# Patient Record
Sex: Female | Born: 1935 | Race: Black or African American | Hispanic: No | Marital: Single | Smoking: Never smoker
Health system: Southern US, Community
[De-identification: ages and names within clinical notes are randomized; demographics above are authoritative.]

## PROBLEM LIST (undated history)

## (undated) DIAGNOSIS — E669 Obesity, unspecified: Secondary | ICD-10-CM

## (undated) DIAGNOSIS — I509 Heart failure, unspecified: Secondary | ICD-10-CM

## (undated) DIAGNOSIS — E8881 Metabolic syndrome: Secondary | ICD-10-CM

## (undated) DIAGNOSIS — K219 Gastro-esophageal reflux disease without esophagitis: Secondary | ICD-10-CM

## (undated) DIAGNOSIS — R42 Dizziness and giddiness: Secondary | ICD-10-CM

## (undated) DIAGNOSIS — R011 Cardiac murmur, unspecified: Secondary | ICD-10-CM

## (undated) DIAGNOSIS — I10 Essential (primary) hypertension: Secondary | ICD-10-CM

## (undated) DIAGNOSIS — E7800 Pure hypercholesterolemia, unspecified: Secondary | ICD-10-CM

## (undated) DIAGNOSIS — I82409 Acute embolism and thrombosis of unspecified deep veins of unspecified lower extremity: Secondary | ICD-10-CM

## (undated) DIAGNOSIS — J301 Allergic rhinitis due to pollen: Secondary | ICD-10-CM

## (undated) DIAGNOSIS — I4891 Unspecified atrial fibrillation: Secondary | ICD-10-CM

## (undated) DIAGNOSIS — E88819 Insulin resistance, unspecified: Secondary | ICD-10-CM

## (undated) HISTORY — DX: Metabolic syndrome: E88.81

## (undated) HISTORY — DX: Insulin resistance, unspecified: E88.819

## (undated) HISTORY — DX: Allergic rhinitis due to pollen: J30.1

## (undated) HISTORY — DX: Dizziness and giddiness: R42

## (undated) HISTORY — PX: BREAST BIOPSY: SHX20

## (undated) HISTORY — DX: Heart failure, unspecified: I50.9

## (undated) HISTORY — PX: APPENDECTOMY: SHX54

## (undated) HISTORY — PX: PARTIAL HYSTERECTOMY: SHX80

## (undated) HISTORY — PX: CHOLECYSTECTOMY: SHX55

## (undated) HISTORY — DX: Obesity, unspecified: E66.9

## (undated) HISTORY — PX: ANKLE FRACTURE SURGERY: SHX122

## (undated) HISTORY — DX: Unspecified atrial fibrillation: I48.91

## (undated) HISTORY — DX: Pure hypercholesterolemia, unspecified: E78.00

## (undated) HISTORY — DX: Essential (primary) hypertension: I10

## (undated) HISTORY — DX: Acute embolism and thrombosis of unspecified deep veins of unspecified lower extremity: I82.409

---

## 1998-01-19 ENCOUNTER — Ambulatory Visit (HOSPITAL_COMMUNITY): Admission: RE | Admit: 1998-01-19 | Discharge: 1998-01-19 | Payer: Self-pay | Admitting: Internal Medicine

## 1998-01-31 ENCOUNTER — Ambulatory Visit (HOSPITAL_COMMUNITY): Admission: RE | Admit: 1998-01-31 | Discharge: 1998-01-31 | Payer: Self-pay | Admitting: Internal Medicine

## 2000-04-24 ENCOUNTER — Encounter: Admission: RE | Admit: 2000-04-24 | Discharge: 2000-04-24 | Payer: Self-pay | Admitting: Internal Medicine

## 2000-04-24 ENCOUNTER — Other Ambulatory Visit: Admission: RE | Admit: 2000-04-24 | Discharge: 2000-04-24 | Payer: Self-pay | Admitting: Internal Medicine

## 2000-04-24 ENCOUNTER — Encounter: Payer: Self-pay | Admitting: Internal Medicine

## 2000-05-01 ENCOUNTER — Encounter: Payer: Self-pay | Admitting: Internal Medicine

## 2000-05-01 ENCOUNTER — Encounter: Admission: RE | Admit: 2000-05-01 | Discharge: 2000-05-01 | Payer: Self-pay | Admitting: Internal Medicine

## 2000-07-10 ENCOUNTER — Encounter (INDEPENDENT_AMBULATORY_CARE_PROVIDER_SITE_OTHER): Payer: Self-pay | Admitting: *Deleted

## 2000-07-10 ENCOUNTER — Ambulatory Visit (HOSPITAL_COMMUNITY): Admission: RE | Admit: 2000-07-10 | Discharge: 2000-07-10 | Payer: Self-pay | Admitting: Gastroenterology

## 2001-10-12 ENCOUNTER — Encounter: Admission: RE | Admit: 2001-10-12 | Discharge: 2002-01-10 | Payer: Self-pay | Admitting: Internal Medicine

## 2002-03-09 ENCOUNTER — Encounter: Payer: Self-pay | Admitting: Internal Medicine

## 2002-03-09 ENCOUNTER — Encounter: Admission: RE | Admit: 2002-03-09 | Discharge: 2002-03-09 | Payer: Self-pay | Admitting: Internal Medicine

## 2002-04-26 ENCOUNTER — Encounter: Payer: Self-pay | Admitting: Internal Medicine

## 2002-04-26 ENCOUNTER — Encounter: Admission: RE | Admit: 2002-04-26 | Discharge: 2002-04-26 | Payer: Self-pay | Admitting: Internal Medicine

## 2002-05-13 ENCOUNTER — Encounter: Admission: RE | Admit: 2002-05-13 | Discharge: 2002-08-11 | Payer: Self-pay | Admitting: Family Medicine

## 2004-04-12 ENCOUNTER — Ambulatory Visit (HOSPITAL_COMMUNITY): Admission: RE | Admit: 2004-04-12 | Discharge: 2004-04-12 | Payer: Self-pay | Admitting: Gastroenterology

## 2005-01-18 ENCOUNTER — Encounter: Admission: RE | Admit: 2005-01-18 | Discharge: 2005-01-18 | Payer: Self-pay | Admitting: Internal Medicine

## 2005-08-16 ENCOUNTER — Other Ambulatory Visit: Admission: RE | Admit: 2005-08-16 | Discharge: 2005-08-16 | Payer: Self-pay | Admitting: Internal Medicine

## 2006-03-07 ENCOUNTER — Encounter: Admission: RE | Admit: 2006-03-07 | Discharge: 2006-03-07 | Payer: Self-pay | Admitting: Internal Medicine

## 2006-05-02 ENCOUNTER — Encounter: Payer: Self-pay | Admitting: Vascular Surgery

## 2006-05-02 ENCOUNTER — Ambulatory Visit (HOSPITAL_COMMUNITY): Admission: RE | Admit: 2006-05-02 | Discharge: 2006-05-02 | Payer: Self-pay | Admitting: Internal Medicine

## 2007-06-20 ENCOUNTER — Emergency Department (HOSPITAL_COMMUNITY): Admission: EM | Admit: 2007-06-20 | Discharge: 2007-06-20 | Payer: Self-pay | Admitting: Emergency Medicine

## 2007-10-06 ENCOUNTER — Ambulatory Visit (HOSPITAL_COMMUNITY): Admission: RE | Admit: 2007-10-06 | Discharge: 2007-10-06 | Payer: Self-pay | Admitting: Internal Medicine

## 2008-10-13 ENCOUNTER — Ambulatory Visit (HOSPITAL_COMMUNITY): Admission: RE | Admit: 2008-10-13 | Discharge: 2008-10-13 | Payer: Self-pay | Admitting: Internal Medicine

## 2008-11-17 ENCOUNTER — Encounter: Admission: RE | Admit: 2008-11-17 | Discharge: 2008-11-17 | Payer: Self-pay | Admitting: Internal Medicine

## 2009-10-23 ENCOUNTER — Ambulatory Visit (HOSPITAL_COMMUNITY): Admission: RE | Admit: 2009-10-23 | Discharge: 2009-10-23 | Payer: Self-pay | Admitting: Internal Medicine

## 2010-11-09 ENCOUNTER — Ambulatory Visit (HOSPITAL_COMMUNITY)
Admission: RE | Admit: 2010-11-09 | Discharge: 2010-11-09 | Payer: Self-pay | Source: Home / Self Care | Admitting: Internal Medicine

## 2011-03-21 ENCOUNTER — Encounter: Payer: Self-pay | Admitting: Internal Medicine

## 2011-03-26 ENCOUNTER — Ambulatory Visit
Admission: RE | Admit: 2011-03-26 | Discharge: 2011-03-26 | Disposition: A | Discharge status: Home / Self Care | Payer: Medicare Other | Source: Ambulatory Visit | Attending: Internal Medicine | Admitting: Internal Medicine

## 2011-03-26 ENCOUNTER — Other Ambulatory Visit: Payer: Self-pay | Admitting: Internal Medicine

## 2011-03-26 DIAGNOSIS — R52 Pain, unspecified: Secondary | ICD-10-CM

## 2011-04-26 NOTE — Op Note (Signed)
NAME:  Jasmine [name], Jasmine [name]                        ACCOUNT NO.:  [id number]   MEDICAL RECORD NO.:  [id number]                   PATIENT TYPE:  AMB   LOCATION:  ENDO                                 FACILITY:  [facility]   PHYSICIAN:  Ronald Lobo, M.D.                DATE OF BIRTH:  [birth year]   DATE OF PROCEDURE:  04/12/2004  DATE OF DISCHARGE:                                 OPERATIVE REPORT   PROCEDURE:  Colonoscopy.   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   FINDINGS:  Minimal diverticulosis.   DESCRIPTION OF PROCEDURE:  The nature, purpose and risk of the procedure  were familiar to the patient from prior examination and she provided written  consent. Sedation was fentanyl 75 mcg and Versed 8 mg IV without arrhythmias  other than some transient sinus arrhythmia or perhaps an occasional dropped  beat.  No clinical instability, no desaturation.   The Olympus adult video colonoscope was used for this exam and things went  smoother than on her exam three years ago when there was a lot of looping  with the pediatric scope.  I did have to go around a tight angulation in the  sigmoid region and had to take out loops during advancement but then things  went easily through the cecum which was identified by clear visualization of  the appendiceal orifice.  Pullback was then performed. The quality of the  prep was excellent and it is felt that all areas were well seen.   There was some mild diverticulosis noted during insertion of the scope but  not really visible during extraction.   No polyps, cancer, colitis or vascular malformations were observed.  Retroflexion in the rectum and reinspection of the rectum was unremarkable.  No biopsies were obtained. The patient tolerated the procedure well and  there were no apparent complications.   Note that the site of the rectal polypectomy where the carcinoid tumor was  removed, at the  time of her colonoscopy three years ago, looked intact.  There was a white scar there but no evidence of any residual tissue.   IMPRESSION:  1. Prior history of benign colonic polypoid grown, without any current     polyps identified (V12.72).  2. Mild diverticulosis.   PLAN:  Followup colonoscopy in five years.                                               Ronald Lobo, M.D.    RB/MEDQ  D:  04/12/2004  T:  04/12/2004  Job:  [id number]   cc:   Randall Hiss L. Marlou Sa, M.D.  [address] [place] [address]  Fax: [phone]

## 2011-04-26 NOTE — Procedures (Signed)
[facility]  Patient:    Jasmine [name], Jasmine [name]                     MRN: [id number] Proc. Date: 07/10/00 Adm. Date:  [id number] Disc. Date: [id number] Attending:  Ernie Avena CC:         Carolann Littler. Marlou Sa, M.D.   Procedure Report  PROCEDURE:  Colonoscopy with polypectomy.  ENDOSCOPIST:  Cleotis Nipper, M.D.  INDICATIONS:  Intermittent small volume hematochezia in a 77 year old female, also for colon cancer screening.  FINDINGS:  A 6.0 mm sessile rectal polyp removed by snare technique.  Rare right-sided diverticulum, otherwise normal examination.  DESCRIPTION OF PROCEDURE:  The nature, purpose, and risks of the procedure have been discussed with the patient who provided a written consent.  Sedation was fentanyl 100 mcg and Versed 7 mg IV, without arrhythmias or desaturation. The Olympus pediatric video colonoscope was advanced with some difficulty, due to looping and fairly sharp angulation, especially at the hepatic flexure, but with having the patient in the supine position, and applying some external abdominal compression, we were eventually able to override some loops, get around the hepatic flexure, and advance to the cecum, as identified by the visualization of the appendiceal orifice and the absence of further lumen.  A pullback was then performed.  The quality of the prep was excellent.  It is felt that all areas were well-seen.  There was a 6.0 mm sessile polyp which I snared off at about 8.0 cm from the external anal opening.  No other polyps were seen, and there was no evidence of cancer, colitis, or vascular malformations.  Retroflexion in the rectum prior to the retroflexion was not really successful, in that the view was obscured by one of the valves of [place].  Antegrade viewing disclosed just a slight loss of vascularity in the distal rectum, but no really worrisome abnormalities such as granularity or exudate.  The polyp  was retrieved by withdrawing it along the scope, out through the anus.  It was sent for histologic analysis.  The patient tolerated this procedure well, and there were no apparent complications.  It should be noted that the eschar from the polypectomy was relatively deep, but did not appear to be excessively deep.  IMPRESSION: 1. Rectal polyp, snared from the midrectum. 2. Rare right-sided diverticula.  PLAN:  Await pathology on the polyp.  The source of the patients intermittent rectal bleeding is felt to most likely be hemorrhoids or an anal fissure, since the polyp did not look hemorrhagic in character, and no other obvious source of hematochezia was identified. DD:  07/10/00 TD:  07/12/00 Job: [id number]

## 2011-09-24 LAB — DIFFERENTIAL
Basophils Relative: 0
Eosinophils Absolute: 0.1
Lymphocytes Relative: 22
Lymphs Abs: 1.4
Monocytes Absolute: 0.3
Neutro Abs: 4.5
Neutrophils Relative %: 71

## 2011-09-24 LAB — I-STAT 8, (EC8 V) (CONVERTED LAB)
Acid-Base Excess: 2
Bicarbonate: 24.7 — ABNORMAL HIGH
Chloride: 106
HCT: 37
Hemoglobin: 12.6
Operator id: 257131
Potassium: 4.4
TCO2: 26
pCO2, Ven: 30.2 — ABNORMAL LOW
pH, Ven: 7.52 — ABNORMAL HIGH

## 2011-09-24 LAB — POCT I-STAT CREATININE
Creatinine, Ser: 1.1
Operator id: 257131

## 2011-09-24 LAB — CBC
MCHC: 34.2
MCV: 91.3
Platelets: 190

## 2011-12-23 ENCOUNTER — Inpatient Hospital Stay (HOSPITAL_COMMUNITY)
Admission: EM | Admit: 2011-12-23 | Discharge: 2011-12-26 | DRG: 310 | Disposition: A | Discharge status: Home / Self Care | Payer: Medicare Other | Attending: Internal Medicine | Admitting: Internal Medicine

## 2011-12-23 ENCOUNTER — Other Ambulatory Visit: Payer: Self-pay

## 2011-12-23 ENCOUNTER — Emergency Department (HOSPITAL_COMMUNITY): Payer: Medicare Other

## 2011-12-23 ENCOUNTER — Encounter (HOSPITAL_COMMUNITY): Payer: Self-pay

## 2011-12-23 DIAGNOSIS — Z886 Allergy status to analgesic agent status: Secondary | ICD-10-CM

## 2011-12-23 DIAGNOSIS — D638 Anemia in other chronic diseases classified elsewhere: Secondary | ICD-10-CM | POA: Diagnosis present

## 2011-12-23 DIAGNOSIS — R040 Epistaxis: Secondary | ICD-10-CM | POA: Diagnosis present

## 2011-12-23 DIAGNOSIS — R42 Dizziness and giddiness: Secondary | ICD-10-CM

## 2011-12-23 DIAGNOSIS — I4891 Unspecified atrial fibrillation: Secondary | ICD-10-CM

## 2011-12-23 DIAGNOSIS — I509 Heart failure, unspecified: Secondary | ICD-10-CM | POA: Diagnosis present

## 2011-12-23 DIAGNOSIS — H8110 Benign paroxysmal vertigo, unspecified ear: Secondary | ICD-10-CM | POA: Diagnosis present

## 2011-12-23 DIAGNOSIS — I129 Hypertensive chronic kidney disease with stage 1 through stage 4 chronic kidney disease, or unspecified chronic kidney disease: Secondary | ICD-10-CM | POA: Diagnosis present

## 2011-12-23 DIAGNOSIS — Z79899 Other long term (current) drug therapy: Secondary | ICD-10-CM

## 2011-12-23 DIAGNOSIS — N181 Chronic kidney disease, stage 1: Secondary | ICD-10-CM | POA: Diagnosis present

## 2011-12-23 DIAGNOSIS — D649 Anemia, unspecified: Secondary | ICD-10-CM

## 2011-12-23 DIAGNOSIS — Z882 Allergy status to sulfonamides status: Secondary | ICD-10-CM

## 2011-12-23 DIAGNOSIS — E7800 Pure hypercholesterolemia, unspecified: Secondary | ICD-10-CM | POA: Diagnosis present

## 2011-12-23 DIAGNOSIS — J309 Allergic rhinitis, unspecified: Secondary | ICD-10-CM | POA: Diagnosis present

## 2011-12-23 DIAGNOSIS — Z7982 Long term (current) use of aspirin: Secondary | ICD-10-CM

## 2011-12-23 DIAGNOSIS — K219 Gastro-esophageal reflux disease without esophagitis: Secondary | ICD-10-CM | POA: Diagnosis present

## 2011-12-23 HISTORY — DX: Gastro-esophageal reflux disease without esophagitis: K21.9

## 2011-12-23 HISTORY — DX: Cardiac murmur, unspecified: R01.1

## 2011-12-23 HISTORY — DX: Unspecified atrial fibrillation: I48.91

## 2011-12-23 LAB — RETICULOCYTES
RBC.: 3.6 MIL/uL — ABNORMAL LOW (ref 3.87–5.11)
Retic Count, Absolute: 93.6 10*3/uL (ref 19.0–186.0)

## 2011-12-23 LAB — BASIC METABOLIC PANEL
Calcium: 9.4 mg/dL (ref 8.4–10.5)
Chloride: 101 mEq/L (ref 96–112)
Creatinine, Ser: 1.11 mg/dL — ABNORMAL HIGH (ref 0.50–1.10)
GFR calc Af Amer: 55 mL/min — ABNORMAL LOW (ref >90)
Potassium: 3.8 mEq/L (ref 3.5–5.1)
Sodium: 134 mEq/L — ABNORMAL LOW (ref 135–145)

## 2011-12-23 LAB — POCT I-STAT TROPONIN I: Troponin i, poc: 0 ng/mL (ref 0.00–0.08)

## 2011-12-23 LAB — T3, FREE: T3, Free: 2.5 pg/mL (ref 2.3–4.2)

## 2011-12-23 LAB — DIFFERENTIAL
Eosinophils Relative: 1 % (ref 0–5)
Lymphs Abs: 1.1 10*3/uL (ref 0.7–4.0)
Monocytes Absolute: 0.2 10*3/uL (ref 0.1–1.0)
Monocytes Relative: 3 % (ref 3–12)
Neutro Abs: 4.5 10*3/uL (ref 1.7–7.7)
Neutrophils Relative %: 78 % — ABNORMAL HIGH (ref 43–77)

## 2011-12-23 LAB — CBC
MCH: 30.4 pg (ref 26.0–34.0)
RBC: 3.58 MIL/uL — ABNORMAL LOW (ref 3.87–5.11)
RDW: 14.1 % (ref 11.5–15.5)

## 2011-12-23 LAB — PRO B NATRIURETIC PEPTIDE: Pro B Natriuretic peptide (BNP): 1113 pg/mL — ABNORMAL HIGH (ref 0–450)

## 2011-12-23 LAB — HEPATIC FUNCTION PANEL
ALT: 9 U/L (ref 0–35)
AST: 15 U/L (ref 0–37)
Albumin: 3.2 g/dL — ABNORMAL LOW (ref 3.5–5.2)
Bilirubin, Direct: 0.1 mg/dL (ref 0.0–0.3)
Total Bilirubin: 0.6 mg/dL (ref 0.3–1.2)

## 2011-12-23 LAB — T4, FREE: Free T4: 0.79 ng/dL — ABNORMAL LOW (ref 0.80–1.80)

## 2011-12-23 LAB — IRON AND TIBC
Iron: 34 ug/dL — ABNORMAL LOW (ref 42–135)
UIBC: 283 ug/dL (ref 125–400)

## 2011-12-23 LAB — TSH: TSH: 2.795 u[IU]/mL (ref 0.350–4.500)

## 2011-12-23 LAB — FERRITIN: Ferritin: 326 ng/mL — ABNORMAL HIGH (ref 10–291)

## 2011-12-23 LAB — FOLATE: Folate: 13.4 ng/mL

## 2011-12-23 MED ORDER — ONDANSETRON HCL 4 MG PO TABS: 4.0000 mg | ORAL_TABLET | Freq: Four times a day (QID) | ORAL | Status: DC | PRN

## 2011-12-23 MED ORDER — ONDANSETRON HCL 4 MG/2ML IJ SOLN
4.0000 mg | Freq: Once | INTRAMUSCULAR | Status: AC
  Administered 2011-12-23: 4 mg via INTRAVENOUS
  Filled 2011-12-23: qty 2

## 2011-12-23 MED ORDER — ATENOLOL 100 MG PO TABS
100.0000 mg | ORAL_TABLET | Freq: Every day | ORAL | Status: DC
  Administered 2011-12-24: 100 mg via ORAL
  Filled 2011-12-23 (×2): qty 1

## 2011-12-23 MED ORDER — LORATADINE 10 MG PO TABS
10.0000 mg | ORAL_TABLET | Freq: Every day | ORAL | Status: DC
  Administered 2011-12-24 – 2011-12-26 (×3): 10 mg via ORAL
  Filled 2011-12-23 (×4): qty 1

## 2011-12-23 MED ORDER — SODIUM CHLORIDE 0.9 % IV BOLUS (SEPSIS)
500.0000 mL | Freq: Once | INTRAVENOUS | Status: AC
  Administered 2011-12-23: 500 mL via INTRAVENOUS

## 2011-12-23 MED ORDER — MECLIZINE HCL 12.5 MG PO TABS
12.5000 mg | ORAL_TABLET | Freq: Three times a day (TID) | ORAL | Status: DC | PRN
  Filled 2011-12-23: qty 1

## 2011-12-23 MED ORDER — OXYCODONE HCL 5 MG PO TABS: 5.0000 mg | ORAL_TABLET | Freq: Four times a day (QID) | ORAL | Status: DC | PRN

## 2011-12-23 MED ORDER — ASPIRIN 325 MG PO TABS
325.0000 mg | ORAL_TABLET | Freq: Every day | ORAL | Status: DC
  Administered 2011-12-23 – 2011-12-26 (×4): 325 mg via ORAL
  Filled 2011-12-23 (×4): qty 1

## 2011-12-23 MED ORDER — ACETAMINOPHEN 650 MG RE SUPP: 650.0000 mg | Freq: Four times a day (QID) | RECTAL | Status: DC | PRN

## 2011-12-23 MED ORDER — ATENOLOL 50 MG PO TABS
50.0000 mg | ORAL_TABLET | Freq: Every day | ORAL | Status: DC
  Administered 2011-12-23 – 2011-12-24 (×2): 50 mg via ORAL
  Filled 2011-12-23 (×3): qty 1

## 2011-12-23 MED ORDER — ENOXAPARIN SODIUM 40 MG/0.4ML ~~LOC~~ SOLN
40.0000 mg | SUBCUTANEOUS | Status: DC
  Administered 2011-12-24 – 2011-12-25 (×2): 40 mg via SUBCUTANEOUS
  Filled 2011-12-23 (×4): qty 0.4

## 2011-12-23 MED ORDER — MECLIZINE HCL 25 MG PO TABS
50.0000 mg | ORAL_TABLET | Freq: Once | ORAL | Status: AC
  Administered 2011-12-23: 50 mg via ORAL
  Filled 2011-12-23: qty 2

## 2011-12-23 MED ORDER — ACETAMINOPHEN 325 MG PO TABS: 650.0000 mg | ORAL_TABLET | Freq: Four times a day (QID) | ORAL | Status: DC | PRN

## 2011-12-23 MED ORDER — LEVALBUTEROL HCL 0.63 MG/3ML IN NEBU
0.6300 mg | INHALATION_SOLUTION | Freq: Four times a day (QID) | RESPIRATORY_TRACT | Status: DC
  Administered 2011-12-23 – 2011-12-24 (×2): 0.63 mg via RESPIRATORY_TRACT
  Filled 2011-12-23 (×7): qty 3

## 2011-12-23 MED ORDER — DOCUSATE SODIUM 100 MG PO CAPS
100.0000 mg | ORAL_CAPSULE | Freq: Two times a day (BID) | ORAL | Status: DC
  Administered 2011-12-23 – 2011-12-26 (×6): 100 mg via ORAL
  Filled 2011-12-23 (×7): qty 1

## 2011-12-23 MED ORDER — ONDANSETRON HCL 4 MG/2ML IJ SOLN: 4.0000 mg | Freq: Four times a day (QID) | INTRAMUSCULAR | Status: DC | PRN

## 2011-12-23 MED ORDER — DIAZEPAM 5 MG/ML IJ SOLN
2.5000 mg | Freq: Once | INTRAMUSCULAR | Status: AC
  Administered 2011-12-23: 2.5 mg via INTRAVENOUS
  Filled 2011-12-23: qty 2

## 2011-12-23 MED ORDER — OLMESARTAN 10 MG HALF TABLET
10.0000 mg | ORAL_TABLET | Freq: Every day | ORAL | Status: DC
  Administered 2011-12-23 – 2011-12-26 (×3): 10 mg via ORAL
  Filled 2011-12-23 (×4): qty 1

## 2011-12-23 MED ORDER — SALINE NASAL SPRAY 0.65 % NA SOLN
1.0000 | Freq: Every day | NASAL | Status: DC | PRN
  Filled 2011-12-23: qty 0.5

## 2011-12-23 MED ORDER — SODIUM CHLORIDE 0.9 % IV SOLN
INTRAVENOUS | Status: AC
  Administered 2011-12-23: 21:00:00 via INTRAVENOUS

## 2011-12-23 MED ORDER — GADOBENATE DIMEGLUMINE 529 MG/ML IV SOLN
15.0000 mL | Freq: Once | INTRAVENOUS | Status: AC
  Administered 2011-12-23: 15 mL via INTRAVENOUS

## 2011-12-23 NOTE — Consult Note (Signed)
CARDIOLOGY CONSULT NOTE   Patient ID: Jasmine [name] MRN: [id number] DOB/AGE: 07-25-1934 78 y.o.  Admit date: 12/23/2011  Primary Physician   Dr Marlou Sa Primary Cardiologist   None  Reason for Consultation   Atrial fib  [id number] Jasmine [name] is a 78 y.o. female with no history of CAD.   She was in her usual state of health yesterday. She woke at approximately 1 AM with severe dizziness. She also had nausea and vomiting. She felt very weak. The dizziness was made worse by any movement, especially of her head. She blew her nose and had some epistaxis from the left nostril which she has had before. She did not have chest pain or shortness of breath. She had no awareness of an irregular or rapid heart rate. She did not have any pain. When she was unable to walk because of the dizziness and weakness, her sister called EMS. She was found to be in atrial fibrillation with rapid ventricular response. The emergency room is also evaluating her for possible CVA. She has had 500cc IVF, Zofran and Antivert 50mg . Cardiology was asked to evaluate her.  Jasmine [name] has never had any chest pain. She is active around her home and does exercise as well as goes dancing and has no problems with shortness of breath or chest pain. She has rare palpitations but no symptoms from them, and never had any problems with dizziness until today. Her symptoms are slightly improved but she still has significant dizziness with any movement.   Past Medical History  Diagnosis Date  . Allergic rhinitis due to pollen   . Essential hypertension, malignant   . Congestive heart failure, unspecified   . Pure hypercholesterolemia     Past Surgical History  Procedure Date  . Cholecystectomy   . Partial hysterectomy     Allergies  Allergen Reactions  . Codeine   . Sulfa Antibiotics    I have reviewed the patient's current medications. Prior to Admission:  1. Atenolol 100 mg daily 2. Allegra 180 mg daily 3. Avapro 300 mg  daily 4. Saline nasal spray to as needed 5. Maxzide 75/50 mg daily  Scheduled Meds:   . meclizine  50 mg Oral Once  . ondansetron  4 mg Intravenous Once  . sodium chloride  500 mL Intravenous Once    History   Social History  . Marital Status: Single    Spouse Name: N/A    Number of Children: N/A  . Years of Education: N/A   Occupational History  .  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    Social History Main Topics  . Smoking status: Never Smoker   . Smokeless tobacco: Not on file  . Alcohol Use: Never  . Drug Use: No  . Sexually Active:    Social History Narrative  .  She lives with her sister. She drives. She has no problems taking care of her needs.     Family history. Both parents are deceased. Her father died from complications of diabetes. Her mother died at 69 with congestive heart failure. Neither the patient, her twin sister, or any other siblings have cardiac issues.  ROS: She has occasional nosebleeds from her left nostril when she sneezes or blows nose. These are helped somewhat by the saline nasal spray. She feels that this happens more often during her seasonal allergy flares. She has occasional musculoskeletal aches and pains. She had reflux symptoms in the past  and took medication for while but stated she made dietary changes and now no longer has any symptoms except when she eats spicy foods. She has never had melena, hematemesis or hemoptysis. She has not had any recent illnesses fevers or chills. She does not have history of presyncope or syncope. 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 Full 14 point review of systems complete and found to be negative unless listed  above  Physical Exam: Blood pressure 132/75, pulse 77, temperature 97.5 F (36.4 C), temperature source Oral, resp. rate 23, SpO2 100.00%.   General: Well developed, well nourished, elderly African-American female in no acute distress Head:  Eyes PERRLA, No xanthomas.   Normocephalic and atraumatic, oropharynx without edema or exudate. Ptosis noted on right eye. Dentition good Lungs: Clear bilaterally to auscultation  Heart:  Heart irregular rate and rhythm with S1, S2, no significant murmur, rub or gallop. pulses are 2+ & equal all 4 extrem.   Neck: No carotid bruit bilaterally.   No lymphadenopathy.  JVD at 6 cm. Abdomen: Bowel sounds present, abdomen soft and non-tender without masses or hernias noted. Msk:  No spine or cva tenderness. Normal strength and tone for age, no joint deformities or effusions. Extremities: No clubbing or cyanosis.  No edema.  Neuro: Alert and oriented X 3. No focal deficits noted. Psych:  Good affect, responds appropriately Skin: No rashes or lesions noted.  Labs:   Lab Results  Component Value Date   WBC 5.8 12/23/2011   HGB 10.9* 12/23/2011   HCT 32.5* 12/23/2011   MCV 90.8 12/23/2011   PLT 177 12/23/2011    Lab 12/23/11 0903  NA 134*  K 3.8  CL 101  CO2 18*  BUN 26*  CREATININE 1.11*  CALCIUM 9.4  PROT --  BILITOT --  ALKPHOS --  ALT --  AST --  GLUCOSE 177*   No results found for this basename: CKTOTAL:4,CKMB:4,TROPONINI:4 in the last 72 hours No components found with this basename: POCBNP:3 No results found for this basename: CHOL, HDL, LDLCALC, TRIG   No results found for this basename: DDIMER   No results found for this basename: Lipase, amylase   No results found for this basename: tsh, t4total, freet3, t68free, thyroidab   No results found for this basename: VitaminB12, Folate, Ferritin, TIBC, Iron, reticctpct   Radiology:  Dg Chest Portable 1 View  12/23/2011  *RADIOLOGY REPORT*  Clinical Data: Atrial fibrillation.  Dizziness. Nausea and vomiting.  Epistaxis.  PORTABLE CHEST - 1 VIEW  Comparison: 11/17/2008  Findings: Low lung volumes again demonstrated.  Both lungs remain clear.  Heart size is stable.  No evidence of pleural effusion. Patient is partially rotated to the  left.  IMPRESSION: Low lung volumes.  No acute findings.  Original Report Authenticated By: Marlaine Hind, M.D.    EKG:  Atrial fibrillation, rate 109 with no acute ischemic changes. Previous ECG from 2008 with sinus rhythm  ASSESSMENT AND PLAN:   The patient was seen today by Dr Stanford Breed, the patient evaluated and the data reviewed.  1. rapid atrial fibrillation: The patient chronically takes atenolol 100 mg daily. Consider changing this to Lopressor 25 mg every 6 hours. She can also have IV Cardizem, titrated for better rate control. The duration is unclear but if the atrial fibrillation is causing her dizziness, it is less than 12 hours old. Need thyroid function tests. 2. Dizziness: She has an MRI of the brain ordered for evaluation of possible  CVA but the suspicion is not strong. She has been started on Antivert. We will hold the diuretic for now. The symptoms are possibly vestibular in origin if they are not secondary to her atrial fibrillation. 3. Anticoagulation: She would be a candidate for anticoagulation with heparin/Coumadin or Pradaxa/Xarelto if the MRI is negative and if she has no active bleeding issues. 4. Anemia - will do Iron studies and guiac stools. With her epistaxis history, may need ENT to see.  Signed: Rosaria Ferries 12/23/2011, 10:49 AM Co-Sign MD As above 78 year old female with no prior cardiac history for evaluation of atrial fibrillation. She typically does not have dyspnea on exertion, orthopnea, PND or chest pain. She occasionally feels brief palpitations that this does not appear to be significantly problematic. At approximately 1 AM today she awoke with acute dizziness followed by nausea and vomiting. Her dizziness increased with movements and she describes the room as "spinning". She has not had fevers, chills, cough, chest pain, palpitations or dyspnea. She denies loss of strength or sensation in her extremities. She presented to the emergency room and was noted to be  in atrial fibrillation and cardiology was asked to evaluate. Her physical exam is significant for an irregular heart rhythm. There are no gross neurological findings. She does have ptosis chronically of her right eye. Her laboratories are significant for a normocytic anemia. Her electrocardiogram shows atrial fibrillation at a rate of 109. Patient converted to sinus rhythm during my examination. Plan to continue atenolol which she takes for her blood pressure but increase dose to 100 mg in the morning and 50 mg in the evening for improved rate control. Outpatient echocardiogram to quantify LV function. Check TSH. She has embolic risk factors of hypertension, female sex, and age greater than 77. She therefore will need long-term anticoagulation. However she has significant problems with recurrent nosebleeds. I will have her evaluated by ENT to see if there is a lesion that will require cauterization. We will also check anemia labs given her baseline normocytic anemia. Note she denies melena or hematochezia. Check stool hemocults. Once these issues are resolved we will begin also 20 mg daily. For now I will treat with aspirin 81 mg daily. She will followup with me in the office 2-4 weeks from now. Note she is felt to most likely have vertigo. However an MRI has been scheduled to exclude CVA. If she indeed does have note of a CVA on her MRI then she will need admission and evaluation by neurology. Kirk Ruths

## 2011-12-23 NOTE — ED Notes (Signed)
[id number] Ready

## 2011-12-23 NOTE — ED Provider Notes (Signed)
Medical screening examination/treatment/procedure(s) were conducted as a shared visit with non-physician practitioner(s) and myself.  I personally evaluated the patient during the encounter  Patient presents with new onset atrial fibrillation. She does have complaints of dizziness as well. The symptoms increase with movement. They're better when she is at rest. Patient does feel later worse when her heart is racing however they're not resolved at this time even though her heart rate is not tachycardic. With her new onset A. fib the possibility of stroke causing her  Dizziness is a concern. The patient will be admitted for further evaluation of her new onset atrial fibrillation. We will proceed with MRI as well of the brain to rule out acute stroke.  Kathalene Frames, MD 12/23/11 1020

## 2011-12-23 NOTE — ED Notes (Signed)
Patient transported to MRI 

## 2011-12-23 NOTE — ED Notes (Signed)
Patient states that she woke up this morning with severe dizziness and nausea. She stated that she felt like her heart was racing at times. She states that she also had a minor nosebleed pta that had resolved upon arrival. Pt denies any pain. She states that she mainly feels very dizzy and nauseated. She describes the dizziness as the room is moving. Alert and oriented. Family at the bedside. Iv started and protocols initiated. Pt is in a-fib rvr upon arrival to dept.

## 2011-12-23 NOTE — H&P (Signed)
Jasmine [name] MRN: [id number] DOB/AGE: 12-Mar-1934 78 y.o. Primary Care Physician: Dr. Kevan Ny Admit date: 12/23/2011 Chief Complaint: Shortness of breath and dizziness HPI:78 year old female with no prior cardiac history for evaluation of atrial fibrillation. She typically does not have dyspnea on exertion, orthopnea, PND or chest pain. She occasionally feels brief palpitations that this does not appear to be significantly problematic. At approximately 1 AM today she awoke with acute dizziness followed by nausea and vomiting. Her dizziness increased with movements and she describes the room as "spinning". She has not had fevers, chills, cough, chest pain, palpitations or dyspnea. She denies loss of strength or sensation in her extremities. She presented to the emergency room and was noted to be in atrial fibrillation and cardiology was asked to evaluate. Her physical exam is significant for an irregular heart rhythm. There are no gross neurological findings. She does have ptosis chronically of her right eye. Her laboratories are significant for a normocytic anemia. Her electrocardiogram shows atrial fibrillation at a rate of 109. Patient converted to sinus rhythm in the ER.     Past Medical History  Diagnosis Date  . Allergic rhinitis due to pollen   . Essential hypertension, malignant   . Congestive heart failure, unspecified   . Pure hypercholesterolemia   . Heart murmur   . GERD (gastroesophageal reflux disease)     Past Surgical History  Procedure Date  . Cholecystectomy   . Partial hysterectomy   . Appendectomy   . Breast biopsy     Prior to Admission medications   Medication Sig Start Date End Date Taking? Authorizing Provider  atenolol (TENORMIN) 100 MG tablet Take 100 mg by mouth daily.     Yes Historical Provider, MD  fexofenadine (ALLEGRA) 180 MG tablet Take 180 mg by mouth daily as needed. For allergies    Yes Historical Provider, MD  irbesartan (AVAPRO) 300 MG tablet  Take 300 mg by mouth daily.     Yes Historical Provider, MD  sodium chloride (OCEAN) 0.65 % nasal spray Place 1 spray into both nostrils daily as needed. For allergies   Yes Historical Provider, MD  triamterene-hydrochlorothiazide (MAXZIDE) 75-50 MG per tablet Take 1 tablet by mouth daily.     Yes Historical Provider, MD    Allergies:  Allergies  Allergen Reactions  . Codeine   . Sulfa Antibiotics     History reviewed. No pertinent family history.  Social History   .  Marital Status:  Single     Spouse Name:  N/A     Number of Children:  N/A   .  Years of Education:  N/A    Occupational History   .  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    Social History Main Topics   .  Smoking status:  Never Smoker   .  Smokeless tobacco:  Not on file   .  Alcohol Use:  Never   .  Drug Use:  No   .  Sexually Active:     Social History Narrative   .  She lives with her sister. She drives. She has no problems taking care of her needs.     ROS: A complete 14 point review of systems was done with pertinent positives listed in history of present illness PHYSICAL EXAM: Blood pressure 144/64, pulse 70, temperature 98 F (36.7 C), temperature source Oral, resp. rate 23, SpO2 99.00%. General: Well developed, well nourished, elderly African-American female in  no acute distress  Head: Eyes PERRLA, No xanthomas. Normocephalic and atraumatic, oropharynx without edema or exudate. Ptosis noted on right eye. Dentition good  Lungs: Clear bilaterally to auscultation  Heart: Heart irregular rate and rhythm with S1, S2, no significant murmur, rub or gallop. pulses are 2+ & equal all 4 extrem.  Neck: No carotid bruit bilaterally. No lymphadenopathy. JVD at 6 cm.  Abdomen: Bowel sounds present, abdomen soft and non-tender without masses or hernias noted.  Msk: No spine or cva tenderness. Normal strength and tone for age, no joint deformities or effusions.  Extremities: No  clubbing or cyanosis. No edema.  Neuro: Alert and oriented X 3. No focal deficits noted.  Psych: Good affect, responds appropriately  Skin: No rashes or lesions noted.   EKG: Atrial fibrillation, rate 109 with no acute ischemic changes. Previous ECG from 2008 with sinus rhythm      No results found for this or any previous visit (from the past 240 hour(s)).   Results for orders placed during the hospital encounter of 12/23/11 (from the past 48 hour(s))  POCT I-STAT TROPONIN I     Status: Normal   Collection Time   12/23/11  8:56 AM      Component Value Range Comment   Troponin i, poc 0.00  0.00 - 0.08 (ng/mL)    Comment 3            CBC     Status: Abnormal   Collection Time   12/23/11  9:03 AM      Component Value Range Comment   WBC 5.8  4.0 - 10.5 (K/uL)    RBC 3.58 (*) 3.87 - 5.11 (MIL/uL)    Hemoglobin 10.9 (*) 12.0 - 15.0 (g/dL)    HCT 32.5 (*) 36.0 - 46.0 (%)    MCV 90.8  78.0 - 100.0 (fL)    MCH 30.4  26.0 - 34.0 (pg)    MCHC 33.5  30.0 - 36.0 (g/dL)    RDW 14.1  11.5 - 15.5 (%)    Platelets 177  150 - 400 (K/uL)   DIFFERENTIAL     Status: Abnormal   Collection Time   12/23/11  9:03 AM      Component Value Range Comment   Neutrophils Relative 78 (*) 43 - 77 (%)    Neutro Abs 4.5  1.7 - 7.7 (K/uL)    Lymphocytes Relative 18  12 - 46 (%)    Lymphs Abs 1.1  0.7 - 4.0 (K/uL)    Monocytes Relative 3  3 - 12 (%)    Monocytes Absolute 0.2  0.1 - 1.0 (K/uL)    Eosinophils Relative 1  0 - 5 (%)    Eosinophils Absolute 0.0  0.0 - 0.7 (K/uL)    Basophils Relative 1  0 - 1 (%)    Basophils Absolute 0.0  0.0 - 0.1 (K/uL)   BASIC METABOLIC PANEL     Status: Abnormal   Collection Time   12/23/11  9:03 AM      Component Value Range Comment   Sodium 134 (*) 135 - 145 (mEq/L)    Potassium 3.8  3.5 - 5.1 (mEq/L)    Chloride 101  96 - 112 (mEq/L)    CO2 18 (*) 19 - 32 (mEq/L)    Glucose, Bld 177 (*) 70 - 99 (mg/dL)    BUN 26 (*) 6 - 23 (mg/dL)    Creatinine, Ser 1.11 (*) 0.50  - 1.10 (mg/dL)  Calcium 9.4  8.4 - 10.5 (mg/dL)    GFR calc non Af Amer 47 (*) >90 (mL/min)    GFR calc Af Amer 55 (*) >90 (mL/min)   RETICULOCYTES     Status: Abnormal   Collection Time   12/23/11 12:13 PM      Component Value Range Comment   Retic Ct Pct 2.6  0.4 - 3.1 (%)    RBC. 3.60 (*) 3.87 - 5.11 (MIL/uL)    Retic Count, Manual 93.6  19.0 - 186.0 (K/uL)     Mr Jeri Cos Wo Contrast  12/23/2011  *RADIOLOGY REPORT*  Clinical Data: Atrial fibrillation.  Acute vertigo with nausea and vomiting.  MRI HEAD WITHOUT AND WITH CONTRAST  Technique:  Multiplanar, multiecho pulse sequences of the brain and surrounding structures were obtained according to standard protocol without and with intravenous contrast  Contrast: 63mL MULTIHANCE GADOBENATE DIMEGLUMINE 529 MG/ML IV SOLN  Comparison: 06/20/2007 CT.  Findings: No acute infarct.  No intracranial hemorrhage.  Mild global atrophy without hydrocephalus.  Minimal white matter type changes.  Diminutive size right vertebral artery.  Small air-fluid level left sphenoid sinus.  Minimal ethmoid sinus air cell opacification.  No intracranial mass or abnormal enhancement.  IMPRESSION: No acute infarct.  Please see above.  Original Report Authenticated By: Doug Sou, M.D.   Dg Chest Portable 1 View  12/23/2011  *RADIOLOGY REPORT*  Clinical Data: Atrial fibrillation.  Dizziness. Nausea and vomiting.  Epistaxis.  PORTABLE CHEST - 1 VIEW  Comparison: 11/17/2008  Findings: Low lung volumes again demonstrated.  Both lungs remain clear.  Heart size is stable.  No evidence of pleural effusion. Patient is partially rotated to the left.  IMPRESSION: Low lung volumes.  No acute findings.  Original Report Authenticated By: Marlaine Hind, M.D.    Impression:  #1 atrial fibrillation and has been seen by cardiology Dr. Kirk Ruths. Plan to continue atenolol which she takes for her blood pressure but increase dose to 100 mg in the morning and 50 mg in the evening  for improved rate control. Outpatient echocardiogram to quantify LV function. Check TSH. She has embolic risk factors of hypertension, female sex, and age greater than 56. Currently we will hold off on anticoagulation because of her epistaxis  #2 dizziness MRI was negative for CVA. For the time being we will place her on aspirin. Her epistaxis is being evaluated. She could also have benign positional vertigo. We will try some when necessary meclizine. We will also check orthostatics.  #3 anemia we'll check stool guaiacs and iron studies, anemia panel  #4 Epistaxis a CT scan of her sinuses is pending at this time, we will continue to use nasal saline spray. If this increases, could consider using Afrin nasal spray for 2-3 days.         Alec Mcphee 12/23/2011, 5:31 PM

## 2011-12-23 NOTE — ED Notes (Signed)
Cardiology at the bedside.

## 2011-12-23 NOTE — ED Notes (Signed)
Assisted pt on and off of bedpan.  Linens changed.  Pt denies any needs at this time.

## 2011-12-23 NOTE — ED Notes (Signed)
Pt states that she woke up this morning with severe nausea and dizziness. She states that she vomited pta. Pt is alert and oriented. She is coming from home. She stated that with onset she had feelings like her heart was racing at times. ems stated that she was nsr-a/fib rvr. ems also reported minor nose bleed at the scene.

## 2011-12-23 NOTE — ED Provider Notes (Signed)
History     CSN: [id number]  Arrival date & time 12/23/11  [id number]   First MD Initiated Contact with Patient 12/23/11 0827      Chief Complaint  Patient presents with  . Dizziness  . Emesis    (Consider location/radiation/quality/duration/timing/severity/associated sxs/prior treatment) Patient is a 78 y.o. female presenting with weakness. The history is provided by the patient.  Weakness The primary symptoms include dizziness, nausea and vomiting. Primary symptoms do not include headaches, syncope, loss of consciousness, altered mental status, visual change, focal weakness, speech change, memory loss or fever. The symptoms began 2 to 6 hours ago. The symptoms are unchanged.  Dizziness also occurs with nausea, vomiting and weakness. Dizziness does not occur with diaphoresis.  Additional symptoms include weakness.  Pt states she woke up this morning with dizziness, weakness, nausea. States vomited x1. States she did have some heart racing sensation. Denies headache, fever, chills, chest pain, abdominal pain, focal neurological weaknesses.  Past Medical History  Diagnosis Date  . Allergic rhinitis due to pollen   . Essential hypertension, malignant   . Congestive heart failure, unspecified   . Pure hypercholesterolemia     Past Surgical History  Procedure Date  . Cholecystectomy   . Partial hysterectomy     History reviewed. No pertinent family history.  History  Substance Use Topics  . Smoking status: Never Smoker   . Smokeless tobacco: Not on file  . Alcohol Use: No    OB History    Grav Para Term Preterm Abortions TAB SAB Ect Mult Living                  Review of Systems  Constitutional: Negative for fever, chills and diaphoresis.  HENT: Negative.   Eyes: Negative.   Respiratory: Negative.   Cardiovascular: Positive for palpitations. Negative for chest pain and syncope.  Gastrointestinal: Positive for nausea and vomiting. Negative for abdominal pain.    Genitourinary: Negative.   Musculoskeletal: Negative.   Skin: Negative.   Neurological: Positive for dizziness and weakness. Negative for speech change, focal weakness, loss of consciousness and headaches.  Psychiatric/Behavioral: Negative.  Negative for memory loss and altered mental status.    Allergies  Codeine; Norvasc; and Sulfa antibiotics  Home Medications   Current Outpatient Rx  Name Route Sig Dispense Refill  . ATENOLOL 100 MG PO TABS Oral Take 100 mg by mouth daily.      Marland Kitchen FEXOFENADINE HCL 180 MG PO TABS Oral Take 180 mg by mouth daily as needed. For allergies     . IRBESARTAN 300 MG PO TABS Oral Take 300 mg by mouth daily.      . TRIAMTERENE-HCTZ 75-50 MG PO TABS Oral Take 1 tablet by mouth daily.        There were no vitals taken for this visit.  Physical Exam  Nursing note and vitals reviewed. Constitutional: She is oriented to person, place, and time. She appears well-developed and well-nourished. No distress.  HENT:  Head: Normocephalic and atraumatic.  Right Ear: External ear normal.  Left Ear: External ear normal.  Mouth/Throat: Oropharynx is clear and moist.  Eyes: Conjunctivae and EOM are normal. Pupils are equal, round, and reactive to light.  Neck: Normal range of motion. Neck supple.  Cardiovascular: S1 normal and S2 normal.  An irregular rhythm present. Tachycardia present.   No murmur heard. Pulmonary/Chest: Effort normal and breath sounds normal. No respiratory distress. She has no wheezes.  Abdominal: Soft. Bowel sounds are normal. There is  no tenderness.  Musculoskeletal: Normal range of motion. She exhibits no edema.  Neurological: She is alert and oriented to person, place, and time. No cranial nerve deficit. Coordination normal.       Horizontal nystagmus. 5/5 equal grip strength. No pronator drift. 5/5 equal LE strength  Skin: Skin is warm and dry.  Psychiatric: She has a normal mood and affect.    ED Course  Procedures (including  critical care time)  Pt appears in new onset of a-fib, HR currently elevated 120s. Will give a small bolus, 500cc, will reassess. Pt also complaining of vertigo like symptoms, dizziness, nausea. Will reasess once hr improved. Labs pending.  Results for orders placed during the hospital encounter of 12/23/11  CBC      Component Value Range   WBC 5.8  4.0 - 10.5 (K/uL)   RBC 3.58 (*) 3.87 - 5.11 (MIL/uL)   Hemoglobin 10.9 (*) 12.0 - 15.0 (g/dL)   HCT 32.5 (*) 36.0 - 46.0 (%)   MCV 90.8  78.0 - 100.0 (fL)   MCH 30.4  26.0 - 34.0 (pg)   MCHC 33.5  30.0 - 36.0 (g/dL)   RDW 14.1  11.5 - 15.5 (%)   Platelets 177  150 - 400 (K/uL)  DIFFERENTIAL      Component Value Range   Neutrophils Relative 78 (*) 43 - 77 (%)   Neutro Abs 4.5  1.7 - 7.7 (K/uL)   Lymphocytes Relative 18  12 - 46 (%)   Lymphs Abs 1.1  0.7 - 4.0 (K/uL)   Monocytes Relative 3  3 - 12 (%)   Monocytes Absolute 0.2  0.1 - 1.0 (K/uL)   Eosinophils Relative 1  0 - 5 (%)   Eosinophils Absolute 0.0  0.0 - 0.7 (K/uL)   Basophils Relative 1  0 - 1 (%)   Basophils Absolute 0.0  0.0 - 0.1 (K/uL)  BASIC METABOLIC PANEL      Component Value Range   Sodium 134 (*) 135 - 145 (mEq/L)   Potassium 3.8  3.5 - 5.1 (mEq/L)   Chloride 101  96 - 112 (mEq/L)   CO2 18 (*) 19 - 32 (mEq/L)   Glucose, Bld 177 (*) 70 - 99 (mg/dL)   BUN 26 (*) 6 - 23 (mg/dL)   Creatinine, Ser 1.11 (*) 0.50 - 1.10 (mg/dL)   Calcium 9.4  8.4 - 10.5 (mg/dL)   GFR calc non Af Amer 47 (*) >90 (mL/min)   GFR calc Af Amer 55 (*) >90 (mL/min)  POCT I-STAT TROPONIN I      Component Value Range   Troponin i, poc 0.00  0.00 - 0.08 (ng/mL)   Comment 3            Dg Chest Portable 1 View  12/23/2011  *RADIOLOGY REPORT*  Clinical Data: Atrial fibrillation.  Dizziness. Nausea and vomiting.  Epistaxis.  PORTABLE CHEST - 1 VIEW  Comparison: 11/17/2008  Findings: Low lung volumes again demonstrated.  Both lungs remain clear.  Heart size is stable.  No evidence of pleural  effusion. Patient is partially rotated to the left.  IMPRESSION: Low lung volumes.  No acute findings.  Original Report Authenticated By: Marlaine Hind, M.D.    Pt's hr improved now under 100. Pt continues to report dizziness and nausea. Pt has normal neurological exam otherwise. Will order MRI to r/o stroke. Doubt a bleed, pt denies HA, normal neuro exam.    Date: 12/23/2011  Rate: 109   Rhythm: atrial  fibrillation  QRS Axis: normal  Intervals: normal  ST/T Wave abnormalities: normal  Conduction Disutrbances:none  Narrative Interpretation:   Old EKG Reviewed: changes noted new onset of afib   10:26 AM Spoke with [facility] cardiology. Will come see pt.  12:34 PM Pt seen by [facility]. Pt spontaneously converted back to NSR. Additional orders added by cardiology. Will wait for MRI to r/o stroke.   3:03 PM MRI negative for a stroke. Pt given valium for what sounds like a peripheral vertigo. Pt states she feels better. Will ambulate  Pt unable to ambulate. Admitted to outpatient clinics service for further management. [place], [place] 12/24/11 1558

## 2011-12-23 NOTE — ED Notes (Signed)
Attempted to ambulate patient in hallway. Patient got increasingly dizzy when standing at the bedside. Sat patient back down and notified Tatyana, [place].

## 2011-12-24 DIAGNOSIS — I059 Rheumatic mitral valve disease, unspecified: Secondary | ICD-10-CM

## 2011-12-24 LAB — COMPREHENSIVE METABOLIC PANEL
ALT: 8 U/L (ref 0–35)
Alkaline Phosphatase: 94 U/L (ref 39–117)
CO2: 23 mEq/L (ref 19–32)
Chloride: 104 mEq/L (ref 96–112)
GFR calc Af Amer: 49 mL/min — ABNORMAL LOW (ref >90)
GFR calc non Af Amer: 43 mL/min — ABNORMAL LOW (ref >90)
Glucose, Bld: 141 mg/dL — ABNORMAL HIGH (ref 70–99)
Potassium: 3.6 mEq/L (ref 3.5–5.1)
Sodium: 137 mEq/L (ref 135–145)
Total Bilirubin: 0.4 mg/dL (ref 0.3–1.2)
Total Protein: 6.2 g/dL (ref 6.0–8.3)

## 2011-12-24 LAB — CBC
HCT: 28.2 % — ABNORMAL LOW (ref 36.0–46.0)
MCHC: 33.3 g/dL (ref 30.0–36.0)
RDW: 14.5 % (ref 11.5–15.5)

## 2011-12-24 LAB — FERRITIN: Ferritin: 400 ng/mL — ABNORMAL HIGH (ref 10–291)

## 2011-12-24 LAB — FOLATE: Folate: 11.5 ng/mL

## 2011-12-24 LAB — IRON AND TIBC
Iron: 80 ug/dL (ref 42–135)
TIBC: 293 ug/dL (ref 250–470)
UIBC: 213 ug/dL (ref 125–400)

## 2011-12-24 LAB — CARDIAC PANEL(CRET KIN+CKTOT+MB+TROPI)
CK, MB: 2 ng/mL (ref 0.3–4.0)
CK, MB: 1.9 ng/mL (ref 0.3–4.0)

## 2011-12-24 LAB — OCCULT BLOOD X 1 CARD TO LAB, STOOL: Fecal Occult Bld: NEGATIVE

## 2011-12-24 MED ORDER — OFF THE BEAT BOOK
Freq: Once | Status: AC
  Administered 2011-12-24: 11:00:00
  Filled 2011-12-24: qty 1

## 2011-12-24 MED ORDER — LEVALBUTEROL HCL 0.63 MG/3ML IN NEBU
0.6300 mg | INHALATION_SOLUTION | Freq: Four times a day (QID) | RESPIRATORY_TRACT | Status: DC | PRN
  Filled 2011-12-24: qty 3

## 2011-12-24 NOTE — Progress Notes (Signed)
Vestibular Assessment  Subjectively pt describes her vertigo as a "spinning" "zooming sensation where the room moves around her.  She relates this happening with movement and generally involves an intensity that declines over seconds to minutes when she stops moving.  She reports no falls, no viral infections and no other incidents that she can contribute to these s/s.  Completed a brief oculomotor exam with no abnormal s/s (nystagmus) with saccades, pursuits, VOR or head thrust.  Mobility Assessment showed pt could move supine to sit with Supervision(S) with dizziness, could scoot to EOB with S but was guarded and needed min A sit to stand.  Pt experienced dysequilibrium standing EOB, grabbing for A to maintain balance with legs spread out to shoulder width.  Moved on the testing for BPPV based on subjective information.   Hallpike-Dix on R produced minute (hard to discern)  but constant ageotrophic torsional nystagmus that lasted for minutes along with the "zooming" sensation.  Hallpike-Dix to the L produced geotrophic torsional nystagmus that decreased in intensity after seconds.  Completed the Epply maneuver for potential L BPPV.  Will look into potential causes of the "ageotrophic" symptoms on the R.  Plan--Will return to reassess her vertigo 12/25/11.  Goals 1.  Reassess for continued BPPV on L and treat as necessarry.  2.reassess for R BPPV or horizontal circulation causes  3. Pt will be able to transition supine to sit independently with vertigo intensity no greater than 3/10.  4 pt will amb.>150' with least restrictive A device with >3/10 intensity of vertigo.  Min 3x/wk times 1 week;  Good rehabability.  12/24/2011  Donnella Sham, [place] [phone] (pager)

## 2011-12-24 NOTE — Progress Notes (Signed)
@   Subjective:  Denies CP or dyspnea; still with dizziness   Objective:  Filed Vitals:   12/23/11 1655 12/23/11 1715 12/23/11 1844 12/23/11 2100  BP: 144/64 132/63 131/73 138/72  Pulse: 70 70 69 72  Temp: 98 F (36.7 C)  97.9 F (36.6 C) 97.6 F (36.4 C)  TempSrc: Oral  Oral Oral  Resp: 23  18 20   Height:   5\' 4"  (1.626 m)   Weight:   179 lb (81.194 kg)   SpO2: 99% 99% 97% 97%    Intake/Output from previous day: No intake or output data in the 24 hours ending 12/24/11 0655  Physical Exam: Physical exam: Well-developed well-nourished in no acute distress.  Skin is warm and dry.  HEENT is normal.  Neck is supple. No thyromegaly.  Chest is clear to auscultation with normal expansion.  Cardiovascular exam is regular rate and rhythm.  Abdominal exam nontender or distended. No masses palpated. Extremities show no edema. neuro grossly intact    Lab Results: Basic Metabolic Panel:  Basename 12/23/11 2358 12/23/11 2007 12/23/11 0903  NA 137 -- 134*  K 3.6 -- 3.8  CL 104 -- 101  CO2 23 -- 18*  GLUCOSE 141* -- 177*  BUN 24* -- 26*  CREATININE 1.21* -- 1.11*  CALCIUM 8.8 -- 9.4  MG -- 1.7 --  PHOS -- -- --   CBC:  Basename 12/23/11 2358 12/23/11 0903  WBC 7.0 5.8  NEUTROABS -- 4.5  HGB 9.4* 10.9*  HCT 28.2* 32.5*  MCV 91.9 90.8  PLT 161 177   Cardiac Enzymes:  Basename 12/23/11 2358  CKTOTAL 80  CKMB 2.0  CKMBINDEX --  TROPONINI <0.30     Assessment/Plan:  1) atrial fibrillation - Patient remains in sinus rhythm; TSH normal; continue atenolol at present dose; echo to quantify LV function; multiple embolic risk factors; however, significant anemia and recent nosebleeds. Continue aspirin. Will need outpatient ENT eval and anemia WU. Will begin xeralto once above evaluation complete. Patient can be dced from cardiac standpoint and fu with me for atrial fibrillation in 2-4 weeks. Echo can be done either as inpatient or outpatient. 2)? Vertigo - MRI negative;  Rx per primary care 3) hypertension - BP controlled; continue present meds  Kirk Ruths 12/24/2011, 6:55 AM

## 2011-12-24 NOTE — ED Provider Notes (Signed)
Medical screening examination/treatment/procedure(s) were conducted as a shared visit with non-physician practitioner(s) and myself.  I personally evaluated the patient during the encounter   Kathalene Frames, MD 12/24/11 2001

## 2011-12-24 NOTE — Progress Notes (Signed)
  Echocardiogram 2D Echocardiogram has been performed.  Jasmine [name] Jasmine [name] 12/24/2011, 2:26 PM

## 2011-12-24 NOTE — Progress Notes (Signed)
PROGRESS NOTE  Jasmine [name] [id number] DOB: [birth year] DOA: 26-Dec-2011 PCP: Kevan Ny, MD, MD  Brief narrative: 78 year old woman presented with severe dizziness/vertigo, nausea, vomiting. She was found to have atrial fibrillation with rapid ventricular response. Cardiology was consulted in the emergency department. MRI of the brain was ordered concern for possible stroke. She was admitted for atrial fibrillation with rapid ventricular response and dizziness.  Past medical history: Hypertension, CHF NOS, hyperlipidemia  Consultants:  Cardiology  Procedures:  None  Antibiotics:  None  Interim History: Chart reviewed. Subjective: No cardiac complaints. Still feels dizzy.  Objective: Filed Vitals:   26-Dec-2011 2100 12/24/11 0500 12/24/11 0756 12/24/11 0803  BP: 138/72 116/53    Pulse: 72 54 58 58  Temp: 97.6 F (36.4 C) 97.5 F (36.4 C)    TempSrc: Oral Oral    Resp: 20 18 16 16   Height:      Weight:      SpO2: 97% 95% 99% 99%   No intake or output data in the 24 hours ending 12/24/11 0856  Exam:  General: Appears calm and comfortable. Cardiovascular: Regular rate and rhythm. No murmur, rub, gallop. No lower extremity edema. Telemetry: Sinus rhythm/sinus bradycardia. Respiratory: Clear to auscultation bilaterally. No wheezes, rales or rhonchi. Normal respiratory effort.  Data Reviewed: Basic Metabolic Panel:  Lab [id number] 2011-12-26 2007 2011-12-26 0903  NA 137 -- 134*  K 3.6 -- 3.8  CL 104 -- 101  CO2 23 -- 18*  GLUCOSE 141* -- 177*  BUN 24* -- 26*  CREATININE 1.21* -- 1.11*  CALCIUM 8.8 -- 9.4  MG -- 1.7 --  PHOS -- -- --   Liver Function Tests:  Lab 2011/12/26 2358 2011/12/26 2007  AST 16 15  ALT 8 9  ALKPHOS 94 106  BILITOT 0.4 0.6  PROT 6.2 6.9  ALBUMIN 2.8* 3.2*   CBC:  Lab 12/26/2011 2358 December 26, 2011 0903  WBC 7.0 5.8  NEUTROABS -- 4.5  HGB 9.4* 10.9*  HCT 28.2* 32.5*  MCV 91.9 90.8  PLT 161 177   Cardiac Enzymes:  Lab 2011-12-26  2358  CKTOTAL 80  CKMB 2.0  CKMBINDEX --  TROPONINI <0.30    Studies: Mr Jeri Cos [id number] Contrast  12-26-2011  *RADIOLOGY REPORT*  Clinical Data: Atrial fibrillation.  Acute vertigo with nausea and vomiting.  MRI HEAD WITHOUT AND WITH CONTRAST  Technique:  Multiplanar, multiecho pulse sequences of the brain and surrounding structures were obtained according to standard protocol without and with intravenous contrast  Contrast: 72mL MULTIHANCE GADOBENATE DIMEGLUMINE 529 MG/ML IV SOLN  Comparison: 06/20/2007 CT.  Findings: No acute infarct.  No intracranial hemorrhage.  Mild global atrophy without hydrocephalus.  Minimal white matter type changes.  Diminutive size right vertebral artery.  Small air-fluid level left sphenoid sinus.  Minimal ethmoid sinus air cell opacification.  No intracranial mass or abnormal enhancement.  IMPRESSION: No acute infarct.  Please see above.  Original Report Authenticated By: Doug Sou, M.D.   Dg Chest Portable 1 View  26-Dec-2011  *RADIOLOGY REPORT*  Clinical Data: Atrial fibrillation.  Dizziness. Nausea and vomiting.  Epistaxis.  PORTABLE CHEST - 1 VIEW  Comparison: 11/17/2008  Findings: Low lung volumes again demonstrated.  Both lungs remain clear.  Heart size is stable.  No evidence of pleural effusion. Patient is partially rotated to the left.  IMPRESSION: Low lung volumes.  No acute findings.  Original Report Authenticated By: Marlaine Hind, M.D.   Ct Maxillofacial Wo Cm  26-Dec-2011  *RADIOLOGY  REPORT*  Clinical Data: Nausea and dizziness.  Epistaxis.  CT MAXILLOFACIAL WITHOUT CONTRAST  Technique:  Multidetector CT imaging of the maxillofacial structures was performed. Multiplanar CT image reconstructions were also generated.  Comparison: MRI same day.  CT 03/26/2011.  Findings: Frontal sinuses are clear.  Ethmoid sinuses are clear. There is a very small amount of fluid layering dependent within the sphenoid sinus.  Maxillary sinuses are clear.  No fluid in the middle  ears or mastoids.  Nasal septum deviates mildly towards the right.  No focal osseous lesion.  No evidence of soft tissue mass. Ostiomeatal complexes are widely patent.  IMPRESSION: Normal except for a small amount of fluid layering dependently in the sphenoid sinus and mild nasal septal deviation towards the right.  Original Report Authenticated By: Jules Schick, M.D.    Scheduled Meds:   . aspirin  325 mg Oral Daily  . atenolol  100 mg Oral Daily  . tenormin  50 mg Oral QHS  . diazepam  2.5 mg Intravenous Once  . docusate sodium  100 mg Oral BID  . enoxaparin  40 mg Subcutaneous Q24H  . gadobenate dimeglumine  15 mL Intravenous Once  . loratadine  10 mg Oral Daily  . meclizine  50 mg Oral Once  . off the beat book   Does not apply Once  . olmesartan  10 mg Oral Daily  . DISCONTD: levalbuterol  0.63 mg Nebulization Q6H   Continuous Infusions:   . sodium chloride 75 mL/hr at 12/23/11 2103     Assessment/Plan: 1. Atrial fibrillation with rapid ventricular response, new onset: Currently in sinus rhythm. TSH within normal limits. Aspirin 81 mg per day. Check 2-D echocardiogram. Modified atenolol as per cardiology. Consider anticoagulation in the future; currently deferred secondary to recurrent epistaxis and anemia. Followup with cardiology in 2-4 weeks. 2. Dizziness: MRI unremarkable. Suspected BPPV. PT consult as vestibular origin is suspected. Meclizine. 3. Anemia: Followup as outpatient. 4. Recurrent epistaxis: Outpatient evaluation by ENT.  Case discussed with her primary care physician, Dr. Kevan Ny. He concurs with the current plan. If the patient is not discharged today he will assume her care January 16.  Code Status: Full code.  Disposition Plan: Pending physical therapy evaluation.   Murray Hodgkins, MD  [facility] Pager [phone] 12/24/2011, 8:56 AM    LOS: 1 day

## 2011-12-25 MED ORDER — ATENOLOL 100 MG PO TABS
100.0000 mg | ORAL_TABLET | Freq: Every day | ORAL | Status: DC
  Administered 2011-12-26: 100 mg via ORAL
  Filled 2011-12-25: qty 1

## 2011-12-25 MED ORDER — MECLIZINE HCL 12.5 MG PO TABS
12.5000 mg | ORAL_TABLET | Freq: Three times a day (TID) | ORAL | Status: DC
  Administered 2011-12-25 – 2011-12-26 (×3): 12.5 mg via ORAL
  Filled 2011-12-25 (×6): qty 1

## 2011-12-25 MED ORDER — ATENOLOL 50 MG PO TABS
50.0000 mg | ORAL_TABLET | Freq: Every day | ORAL | Status: DC
  Administered 2011-12-25: 50 mg via ORAL
  Filled 2011-12-25 (×2): qty 1

## 2011-12-25 NOTE — Progress Notes (Signed)
Subjective:  History reviewed and as noted. Patient is feeling better today. She denies chest pains shortness breath or palpitations. Telemetry has not demonstrated any further episodes of atrial fibrillation. Patient does feel unsteady on her feet associated with vertigo symptoms. She does describe a sensation of room spinning and feeling nauseated as well. She notably had extrinsic severe episode of this prior to coming to the emergency room. It is unclear for nausea and vomiting exacerbated or falls atrial fibrillation. Workup for the cause of her atrial fibrillation and thus far been unrevealing. History significant for patient's identical twin sister also having problems with vertigo. Patient relates that she occasionally has a humming sensation in her ears. She does have more vertigo she looks to her right versus left. Patient was seen by physical therapy today which did not improve her symptoms.   Allergies  Allergen Reactions  . Codeine   . Sulfa Antibiotics    Current Facility-Administered Medications  Medication Dose Route Frequency Provider Last Rate Last Dose  . acetaminophen (TYLENOL) tablet 650 mg  650 mg Oral Q6H PRN Reyne Dumas, MD       Or  . acetaminophen (TYLENOL) suppository 650 mg  650 mg Rectal Q6H PRN Reyne Dumas, MD      . aspirin tablet 325 mg  325 mg Oral Daily Reyne Dumas, MD   325 mg at 12/25/11 1031  . atenolol (TENORMIN) tablet 100 mg  100 mg Oral Daily Kevan Ny, MD      . atenolol (TENORMIN) tablet 50 mg  50 mg Oral QHS Kevan Ny, MD   50 mg at 12/25/11 2132  . docusate sodium (COLACE) capsule 100 mg  100 mg Oral BID Reyne Dumas, MD   100 mg at 12/25/11 2130  . enoxaparin (LOVENOX) injection 40 mg  40 mg Subcutaneous Q24H Reyne Dumas, MD   40 mg at 12/25/11 2131  . levalbuterol (XOPENEX) nebulizer solution 0.63 mg  0.63 mg Nebulization Q6H PRN [facility], RRT      . loratadine (CLARITIN) tablet 10 mg  10 mg Oral Daily Reyne Dumas, MD   10 mg at  12/25/11 1031  . meclizine (ANTIVERT) tablet 12.5 mg  12.5 mg Oral TID Kevan Ny, MD   12.5 mg at 12/25/11 2131  . olmesartan (BENICAR) tablet 10 mg  10 mg Oral Daily Reyne Dumas, MD   10 mg at 12/24/11 1035  . ondansetron (ZOFRAN) tablet 4 mg  4 mg Oral Q6H PRN Reyne Dumas, MD       Or  . ondansetron (ZOFRAN) injection 4 mg  4 mg Intravenous Q6H PRN Reyne Dumas, MD      . oxyCODONE (Oxy IR/ROXICODONE) immediate release tablet 5 mg  5 mg Oral Q6H PRN Reyne Dumas, MD      . sodium chloride (OCEAN) 0.65 % nasal spray 1 spray  1 spray Each Nare Daily PRN Reyne Dumas, MD      . DISCONTD: atenolol (TENORMIN) tablet 100 mg  100 mg Oral Daily Reyne Dumas, MD   100 mg at 12/24/11 1035  . DISCONTD: atenolol (TENORMIN) tablet 50 mg  50 mg Oral QHS Reyne Dumas, MD   50 mg at 12/24/11 2211  . DISCONTD: meclizine (ANTIVERT) tablet 12.5 mg  12.5 mg Oral TID PRN Reyne Dumas, MD        Objective: Blood pressure 125/68, pulse 63, temperature 97.3 F (36.3 C), temperature source Oral, resp. rate 18, height 5\' 4"  (1.626 m), weight 179 lb (81.194 kg),  SpO2 97.00%.  Well-developed well-nourished black female in no acute distress. HEENT: No sinus tenderness. Mild vertigo left lateral gaze. TMs notably are clear bilaterally. NECK: No posterior cervical nodes. LUNGS: Clear to auscultation. No vocal fremitus. CV: Normal S1, S2 occasional ectopic beat. No S3 or S4. ABD: Soft nontender. MSK: Trace edema negative Homans. NEURO: Alert oriented x3. Negative drift. Strength is intact.  Lab results: Results for orders placed during the hospital encounter of 12/23/11 (from the past 48 hour(s))  CARDIAC PANEL(CRET KIN+CKTOT+MB+TROPI)     Status: Normal   Collection Time   12/23/11 11:58 PM      Component Value Range Comment   Total CK 80  7 - 177 (U/L)    CK, MB 2.0  0.3 - 4.0 (ng/mL)    Troponin I <0.30  <0.30 (ng/mL)    Relative Index RELATIVE INDEX IS INVALID  0.0 - 2.5    COMPREHENSIVE METABOLIC PANEL      Status: Abnormal   Collection Time   12/23/11 11:58 PM      Component Value Range Comment   Sodium 137  135 - 145 (mEq/L)    Potassium 3.6  3.5 - 5.1 (mEq/L)    Chloride 104  96 - 112 (mEq/L)    CO2 23  19 - 32 (mEq/L)    Glucose, Bld 141 (*) 70 - 99 (mg/dL)    BUN 24 (*) 6 - 23 (mg/dL)    Creatinine, Ser 1.21 (*) 0.50 - 1.10 (mg/dL)    Calcium 8.8  8.4 - 10.5 (mg/dL)    Total Protein 6.2  6.0 - 8.3 (g/dL)    Albumin 2.8 (*) 3.5 - 5.2 (g/dL)    AST 16  0 - 37 (U/L)    ALT 8  0 - 35 (U/L)    Alkaline Phosphatase 94  39 - 117 (U/L)    Total Bilirubin 0.4  0.3 - 1.2 (mg/dL)    GFR calc non Af Amer 43 (*) >90 (mL/min)    GFR calc Af Amer 49 (*) >90 (mL/min)   CBC     Status: Abnormal   Collection Time   12/23/11 11:58 PM      Component Value Range Comment   WBC 7.0  4.0 - 10.5 (K/uL)    RBC 3.07 (*) 3.87 - 5.11 (MIL/uL)    Hemoglobin 9.4 (*) 12.0 - 15.0 (g/dL)    HCT 28.2 (*) 36.0 - 46.0 (%)    MCV 91.9  78.0 - 100.0 (fL)    MCH 30.6  26.0 - 34.0 (pg)    MCHC 33.3  30.0 - 36.0 (g/dL)    RDW 14.5  11.5 - 15.5 (%)    Platelets 161  150 - 400 (K/uL)   CARDIAC PANEL(CRET KIN+CKTOT+MB+TROPI)     Status: Normal   Collection Time   12/24/11  9:15 AM      Component Value Range Comment   Total CK 96  7 - 177 (U/L)    CK, MB 2.0  0.3 - 4.0 (ng/mL)    Troponin I <0.30  <0.30 (ng/mL)    Relative Index RELATIVE INDEX IS INVALID  0.0 - 2.5    CARDIAC PANEL(CRET KIN+CKTOT+MB+TROPI)     Status: Normal   Collection Time   12/24/11  4:13 PM      Component Value Range Comment   Total CK 95  7 - 177 (U/L)    CK, MB 1.9  0.3 - 4.0 (ng/mL)    Troponin I <0.30  <  0.30 (ng/mL)    Relative Index RELATIVE INDEX IS INVALID  0.0 - 2.5    OCCULT BLOOD X 1 CARD TO LAB, STOOL     Status: Normal   Collection Time   12/24/11  5:27 PM      Component Value Range Comment   Fecal Occult Bld NEGATIVE       Studies/Results: No results found.  Patient Active Problem List  Diagnoses  . Atrial  fibrillation    Impression: New onset atrial fibrillation question etiology. Presently resolved. New onset vertigo. Rule out secondary to vestibular neuronitis versus benign positional vertigo versus other. No evidence for occult infection. Notably patient is a consistent as similar symptoms to a less degree. Hypertension history. History of insulin resistance. Anemia question etiology. Rule out component of chronic disease. Chronic kidney disease 1. Mild increase in creatinine. We'll followup.   Plan: CBC, CMET, iron TIBC studies in a.m. Meclizine 12.5 mg 3 times a day not when necessary. Anticipate discharge home with outpatient followup and evaluation.   Marlou Sa, Sani Loiseau 12/25/2011 10:25 PM

## 2011-12-25 NOTE — Progress Notes (Signed)
@   Subjective:  Denies CP or dyspnea; still with dizziness   Objective:  Filed Vitals:   12/24/11 1400 12/24/11 2100 12/24/11 2211 12/25/11 0500  BP: 124/60 123/76 128/67 154/66  Pulse: 63 62 66 61  Temp: 97.5 F (36.4 C) 97.2 F (36.2 C)  97.4 F (36.3 C)  TempSrc: Oral     Resp: 18 20  18   Height:      Weight:      SpO2: 99% 97%  98%    Intake/Output from previous day:  Intake/Output Summary (Last 24 hours) at 12/25/11 0723 Last data filed at 12/25/11 0600  Gross per 24 hour  Intake   2095 ml  Output   1900 ml  Net    195 ml    Physical Exam: Physical exam: Well-developed well-nourished in no acute distress.  Skin is warm and dry.  HEENT is normal.  Neck is supple. No thyromegaly.  Chest is clear to auscultation with normal expansion.  Cardiovascular exam is regular rate and rhythm.  Abdominal exam nontender or distended. No masses palpated. Extremities show no edema. neuro grossly intact    Lab Results: Basic Metabolic Panel:  Basename 12/23/11 2358 12/23/11 2007 12/23/11 0903  NA 137 -- 134*  K 3.6 -- 3.8  CL 104 -- 101  CO2 23 -- 18*  GLUCOSE 141* -- 177*  BUN 24* -- 26*  CREATININE 1.21* -- 1.11*  CALCIUM 8.8 -- 9.4  MG -- 1.7 --  PHOS -- -- --   CBC:  Basename 12/23/11 2358 12/23/11 0903  WBC 7.0 5.8  NEUTROABS -- 4.5  HGB 9.4* 10.9*  HCT 28.2* 32.5*  MCV 91.9 90.8  PLT 161 177   Cardiac Enzymes:  Basename 12/24/11 1613 12/24/11 0915 12/23/11 2358  CKTOTAL 95 96 80  CKMB 1.9 2.0 2.0  CKMBINDEX -- -- --  TROPONINI <0.30 <0.30 <0.30     Assessment/Plan:  1) atrial fibrillation - Patient remains in sinus rhythm; TSH normal; LV function normal; continue atenolol at present dose; multiple embolic risk factors; however, significant anemia and recent nosebleeds. Continue aspirin. Will need outpatient ENT eval and anemia WU. Will begin xeralto once above evaluation complete. Patient can be dced from cardiac standpoint and fu with me for  atrial fibrillation in 2-4 weeks.  2)? Vertigo - MRI negative; Rx per primary care 3) hypertension - BP controlled; continue present meds  Kirk Ruths 12/25/2011, 7:23 AM

## 2011-12-26 MED ORDER — MECLIZINE HCL 12.5 MG PO TABS: 12.5000 mg | ORAL_TABLET | Freq: Three times a day (TID) | ORAL | Status: AC

## 2011-12-26 MED ORDER — ASPIRIN 325 MG PO TABS: 325.0000 mg | ORAL_TABLET | Freq: Every day | ORAL | Status: DC

## 2011-12-26 MED ORDER — ATENOLOL 50 MG PO TABS: 50.0000 mg | ORAL_TABLET | Freq: Every day | ORAL | Status: DC

## 2011-12-26 MED ORDER — TRIAMTERENE-HCTZ 75-50 MG PO TABS: 0.5000 | ORAL_TABLET | Freq: Every day | ORAL | Status: DC

## 2011-12-26 NOTE — Progress Notes (Signed)
Reviewed discharge instructions with patient and her sister, they stated their understanding.  Vertigo information sheet given to patient.  Patient also has off the beat booklet and handouts for xeralto/pradaxa.  IV discontinued with cath intact.  Patient discharged via wheelchair by volunteers.  Sanda Linger

## 2011-12-28 NOTE — Discharge Summary (Signed)
Physician Discharge Summary  Patient ID: TARRI KOENIGSBERG MRN: [id number] DOB/AGE: 1934-06-12 78 y.o.  Admit date: 12/23/2011 Discharge date: 12/26/2011   Discharge Diagnoses:   New onset atrial fibrillation resolved Acute vertigo rule out benign positional. Presently improved. Hypertension stable Insulin resistance. Chronic kidney disease 1 mild increase in creatinine. Mild anemia of chronic disease. Rule out other as an outpatient. Brief epistaxis, spontaneously resolved.  Discharged Condition: Improved.  Operations/Procedues: None.   Hospital Course: See admission history and physical for details. Patient was admitted to telemetry to the hospitalist service after presenting to the emergency room with acute onset of dizziness. In the emergency room she is found to be in atrial fibrillation which was new. Her only previous tracing on record from 2008 showed a normal sinus rhythm. Patient notably spontaneously converted back to sinus rhythm. Etiology of her dizziness was pursued as well. Patient was subsequently admitted to the hospitalist service for further evaluation. An MRI scan of the head did not demonstrate an acute infarct. She was noted to have mild sinus disease. Patient was placed on telemetry. Her cardiac enzymes were negative. Serial observation did not demonstrate any further recurrence of her atrial fibrillation. Her atenolol medication was adjusted upward which she tolerated well. Patient was seen by physical therapy who performed the Epply maneuver on patient. Her vertigo symptoms did decrease but not completely resolved. She was placed on low-dose meclizine 12.5 mg 3 times a day which she tolerated and helped greatly.  Patient was also noted on admission to be mildly anemic with hemoglobin of 10.9. Her Hemoccults were negative for blood. She was noted to have a mild increase in creatinine suggestive of anemia of chronic disease. This will be followed up on as an outpatient as  well.  By 12/26/2011 she was feeling significantly better. She had no further arrhythmias. No evidence for orthostasis. Patient was ultimately discharged home improved with further outpatient followup.   Significant Diagnostic Studies: MRI scan of the head. CT scan of the sinuses. 2-D echocardiogram.  Disposition: Home or Self Care  Discharge Orders    Future Appointments: Provider: Department: Dept Phone: Center:   01/07/2012 8:30 AM Lelon Perla, MD [place] [phone] [id number]     Future Orders Please Complete By Expires   Diet - low sodium heart healthy      Increase activity slowly      No wound care      Call MD for:  severe uncontrolled pain      Discharge instructions      Comments:   Follow up with primary care physician in one week.     Discharge Medication List as of 12/26/2011  9:01 AM    START taking these medications   Details  aspirin 325 MG tablet Take 1 tablet (325 mg total) by mouth daily., Starting 12/26/2011, Until Fri 12/25/12, Print    !! atenolol (TENORMIN) 50 MG tablet Take 1 tablet (50 mg total) by mouth at bedtime., Starting 12/26/2011, Until Fri 12/25/12, Print    meclizine (ANTIVERT) 12.5 MG tablet Take 1 tablet (12.5 mg total) by mouth 3 (three) times daily., Starting 12/26/2011, Until Sun 01/05/12, Print     !! - Potential duplicate medications found. Please discuss with provider.    CONTINUE these medications which have CHANGED   Details  triamterene-hydrochlorothiazide (MAXZIDE) 75-50 MG per tablet Take 0.5 tablets by mouth daily., Starting 12/26/2011, Until Discontinued, Normal      CONTINUE these medications which have NOT CHANGED  Details  !! atenolol (TENORMIN) 100 MG tablet Take 100 mg by mouth daily.  , Until Discontinued, Historical Med    fexofenadine (ALLEGRA) 180 MG tablet Take 180 mg by mouth daily as needed. For allergies , Until Discontinued, Historical Med    irbesartan (AVAPRO) 300 MG tablet Take 300 mg by mouth  daily.  , Until Discontinued, Historical Med    sodium chloride (OCEAN) 0.65 % nasal spray Place 1 spray into both nostrils daily as needed. For allergies, Until Discontinued, Historical Med     !! - Potential duplicate medications found. Please discuss with provider.     Follow-up Information    Follow up with Kirk Ruths, MD. (January 29th at 8:30am)    Contact information:   [address], [place]          Signed: Marlou Sa, Mancil Pfenning 12/28/2011, 4:52 PM

## 2012-01-06 ENCOUNTER — Encounter: Payer: Self-pay | Admitting: *Deleted

## 2012-01-07 ENCOUNTER — Ambulatory Visit (INDEPENDENT_AMBULATORY_CARE_PROVIDER_SITE_OTHER): Payer: Medicare Other | Admitting: Cardiology

## 2012-01-07 ENCOUNTER — Encounter: Payer: Self-pay | Admitting: Cardiology

## 2012-01-07 DIAGNOSIS — I4891 Unspecified atrial fibrillation: Secondary | ICD-10-CM

## 2012-01-07 DIAGNOSIS — I10 Essential (primary) hypertension: Secondary | ICD-10-CM | POA: Insufficient documentation

## 2012-01-07 NOTE — Assessment & Plan Note (Signed)
Blood pressure controlled. Continue present medications. 

## 2012-01-07 NOTE — Progress Notes (Signed)
   HPI: 78 yo female for fu of atrial fibrillation. Recently presented to ER with probable vertigo. Noted to have atrial fibrillation in the ER and converted to sinus spontaneously. Echo in Jan 2013 revealed normal LV function, moderate LAE and mild RAE; mild MR and TR. TSH normal. Patient not placed on coumadin because of nosebleeds and anemia (ENT and anemia eval felt needed prior to starting coumadin). Since she was discharged, there is no dyspnea on exertion, orthopnea, PND, palpitations, syncope, chest pain or bleeding.  Current Outpatient Prescriptions  Medication Sig Dispense Refill  . aspirin 81 MG tablet Take 81 mg by mouth daily.      Marland Kitchen atenolol (TENORMIN) 100 MG tablet Take 100 mg by mouth daily.        Marland Kitchen atenolol (TENORMIN) 50 MG tablet Take 1 tablet (50 mg total) by mouth at bedtime.  30 tablet  1  . fexofenadine (ALLEGRA) 180 MG tablet Take 180 mg by mouth daily as needed. For allergies       . irbesartan (AVAPRO) 300 MG tablet Take 300 mg by mouth daily.        . meclizine (ANTIVERT) 12.5 MG tablet Take 12.5 mg by mouth 3 (three) times daily as needed.       . RABEprazole (ACIPHEX) 20 MG tablet Take 20 mg by mouth daily.      . sodium chloride (OCEAN) 0.65 % nasal spray Place 1 spray into both nostrils daily as needed. For allergies         Past Medical History  Diagnosis Date  . Allergic rhinitis due to pollen   . Essential hypertension, malignant   . Pure hypercholesterolemia   . Heart murmur   . GERD (gastroesophageal reflux disease)   . Congestive heart failure, unspecified     patient states she is not aware of this  . Vertigo   . Atrial fibrillation with rapid ventricular response   . Dizziness   . Insulin resistance     History of insulin resistance.    Past Surgical History  Procedure Date  . Cholecystectomy   . Partial hysterectomy   . Appendectomy   . Breast biopsy     History   Social History  . Marital Status: Single    Spouse Name: N/A   Number of Children: N/A  . Years of Education: N/A   Occupational History  . Not on file.   Social History Main Topics  . Smoking status: Never Smoker   . Smokeless tobacco: Never Used  . Alcohol Use: No  . Drug Use: No  . Sexually Active: Not Currently    Birth Control/ Protection: Post-menopausal   Other Topics Concern  . Not on file   Social History Narrative  . No narrative on file    ROS: no fevers or chills, productive cough, hemoptysis, dysphasia, odynophagia, melena, hematochezia, dysuria, hematuria, rash, seizure activity, orthopnea, PND, pedal edema, claudication. Remaining systems are negative.  Physical Exam: Well-developed well-nourished in no acute distress.  Skin is warm and dry.  HEENT is normal.  Neck is supple. No thyromegaly.  Chest is clear to auscultation with normal expansion.  Cardiovascular exam is regular rate and rhythm.  Abdominal exam nontender or distended. No masses palpated. Extremities show no edema. neuro grossly intact

## 2012-01-07 NOTE — Patient Instructions (Signed)
Your physician wants you to follow-up in: 6 MONTHS You will receive a reminder letter in the mail two months in advance. If you don't receive a letter, please call our office to schedule the follow-up appointment. 

## 2012-01-07 NOTE — Assessment & Plan Note (Addendum)
Patient remains in sinus rhythm on examination. Continue atenolol for rate control if her atrial fibrillation recurs. She has multiple embolic risk factors including age 78, female sex and hypertension. I recommended Coumadin today but she declined. She would prefer to discuss this with Dr. Marlou Sa. She understands a higher risk of CVA with aspirin. She will contact does if she is agreeable to Coumadin in the future. Continue aspirin for now. Anemia evaluation ongoing by Dr. Marlou Sa.

## 2012-06-02 ENCOUNTER — Ambulatory Visit (HOSPITAL_COMMUNITY)
Admission: RE | Admit: 2012-06-02 | Discharge: 2012-06-02 | Disposition: A | Discharge status: Home / Self Care | Payer: Medicare Other | Source: Ambulatory Visit | Attending: Internal Medicine | Admitting: Internal Medicine

## 2012-06-02 ENCOUNTER — Encounter (HOSPITAL_COMMUNITY): Payer: Self-pay | Admitting: *Deleted

## 2012-06-02 ENCOUNTER — Other Ambulatory Visit: Payer: Self-pay

## 2012-06-02 ENCOUNTER — Inpatient Hospital Stay (HOSPITAL_COMMUNITY)
Admission: AD | Admit: 2012-06-02 | Discharge: 2012-06-11 | DRG: 299 | Disposition: A | Discharge status: Home / Self Care | Payer: Medicare Other | Source: Ambulatory Visit | Attending: Internal Medicine | Admitting: Internal Medicine

## 2012-06-02 DIAGNOSIS — I82409 Acute embolism and thrombosis of unspecified deep veins of unspecified lower extremity: Secondary | ICD-10-CM

## 2012-06-02 DIAGNOSIS — M79609 Pain in unspecified limb: Secondary | ICD-10-CM | POA: Insufficient documentation

## 2012-06-02 DIAGNOSIS — I5033 Acute on chronic diastolic (congestive) heart failure: Secondary | ICD-10-CM | POA: Diagnosis present

## 2012-06-02 DIAGNOSIS — I509 Heart failure, unspecified: Secondary | ICD-10-CM | POA: Diagnosis present

## 2012-06-02 DIAGNOSIS — Z6833 Body mass index (BMI) 33.0-33.9, adult: Secondary | ICD-10-CM

## 2012-06-02 DIAGNOSIS — N289 Disorder of kidney and ureter, unspecified: Secondary | ICD-10-CM | POA: Diagnosis not present

## 2012-06-02 DIAGNOSIS — R42 Dizziness and giddiness: Secondary | ICD-10-CM | POA: Diagnosis present

## 2012-06-02 DIAGNOSIS — D649 Anemia, unspecified: Secondary | ICD-10-CM | POA: Diagnosis present

## 2012-06-02 DIAGNOSIS — Z79899 Other long term (current) drug therapy: Secondary | ICD-10-CM

## 2012-06-02 DIAGNOSIS — T45515A Adverse effect of anticoagulants, initial encounter: Secondary | ICD-10-CM | POA: Diagnosis not present

## 2012-06-02 DIAGNOSIS — E039 Hypothyroidism, unspecified: Secondary | ICD-10-CM | POA: Diagnosis present

## 2012-06-02 DIAGNOSIS — I498 Other specified cardiac arrhythmias: Secondary | ICD-10-CM | POA: Diagnosis present

## 2012-06-02 DIAGNOSIS — M7981 Nontraumatic hematoma of soft tissue: Secondary | ICD-10-CM | POA: Diagnosis not present

## 2012-06-02 DIAGNOSIS — E8881 Metabolic syndrome: Secondary | ICD-10-CM | POA: Diagnosis present

## 2012-06-02 DIAGNOSIS — M7989 Other specified soft tissue disorders: Secondary | ICD-10-CM | POA: Insufficient documentation

## 2012-06-02 DIAGNOSIS — I10 Essential (primary) hypertension: Secondary | ICD-10-CM | POA: Diagnosis present

## 2012-06-02 DIAGNOSIS — I4891 Unspecified atrial fibrillation: Secondary | ICD-10-CM | POA: Diagnosis present

## 2012-06-02 DIAGNOSIS — R609 Edema, unspecified: Secondary | ICD-10-CM

## 2012-06-02 DIAGNOSIS — I472 Ventricular tachycardia, unspecified: Secondary | ICD-10-CM | POA: Diagnosis not present

## 2012-06-02 DIAGNOSIS — E7800 Pure hypercholesterolemia, unspecified: Secondary | ICD-10-CM | POA: Diagnosis present

## 2012-06-02 DIAGNOSIS — R52 Pain, unspecified: Secondary | ICD-10-CM

## 2012-06-02 DIAGNOSIS — I4729 Other ventricular tachycardia: Secondary | ICD-10-CM | POA: Diagnosis not present

## 2012-06-02 DIAGNOSIS — K219 Gastro-esophageal reflux disease without esophagitis: Secondary | ICD-10-CM | POA: Diagnosis present

## 2012-06-02 HISTORY — DX: Acute embolism and thrombosis of unspecified deep veins of unspecified lower extremity: I82.409

## 2012-06-02 LAB — DIFFERENTIAL
Lymphocytes Relative: 30 % (ref 12–46)
Lymphs Abs: 1.5 10*3/uL (ref 0.7–4.0)
Monocytes Absolute: 0.5 10*3/uL (ref 0.1–1.0)
Monocytes Relative: 9 % (ref 3–12)
Neutro Abs: 2.9 10*3/uL (ref 1.7–7.7)
Neutrophils Relative %: 57 % (ref 43–77)

## 2012-06-02 LAB — COMPREHENSIVE METABOLIC PANEL
ALT: 82 U/L — ABNORMAL HIGH (ref 0–35)
Albumin: 3.6 g/dL (ref 3.5–5.2)
Alkaline Phosphatase: 149 U/L — ABNORMAL HIGH (ref 39–117)
BUN: 30 mg/dL — ABNORMAL HIGH (ref 6–23)
Calcium: 9.5 mg/dL (ref 8.4–10.5)
GFR calc Af Amer: 50 mL/min — ABNORMAL LOW (ref >90)
Glucose, Bld: 87 mg/dL (ref 70–99)
Potassium: 4.9 mEq/L (ref 3.5–5.1)
Sodium: 134 mEq/L — ABNORMAL LOW (ref 135–145)
Total Protein: 7 g/dL (ref 6.0–8.3)

## 2012-06-02 LAB — PROTIME-INR: INR: 1.31 (ref 0.00–1.49)

## 2012-06-02 LAB — CBC
HCT: 32.5 % — ABNORMAL LOW (ref 36.0–46.0)
Hemoglobin: 10.6 g/dL — ABNORMAL LOW (ref 12.0–15.0)
RBC: 3.59 MIL/uL — ABNORMAL LOW (ref 3.87–5.11)

## 2012-06-02 LAB — MAGNESIUM: Magnesium: 2.1 mg/dL (ref 1.5–2.5)

## 2012-06-02 MED ORDER — ATENOLOL 50 MG PO TABS
50.0000 mg | ORAL_TABLET | Freq: Every day | ORAL | Status: DC
  Administered 2012-06-02: 50 mg via ORAL
  Filled 2012-06-02 (×2): qty 1

## 2012-06-02 MED ORDER — ATENOLOL 100 MG PO TABS
100.0000 mg | ORAL_TABLET | Freq: Every day | ORAL | Status: DC
  Administered 2012-06-03 – 2012-06-05 (×3): 100 mg via ORAL
  Filled 2012-06-02 (×3): qty 1

## 2012-06-02 MED ORDER — SALINE SPRAY 0.65 % NA SOLN
1.0000 | NASAL | Status: DC | PRN
  Filled 2012-06-02: qty 44

## 2012-06-02 MED ORDER — WARFARIN - PHARMACIST DOSING INPATIENT: Freq: Every day | Status: DC

## 2012-06-02 MED ORDER — WARFARIN VIDEO
Freq: Once | Status: AC
  Administered 2012-06-03: 12:00:00

## 2012-06-02 MED ORDER — IRBESARTAN 300 MG PO TABS
300.0000 mg | ORAL_TABLET | Freq: Every day | ORAL | Status: DC
  Administered 2012-06-03 – 2012-06-11 (×9): 300 mg via ORAL
  Filled 2012-06-02 (×9): qty 1

## 2012-06-02 MED ORDER — ENOXAPARIN SODIUM 100 MG/ML ~~LOC~~ SOLN
1.0000 mg/kg | Freq: Two times a day (BID) | SUBCUTANEOUS | Status: DC
  Administered 2012-06-02 – 2012-06-06 (×8): 85 mg via SUBCUTANEOUS
  Filled 2012-06-02 (×10): qty 1

## 2012-06-02 MED ORDER — TRIAMTERENE-HCTZ 37.5-25 MG PO TABS: 1.0000 | ORAL_TABLET | Freq: Every day | ORAL | Status: DC

## 2012-06-02 MED ORDER — SODIUM CHLORIDE 0.9 % IV SOLN: 250.0000 mL | INTRAVENOUS | Status: DC | PRN

## 2012-06-02 MED ORDER — ONDANSETRON HCL 4 MG/2ML IJ SOLN: 4.0000 mg | Freq: Four times a day (QID) | INTRAMUSCULAR | Status: DC | PRN

## 2012-06-02 MED ORDER — WARFARIN SODIUM 7.5 MG PO TABS
7.5000 mg | ORAL_TABLET | Freq: Once | ORAL | Status: AC
  Administered 2012-06-02: 7.5 mg via ORAL
  Filled 2012-06-02: qty 1

## 2012-06-02 MED ORDER — SODIUM CHLORIDE 0.9 % IJ SOLN: 3.0000 mL | INTRAMUSCULAR | Status: DC | PRN

## 2012-06-02 MED ORDER — LORATADINE 10 MG PO TABS
10.0000 mg | ORAL_TABLET | Freq: Every day | ORAL | Status: DC
  Administered 2012-06-02 – 2012-06-11 (×10): 10 mg via ORAL
  Filled 2012-06-02 (×10): qty 1

## 2012-06-02 MED ORDER — SODIUM CHLORIDE 0.9 % IJ SOLN
3.0000 mL | Freq: Two times a day (BID) | INTRAMUSCULAR | Status: DC
  Administered 2012-06-02 – 2012-06-10 (×17): 3 mL via INTRAVENOUS

## 2012-06-02 MED ORDER — DOCUSATE SODIUM 100 MG PO CAPS
100.0000 mg | ORAL_CAPSULE | Freq: Two times a day (BID) | ORAL | Status: DC
  Administered 2012-06-02 – 2012-06-11 (×18): 100 mg via ORAL
  Filled 2012-06-02 (×22): qty 1

## 2012-06-02 MED ORDER — PANTOPRAZOLE SODIUM 40 MG PO TBEC
40.0000 mg | DELAYED_RELEASE_TABLET | Freq: Every day | ORAL | Status: DC
  Administered 2012-06-02 – 2012-06-10 (×9): 40 mg via ORAL
  Filled 2012-06-02 (×10): qty 1

## 2012-06-02 MED ORDER — ONDANSETRON HCL 4 MG PO TABS: 4.0000 mg | ORAL_TABLET | Freq: Four times a day (QID) | ORAL | Status: DC | PRN

## 2012-06-02 MED ORDER — ASPIRIN EC 81 MG PO TBEC
81.0000 mg | DELAYED_RELEASE_TABLET | Freq: Every day | ORAL | Status: DC
  Administered 2012-06-02 – 2012-06-11 (×10): 81 mg via ORAL
  Filled 2012-06-02 (×10): qty 1

## 2012-06-02 MED ORDER — MECLIZINE HCL 12.5 MG PO TABS
12.5000 mg | ORAL_TABLET | Freq: Three times a day (TID) | ORAL | Status: DC | PRN
  Filled 2012-06-02: qty 1

## 2012-06-02 MED ORDER — COUMADIN BOOK
Freq: Once | Status: AC
  Administered 2012-06-03: 10:00:00
  Filled 2012-06-02: qty 1

## 2012-06-02 MED ORDER — FUROSEMIDE 20 MG PO TABS
20.0000 mg | ORAL_TABLET | Freq: Every day | ORAL | Status: DC
  Administered 2012-06-02 – 2012-06-04 (×3): 20 mg via ORAL
  Filled 2012-06-02 (×3): qty 1

## 2012-06-02 NOTE — H&P (Signed)
Jasmine [name] is an 78 y.o. female.   Chief Complaint: increasing leg pain and swelling. HPI: patient is a 78 year old single black female with history of hypertension, atrial fibrillation, and insulin resistance who came to the office complaining of increasing left leg pain. She  Had been noting increasing problems with exertional dyspnea over the past month. She began noticing problems with swelling more in the left leg versus the right as well. Patient was taking her diuretic on a regular basis. Despite this the left leg with notably more swollen than the right. Patient was seen in office today was noted to have a positive Homans on the left side versus right. Venous Doppler was done today which confirmed a deep venous thrombosis. She is admitted for further evaluation. She had otherwise been feeling well. She denies any recent substernal pressure pain. She has had an increase exertional dyspnea. Patient was previously evaluated by Dr. Stanford Breed in January this year for atrial fibrillation. Discussion did pursue regarding anticoagulation which patient was hesitant to pursue this.  Past Medical History  Diagnosis Date  . Allergic rhinitis due to pollen   . Essential hypertension, malignant   . Pure hypercholesterolemia   . Heart murmur   . GERD (gastroesophageal reflux disease)   . Congestive heart failure, unspecified     patient states she is not aware of this  . Vertigo   . Atrial fibrillation with rapid ventricular response   . Dizziness   . Insulin resistance     History of insulin resistance.    Past Surgical History  Procedure Date  . Cholecystectomy   . Partial hysterectomy   . Appendectomy   . Breast biopsy     History reviewed. No pertinent family history. Social History:  reports that she has never smoked. She has never used smokeless tobacco. She reports that she does not drink alcohol or use illicit drugs.  Allergies:  Allergies  Allergen Reactions  . Codeine   .  Sulfa Antibiotics     Medications Prior to Admission  Medication Sig Dispense Refill  . aspirin 81 MG tablet Take 81 mg by mouth daily.      Marland Kitchen atenolol (TENORMIN) 100 MG tablet Take 100 mg by mouth daily.        Marland Kitchen atenolol (TENORMIN) 50 MG tablet Take 1 tablet (50 mg total) by mouth at bedtime.  30 tablet  1  . fexofenadine (ALLEGRA) 180 MG tablet Take 180 mg by mouth daily as needed. For allergies       . irbesartan (AVAPRO) 300 MG tablet Take 300 mg by mouth daily.        . meclizine (ANTIVERT) 12.5 MG tablet Take 12.5 mg by mouth 3 (three) times daily as needed.       . RABEprazole (ACIPHEX) 20 MG tablet Take 20 mg by mouth daily.      . sodium chloride (OCEAN) 0.65 % nasal spray Place 1 spray into both nostrils daily as needed. For allergies      . triamterene-hydrochlorothiazide (MAXZIDE-25) 37.5-25 MG per tablet Take 0.5-1 tablets by mouth daily. Take 0.5 tablets on Mon, Tues, Thurs, Fri and Sat, and take 1 tablet on Wed and Sun        No results found for this or any previous visit (from the past 48 hour(s)). No results found.  Review of systems as noted above otherwise unremarkable.  Blood pressure 142/83, pulse 80, temperature 97.6 F (36.4 C), temperature source Oral, resp. rate 14, height  5\' 3"  (1.6 m), weight 190 lb 3.2 oz (86.274 kg), SpO2 100.00%. Well-developed well-nourished black female presently in no acute distress. HEENT: Head normocephalic atraumatic. Extraocular muscles are intact. No sclera icterus. No sinus tenderness. Disc flat. TMs without erythema. Nose mild turbinate edema. Posterior pharynx clear. NECK: No enlarged thyroid. No posterior cervical nodes. LUNGS: Clear to auscultation. No vocal fremitus. No wheezes. No CVA tenderness. CV: Irregular rhythm. Normal S1, S2. No rub appreciated.1/6 systolic ejection murmur heard loudest in second left ICS. ABDOMEN: No masses or tenderness. EXTREMITIES: Prominent varicosities bilaterally. Positive for left calf  Homans. Negative left. The trace dorsalis pedis pulses bilaterally. NEURO: Nonfocal.  Assessment/Plan Acute deep venous thrombosis left lower extremity. Atrial fibrillation. Exertional dyspnea. Rule out secondary to #1 and 2. Rule out pulmonary embolus. Hypertension stable. Congestive heart failure history. Patient has had elevated BNP though with 2-D echo with good LV function. Insulin resistance. Gastroesophageal reflux disease. History of vertigo on meclizine.  PLAN: Patient admitted to telemetry. She restarted on Lovenox plus Coumadin per pharmacy protocol. Patient education regarding Coumadin. Monitor patient for congestive heart. Symptoms. Maximize her medication. Further therapy pending response to the above.  Nadeem Romanoski 06/02/2012, 3:16 PM

## 2012-06-02 NOTE — Progress Notes (Signed)
ANTICOAGULATION CONSULT NOTE - Initial Consult  Pharmacy Consult for Lovenox/coumadin Indication: DVT  Allergies  Allergen Reactions  . Codeine   . Sulfa Antibiotics     Patient Measurements: Height: 5\' 3"  (160 cm) Weight: 190 lb 3.2 oz (86.274 kg) IBW/kg (Calculated) : 52.4    Vital Signs: Temp: 97.6 F (36.4 C) (06/25 1350) Temp src: Oral (06/25 1350) BP: 142/83 mmHg (06/25 1350) Pulse Rate: 80  (06/25 1350)  Labs: No results found for this basename: HGB:2,HCT:3,PLT:3,APTT:3,LABPROT:3,INR:3,HEPARINUNFRC:3,CREATININE:3,CKTOTAL:3,CKMB:3,TROPONINI:3 in the last 72 hours  Estimated Creatinine Clearance: 41.9 ml/min (by C-G formula based on Cr of 1.21).   Medical History: Past Medical History  Diagnosis Date  . Allergic rhinitis due to pollen   . Essential hypertension, malignant   . Pure hypercholesterolemia   . Heart murmur   . GERD (gastroesophageal reflux disease)   . Congestive heart failure, unspecified     patient states she is not aware of this  . Vertigo   . Atrial fibrillation with rapid ventricular response   . Dizziness   . Insulin resistance     History of insulin resistance.    Medications:  Scheduled:    . aspirin EC  81 mg Oral Daily  . docusate sodium  100 mg Oral BID  . enoxaparin  1 mg/kg Subcutaneous Q12H  . sodium chloride  3 mL Intravenous Q12H   Infusions:   PRN: sodium chloride, ondansetron (ZOFRAN) IV, ondansetron, sodium chloride  Assessment:  78 yo F with new LLE DVT to start D1/5 minimum coumadin/lovenox overlap.  Patient has a history of A.Fib but was not on any anticoagulation prior to admission.  Per Dr. Marlou Sa the patient refused treatment with anticoagulation.     Scr is pending, last Scr 12/23/11 was 1.21  Weight = 85 kg   Baseline INR 1.31  Goal of Therapy:  INR 2-3 Monitor platelets by anticoagulation protocol: Yes   Plan:  1.) Lovenox 85 mg SQ q12h, first dose after baseline labs drawn. 2.) Coumadin 7.5 mg po x 1  tonight at 1830 3.) Daily PT/INR 4.) CBC Q72 hr while on lovenox 5.) Coumadin education/book/video 6.) Continue lovenox for at least 5 days and until INR is therapeutic for >24 hours.   Ruta Capece, Gaye Alken PharmD 5:20 PM 06/02/2012

## 2012-06-02 NOTE — Progress Notes (Signed)
*  Preliminary Results* Left lower extremity venous duplex completed. Left lower extremity is positive for deep vein thrombosis involving the left posterior tibial veins.  06/02/2012 12:19 PM  Luna Kitchens, RDMS,RDCS

## 2012-06-03 LAB — PROTIME-INR
INR: 1.35 (ref 0.00–1.49)
Prothrombin Time: 16.9 seconds — ABNORMAL HIGH (ref 11.6–15.2)

## 2012-06-03 LAB — CARDIAC PANEL(CRET KIN+CKTOT+MB+TROPI)
CK, MB: 2.2 ng/mL (ref 0.3–4.0)
Relative Index: 1.9 (ref 0.0–2.5)
Total CK: 115 U/L (ref 7–177)
Troponin I: <0.3 ng/mL (ref <0.30)

## 2012-06-03 MED ORDER — WARFARIN SODIUM 7.5 MG PO TABS
7.5000 mg | ORAL_TABLET | Freq: Once | ORAL | Status: AC
  Administered 2012-06-03: 7.5 mg via ORAL
  Filled 2012-06-03: qty 1

## 2012-06-03 NOTE — Progress Notes (Addendum)
Jasmine [name] for Lovenox/coumadin Indication: DVT  Allergies  Allergen Reactions  . Codeine   . Sulfa Antibiotics     Patient Measurements: Height: 5\' 3"  (160 cm) Weight: 186 lb 11.7 oz (84.7 kg) (stand up scale) IBW/kg (Calculated) : 52.4    Vital Signs: Temp: 97.5 F (36.4 C) (06/26 0450) Temp src: Oral (06/26 0450) BP: 146/99 mmHg (06/26 0450) Pulse Rate: 73  (06/26 0450)  Labs:  Basename 06/03/12 0445 06/02/12 1623  HGB -- 10.6*  HCT -- 32.5*  PLT -- 181  APTT -- --  LABPROT 16.9* 16.5*  INR 1.35 1.31  HEPARINUNFRC -- --  CREATININE -- 1.19*  CKTOTAL 115 --  CKMB 2.2 --  TROPONINI <0.30 --    Estimated Creatinine Clearance: 42.1 ml/min (by C-G formula based on Cr of 1.19).   Medical History: Past Medical History  Diagnosis Date  . Allergic rhinitis due to pollen   . Essential hypertension, malignant   . Pure hypercholesterolemia   . Heart murmur   . GERD (gastroesophageal reflux disease)   . Congestive heart failure, unspecified     patient states she is not aware of this  . Vertigo   . Atrial fibrillation with rapid ventricular response   . Dizziness   . Insulin resistance     History of insulin resistance.    Medications:  Scheduled:     . aspirin EC  81 mg Oral Daily  . atenolol  100 mg Oral Daily  . atenolol  50 mg Oral QHS  . coumadin book   Does not apply Once  . docusate sodium  100 mg Oral BID  . enoxaparin  1 mg/kg Subcutaneous Q12H  . furosemide  20 mg Oral Daily  . irbesartan  300 mg Oral Daily  . loratadine  10 mg Oral Daily  . pantoprazole  40 mg Oral Q1200  . sodium chloride  3 mL Intravenous Q12H  . warfarin  7.5 mg Oral ONCE-1800  . warfarin   Does not apply Once  . Warfarin - Pharmacist Dosing Inpatient   Does not apply q1800  . DISCONTD: triamterene-hydrochlorothiazide  1 each Oral Daily   Infusions:   PRN: sodium chloride, meclizine, ondansetron (ZOFRAN) IV, ondansetron, sodium  chloride, sodium chloride  Assessment:  78 yo F with new LLE DVT on day 2/5 minimum coumadin/lovenox overlap.  Patient has a history of A.Fib but was not on any anticoagulation prior to admission.  Per Dr. Marlou Sa the patient refused treatment with anticoagulation.     Scr 1.19, appears near baseline, CrCl~42 ml/min.  Weight = 85 kg   Baseline INR 1.31  Goal of Therapy:  INR 2-3 Monitor platelets by anticoagulation protocol: Yes   Plan:  1.) Lovenox 85 mg SQ q12h, first dose after baseline labs drawn. 2.) Coumadin 7.5 mg po x 1 tonight 3.) Daily PT/INR 4.) CBC Q72 hr while on lovenox 5.) Coumadin education to follow 6.) Continue lovenox for at least 5 days and until INR is therapeutic for >24 hours.   Hershal Coria PharmD 9:30 AM 06/03/2012    Addendum: 06/03/2012 9:59 AM Educated patient on use and safety of warfarin today.  Patient was pleasant, asked questions, and appeared to have a good understanding of expectations of warfarin therapy.  Hershal Coria, PharmD, BCPS Pager: [phone] 06/03/2012 10:01 AM

## 2012-06-03 NOTE — Progress Notes (Signed)
Pt had a 2.03 sec pause. MD notified, new orders given and carried out. Will continue to monitor pt.

## 2012-06-03 NOTE — Progress Notes (Signed)
Subjective:  Patient feeling better. No chest or leg lpain. She is receiving education regarding coumadin. Patient notedd to have a 10.2 second pause on telemetry this am. Patient asleep and asymptomatic. No new complaiints.   Allergies  Allergen Reactions  . Codeine   . Sulfa Antibiotics    Current Facility-Administered Medications  Medication Dose Route Frequency Provider Last Rate Last Dose  . 0.9 %  sodium chloride infusion  250 mL Intravenous PRN [name], MD      . aspirin EC tablet 81 mg  81 mg Oral Daily [name], MD   81 mg at 06/03/12 1033  . atenolol (TENORMIN) tablet 100 mg  100 mg Oral Daily [name], MD   100 mg at 06/03/12 1033  . coumadin book   Does not apply Once Gaye Alken [place], PHARMD      . docusate sodium (COLACE) capsule 100 mg  100 mg Oral BID [name], MD   100 mg at 06/03/12 1034  . enoxaparin (LOVENOX) injection 85 mg  1 mg/kg Subcutaneous Q12H [name], MD   85 mg at 06/03/12 1659  . furosemide (LASIX) tablet 20 mg  20 mg Oral Daily [name], MD   20 mg at 06/03/12 1033  . irbesartan (AVAPRO) tablet 300 mg  300 mg Oral Daily [name], MD   300 mg at 06/03/12 1033  . loratadine (CLARITIN) tablet 10 mg  10 mg Oral Daily [name], MD   10 mg at 06/03/12 1033  . meclizine (ANTIVERT) tablet 12.5 mg  12.5 mg Oral TID PRN [name], MD      . ondansetron [facility]) tablet 4 mg  4 mg Oral Q6H PRN [name], MD       Or  . ondansetron [facility]) injection 4 mg  4 mg Intravenous Q6H PRN [name], MD      . pantoprazole (PROTONIX) EC tablet 40 mg  40 mg Oral Q1200 [name], MD   40 mg at 06/03/12 1139  . sodium chloride (OCEAN) 0.65 % nasal spray 1 spray  1 spray Each Nare PRN [name], MD      . sodium chloride 0.9 % injection 3 mL  3 mL Intravenous Q12H [name], MD   3 mL at 06/03/12 1139  . sodium chloride 0.9 % injection 3 mL  3 mL Intravenous PRN [name], MD      . warfarin (COUMADIN) tablet 7.5 mg  7.5 mg Oral  ONCE-1800 Gaye Alken [place], PHARMD   7.5 mg at 06/02/12 1845  . warfarin (COUMADIN) tablet 7.5 mg  7.5 mg Oral ONCE-1800 Donald Prose [place], PHARMD   7.5 mg at 06/03/12 1700  . warfarin (COUMADIN) video   Does not apply Once Gaye Alken [place], PHARMD      . Warfarin - Pharmacist Dosing Inpatient   Does not apply q1800 Gaye Alken [place], [place]: atenolol (TENORMIN) tablet 50 mg  50 mg Oral QHS [name], MD   50 mg at 06/02/12 2127  . DISCONTD: triamterene-hydrochlorothiazide (MAXZIDE-25) 37.5-25 MG per tablet 1 each  1 each Oral Daily [name], MD        Objective: Blood pressure 111/64, pulse 85, temperature 97.5 F (36.4 C), temperature source Oral, resp. rate 18, height 5\' 3"  (1.6 m), weight 186 lb 11.7 oz (84.7 kg), SpO2 99.00%.  WDWN black  female in no acute distress. HEENT: No sinus tenderness. NECK: No enlarged thyroid. No posterior cervical nodes. LUNGS: Clear to auscultation. No wheezes or rails. CV: Irregular rhythm, normal S1, S2. One of 6 systolic ejection murmur no lower left heart border. ABD: Soft, nontender. MSK: Residual tenderness and left lower lower extremity. Equivocal Homans in the left leg. Negative on right. NEURO: Intact.  Lab results: Results for orders placed during the hospital encounter of 06/02/12 (from the past 48 hour(s))  COMPREHENSIVE METABOLIC PANEL     Status: Abnormal   Collection Time   06/02/12  4:23 PM      Component Value Range Comment   Sodium 134 (*) 135 - 145 mEq/L    Potassium 4.9  3.5 - 5.1 mEq/L    Chloride 101  96 - 112 mEq/L    CO2 24  19 - 32 mEq/L    Glucose, Bld 87  70 - 99 mg/dL    BUN 30 (*) 6 - 23 mg/dL    Creatinine, Ser 1.19 (*) 0.50 - 1.10 mg/dL    Calcium 9.5  8.4 - 10.5 mg/dL    Total Protein 7.0  6.0 - 8.3 g/dL    Albumin 3.6  3.5 - 5.2 g/dL    AST 67 (*) 0 - 37 U/L    ALT 82 (*) 0 - 35 U/L    Alkaline Phosphatase 149 (*) 39 - 117 U/L    Total Bilirubin 0.8  0.3 - 1.2 mg/dL     GFR calc non Af Amer 44 (*) >90 mL/min    GFR calc Af Amer 50 (*) >90 mL/min   MAGNESIUM     Status: Normal   Collection Time   06/02/12  4:23 PM      Component Value Range Comment   Magnesium 2.1  1.5 - 2.5 mg/dL   CBC     Status: Abnormal   Collection Time   06/02/12  4:23 PM      Component Value Range Comment   WBC 5.0  4.0 - 10.5 K/uL    RBC 3.59 (*) 3.87 - 5.11 MIL/uL    Hemoglobin 10.6 (*) 12.0 - 15.0 g/dL    HCT 32.5 (*) 36.0 - 46.0 %    MCV 90.5  78.0 - 100.0 fL    MCH 29.5  26.0 - 34.0 pg    MCHC 32.6  30.0 - 36.0 g/dL    RDW 15.4  11.5 - 15.5 %    Platelets 181  150 - 400 K/uL   DIFFERENTIAL     Status: Abnormal   Collection Time   06/02/12  4:23 PM      Component Value Range Comment   Neutrophils Relative 57  43 - 77 %    Neutro Abs 2.9  1.7 - 7.7 K/uL    Lymphocytes Relative 30  12 - 46 %    Lymphs Abs 1.5  0.7 - 4.0 K/uL    Monocytes Relative 9  3 - 12 %    Monocytes Absolute 0.5  0.1 - 1.0 K/uL    Eosinophils Relative 2  0 - 5 %    Eosinophils Absolute 0.1  0.0 - 0.7 K/uL    Basophils Relative 2 (*) 0 - 1 %    Basophils Absolute 0.1  0.0 - 0.1 K/uL   PROTIME-INR     Status: Abnormal   Collection Time   06/02/12  4:23 PM      Component Value Range Comment  Prothrombin Time 16.5 (*) 11.6 - 15.2 seconds    INR 1.31  0.00 - 1.49   PROTIME-INR     Status: Abnormal   Collection Time   06/03/12  4:45 AM      Component Value Range Comment   Prothrombin Time 16.9 (*) 11.6 - 15.2 seconds    INR 1.35  0.00 - 1.49   CARDIAC PANEL(CRET KIN+CKTOT+MB+TROPI)     Status: Normal   Collection Time   06/03/12  4:45 AM      Component Value Range Comment   Total CK 115  7 - 177 U/L    CK, MB 2.2  0.3 - 4.0 ng/mL    Troponin I <0.30  <0.30 ng/mL    Relative Index 1.9  0.0 - 2.5     Studies/Results: No results found.  Patient Active Problem List  Diagnosis  . Atrial fibrillation  . Hypertension    Impression: Left leg DVT Atrial fibrillation. Congestive Heart  Failure with history of elevated BNP. Transient bradycardia versus sinus pause. Patient been on twice a day beta blocker. Mild anemia. Question of etiology.   Plan: Decrease atenolol to once daily. Continue therapy. Anemia panel. Thyroid panel. Follow up with Cardiology.   Marlou Sa, Wayman Hoard 06/03/2012 5:46 PM

## 2012-06-04 LAB — IRON AND TIBC
Saturation Ratios: 13 % — ABNORMAL LOW (ref 20–55)
UIBC: 275 ug/dL (ref 125–400)

## 2012-06-04 LAB — RETICULOCYTES
RBC.: 3.75 MIL/uL — ABNORMAL LOW (ref 3.87–5.11)
Retic Ct Pct: 2.6 % (ref 0.4–3.1)

## 2012-06-04 LAB — FOLATE: Folate: 10.5 ng/mL

## 2012-06-04 LAB — FERRITIN: Ferritin: 243 ng/mL (ref 10–291)

## 2012-06-04 LAB — PROTIME-INR: Prothrombin Time: 24.1 seconds — ABNORMAL HIGH (ref 11.6–15.2)

## 2012-06-04 MED ORDER — FUROSEMIDE 20 MG PO TABS
20.0000 mg | ORAL_TABLET | Freq: Every day | ORAL | Status: DC
  Administered 2012-06-05 – 2012-06-11 (×7): 20 mg via ORAL
  Filled 2012-06-04 (×7): qty 1

## 2012-06-04 MED ORDER — SPIRONOLACTONE 25 MG PO TABS
25.0000 mg | ORAL_TABLET | Freq: Every day | ORAL | Status: DC
  Administered 2012-06-04 – 2012-06-11 (×8): 25 mg via ORAL
  Filled 2012-06-04 (×9): qty 1

## 2012-06-04 MED ORDER — OFF THE BEAT BOOK
Freq: Once | Status: AC
  Administered 2012-06-04: 23:00:00
  Filled 2012-06-04: qty 1

## 2012-06-04 MED ORDER — FUROSEMIDE 40 MG PO TABS: 40.0000 mg | ORAL_TABLET | Freq: Every day | ORAL | Status: DC

## 2012-06-04 NOTE — Progress Notes (Signed)
[place] for Lovenox/coumadin Indication: DVT  Allergies  Allergen Reactions  . Codeine   . Sulfa Antibiotics     Patient Measurements: Height: 5\' 3"  (160 cm) Weight: 186 lb 4.6 oz (84.5 kg) IBW/kg (Calculated) : 52.4    Vital Signs: Temp: 97.3 F (36.3 C) (06/27 0533) Temp src: Oral (06/27 0533) BP: 123/81 mmHg (06/27 0533) Pulse Rate: 83  (06/27 0533)  Labs:  Jasmine [name] 06/04/12 0524 06/03/12 0445 06/02/12 1623  HGB -- -- 10.6*  HCT -- -- 32.5*  PLT -- -- 181  APTT -- -- --  LABPROT 24.1* 16.9* 16.5*  INR 2.12* 1.35 1.31  HEPARINUNFRC -- -- --  CREATININE -- -- 1.19*  CKTOTAL -- 115 --  CKMB -- 2.2 --  TROPONINI -- <0.30 --    Estimated Creatinine Clearance: 42 ml/min (by C-G formula based on Cr of 1.19).   Assessment:  78 yo F with new LLE DVT on day 3/5 minimum coumadin/lovenox overlap.  Patient has a history of A.Fib but was not on any anticoagulation prior to admission.  Per Dr. Marlou Sa the patient refused treatment with anticoagulation.     Scr 1.19, appears near baseline, CrCl~42 ml/min.  Weight = 85 kg   INR in therapeutic range today already, increase was too dramatic overnight so will hold dose today in anticipation of further increase.  7.5 mg dose is too high for patient.  Goal of Therapy:  INR 2-3 Monitor platelets by anticoagulation protocol: Yes   Plan:  1.) Continue Lovenox 85 mg SQ q12h. 2.) No warfarin dose tonight. 3.) Daily PT/INR 4.) CBC Q72 hr while on lovenox 5.) Coumadin education completed 6/26 6.) Continue lovenox for at least 5 days and until INR is therapeutic for >24 hours.   Jasmine [name] PharmD 9:03 AM 06/04/2012

## 2012-06-04 NOTE — Progress Notes (Signed)
*  PRELIMINARY RESULTS* Echocardiogram 2D Echocardiogram has been performed.  Jasmine [name] 06/04/2012, 4:36 PM

## 2012-06-04 NOTE — Care Management Note (Signed)
    Page 1 of 2   06/12/2012     1:46:36 PM   CARE MANAGEMENT NOTE 06/12/2012  Patient:  Jasmine [name], Jasmine [name]   Account Number:  [id number]  Date Initiated:  06/04/2012  Documentation initiated by:  Dessa Phi  Subjective/Objective Assessment:   ADMITTED W/L LEG DVT.     Action/Plan:   FROM HOME W/SUPPORT.   Anticipated DC Date:  06/11/2012   Anticipated DC Plan:  [place]  CM consult      Choice offered to / List presented to:  C-1 Patient        [place] arranged  HH-1 RN      [place].   Status of service:  Completed, signed off Medicare Important Message given?   (If response is "NO", the following Medicare IM given date fields will be blank) Date Medicare IM given:   Date Additional Medicare IM given:    Discharge Disposition:  [place]  Per UR Regulation:  Reviewed for med. necessity/level of care/duration of stay  If discussed at [place] of Stay Meetings, dates discussed:   06/10/2012    Comments:  06/12/12 Seini Lannom RN,BSN NCM [place] SUSAN(LIASON)THAT PATIENT REFUSED [place] VISIT ON 06/12/12 FOR INR-LAB DRAW,THEY HAVE CONTACTED MD.  06/10/12 Velta Rockholt RN,BSN NCM [phone] [facility] CHOSEN FOR HHRN-MONITORING INR,CAN DO LAB DRAWS TOMORROW IF NEEDED.IF MD AGREE WILL NEED HHRN ORDER.  06/08/12 Lizmary Nader RN,BSN  NCM [id number] CONTINUE TO MONITOR PROGRESS.  06/04/12 Denyce Harr RN,BSN NCM [phone] PATIENT HAS LOVENOX COVERAGE IF NEEDED FOR HOME. WILL CHECK BENEFIT FOR LOVENOX COVERAGE IF NEEDED @ D/C.

## 2012-06-04 NOTE — Progress Notes (Signed)
Subjective:  Appreciate cardiology input by Dr. Tamala Julian. Patient reports feeling better today. She denies headaches, chest pains or shortness of breath. He is on had occasional left leg pain with activity. She's otherwise been doing well.   Allergies  Allergen Reactions  . Codeine   . Sulfa Antibiotics    Current Facility-Administered Medications  Medication Dose Route Frequency Provider Last Rate Last Dose  . 0.9 %  sodium chloride infusion  250 mL Intravenous PRN [name], MD      . aspirin EC tablet 81 mg  81 mg Oral Daily [name], MD   81 mg at 06/04/12 1002  . atenolol (TENORMIN) tablet 100 mg  100 mg Oral Daily [name], MD   100 mg at 06/04/12 1002  . docusate sodium (COLACE) capsule 100 mg  100 mg Oral BID [name], MD   100 mg at 06/04/12 1002  . enoxaparin (LOVENOX) injection 85 mg  1 mg/kg Subcutaneous Q12H [name], MD   85 mg at 06/04/12 1611  . furosemide (LASIX) tablet 20 mg  20 mg Oral Daily Belva Crome III, MD      . irbesartan (AVAPRO) tablet 300 mg  300 mg Oral Daily [name], MD   300 mg at 06/04/12 1002  . loratadine (CLARITIN) tablet 10 mg  10 mg Oral Daily [name], MD   10 mg at 06/04/12 1002  . meclizine (ANTIVERT) tablet 12.5 mg  12.5 mg Oral TID PRN [name], MD      . ondansetron [facility]) tablet 4 mg  4 mg Oral Q6H PRN [name], MD       Or  . ondansetron [facility]) injection 4 mg  4 mg Intravenous Q6H PRN [name], MD      . pantoprazole (PROTONIX) EC tablet 40 mg  40 mg Oral Q1200 [name], MD   40 mg at 06/04/12 1159  . sodium chloride (OCEAN) 0.65 % nasal spray 1 spray  1 spray Each Nare PRN [name], MD      . sodium chloride 0.9 % injection 3 mL  3 mL Intravenous Q12H [name], MD   3 mL at 06/04/12 1002  . sodium chloride 0.9 % injection 3 mL  3 mL Intravenous PRN [name], MD      . spironolactone (ALDACTONE) tablet 25 mg  25 mg Oral Daily Sinclair Grooms, MD      . Warfarin - Pharmacist Dosing Inpatient    Does not apply [place], [place]: furosemide (LASIX) tablet 20 mg  20 mg Oral Daily [name], MD   20 mg at 06/04/12 1002  . DISCONTD: furosemide (LASIX) tablet 40 mg  40 mg Oral Daily Belva Crome III, MD        Objective: Blood pressure 148/85, pulse 74, temperature 97.7 F (36.5 C), temperature source Oral, resp. rate 18, height 5\' 3"  (1.6 m), weight 186 lb 4.6 oz (84.5 kg), SpO2 100.00%.  Well-developed well-nourished black female in no acute distress. HEENT:no sinus tenderness. NECK:no enlarged thyroid. No posterior cervical nodes. LUNGS:clear to auscultation. No wheezes or rales. No CVA tenderness. [id number] rhythm. Normal S1, S2. ABD:no epigastric tenderness. Dullness lower quadrants. [id number] Homans in right leg. Equivocal Homans left leg. NEURO:intact.  Lab results: Results for orders placed during the hospital encounter of 06/02/12 (from the past 48  hour(s))  PROTIME-INR     Status: Abnormal   Collection Time   06/03/12  4:45 AM      Component Value Range Comment   Prothrombin Time 16.9 (*) 11.6 - 15.2 seconds    INR 1.35  0.00 - 1.49   CARDIAC PANEL(CRET KIN+CKTOT+MB+TROPI)     Status: Normal   Collection Time   06/03/12  4:45 AM      Component Value Range Comment   Total CK 115  7 - 177 U/L    CK, MB 2.2  0.3 - 4.0 ng/mL    Troponin I <0.30  <0.30 ng/mL    Relative Index 1.9  0.0 - 2.5   PROTIME-INR     Status: Abnormal   Collection Time   06/04/12  5:24 AM      Component Value Range Comment   Prothrombin Time 24.1 (*) 11.6 - 15.2 seconds    INR 2.12 (*) 0.00 - 1.49   VITAMIN B12     Status: Normal   Collection Time   06/04/12  5:24 AM      Component Value Range Comment   Vitamin B-12 628  211 - 911 pg/mL   FOLATE     Status: Normal   Collection Time   06/04/12  5:24 AM      Component Value Range Comment   Folate 10.5     IRON AND TIBC     Status: Abnormal   Collection Time   06/04/12  5:24 AM      Component  Value Range Comment   Iron 42  42 - 135 ug/dL    TIBC 317  250 - 470 ug/dL    Saturation Ratios 13 (*) 20 - 55 %    UIBC 275  125 - 400 ug/dL   FERRITIN     Status: Normal   Collection Time   06/04/12  5:24 AM      Component Value Range Comment   Ferritin 243  10 - 291 ng/mL   RETICULOCYTES     Status: Abnormal   Collection Time   06/04/12  5:24 AM      Component Value Range Comment   Retic Ct Pct 2.6  0.4 - 3.1 %    RBC. 3.75 (*) 3.87 - 5.11 MIL/uL    Retic Count, Manual 97.5  19.0 - 186.0 K/uL   TSH     Status: Abnormal   Collection Time   06/04/12  5:24 AM      Component Value Range Comment   TSH 7.793 (*) 0.350 - 4.500 uIU/mL   T4, FREE     Status: Normal   Collection Time   06/04/12  5:24 AM      Component Value Range Comment   Free T4 0.96  0.80 - 1.80 ng/dL     Studies/Results: No results found.  Patient Active Problem List  Diagnosis  . Atrial fibrillation  . Hypertension    Impression: DVT of left leg. Patient rapid rise in INR. Pharmacy adjusting. Atrial fibrillation with transient sinus pulse. No further occurrence with decrease in p.m. Atenolol. Elevated proBNP with diastolic dysfunction. Hypertension. Mild hypothyroidism. Mild elevation of TSH. Insulin resistance history.   Plan: Continue adjusting Coumadin per pharmacy protocol. Continue Lasix with discontinuation of Maxzide. Low-dose Synthroid as tolerated. Followup in a.m.   Marlou Sa, Cyani Kallstrom 06/04/2012 7:30 PM

## 2012-06-04 NOTE — Consult Note (Signed)
Admit date: 06/02/2012 Referring Physician   Kevan Ny, MD Primary Physician  Kevan Ny, MD Primary Cardiologist : Daneen Schick, III, MD Reason for Consultation: ? Diastolic heart failure  ASSESSMENT: 1. Acute on chronic diastolic heart failure, compensated 2. Chronic atrial fibrillation with good rate control. CHADS score is 2 3. Hypertension 4. Acute DVT  PLAN: 1. Chronic anticoagulation is recommended for stroke prevention 2. More aggressive diuresis to treat CHF as renalfunction allows.. Could add aldactone if lasix 20 or 40 mg daily is inadequate. Stop triamterene Hctz.  3. Hgb A1C 4.Daily BMET  HPI: Admitted with progressive lower extremity edema and found to have acute left lower DVT. Also noted to have elevated BNP. Echo shows LVH and normal EF. Che complains of exertionaal dyspnea.   PMH:   Past Medical History  Diagnosis Date  . Allergic rhinitis due to pollen   . Essential hypertension, malignant   . Pure hypercholesterolemia   . Heart murmur   . GERD (gastroesophageal reflux disease)   . Congestive heart failure, unspecified     patient states she is not aware of this  . Vertigo   . Atrial fibrillation with rapid ventricular response   . Dizziness   . Insulin resistance     History of insulin resistance.     PSH:   Past Surgical History  Procedure Date  . Cholecystectomy   . Partial hysterectomy   . Appendectomy   . Breast biopsy     Allergies:  Codeine and Sulfa antibiotics Prior to Admit Meds:   Prescriptions prior to admission  Medication Sig Dispense Refill  . aspirin 81 MG tablet Take 81 mg by mouth daily.      Marland Kitchen atenolol (TENORMIN) 100 MG tablet Take 100 mg by mouth daily.        Marland Kitchen atenolol (TENORMIN) 50 MG tablet Take 1 tablet (50 mg total) by mouth at bedtime.  30 tablet  1  . fexofenadine (ALLEGRA) 180 MG tablet Take 180 mg by mouth daily as needed. For allergies       . irbesartan (AVAPRO) 300 MG tablet Take 300 mg by mouth daily.        .  meclizine (ANTIVERT) 12.5 MG tablet Take 12.5 mg by mouth 3 (three) times daily as needed.       . RABEprazole (ACIPHEX) 20 MG tablet Take 20 mg by mouth daily.      . sodium chloride (OCEAN) 0.65 % nasal spray Place 1 spray into both nostrils daily as needed. For allergies      . triamterene-hydrochlorothiazide (MAXZIDE-25) 37.5-25 MG per tablet Take 0.5-1 tablets by mouth daily. Take 0.5 tablets on Mon, Tues, Thurs, Fri and Sat, and take 1 tablet on Wed and Sun       Fam HX:   History reviewed. No pertinent family history. Social HX:    History   Social History  . Marital Status: Single    Spouse Name: N/A    Number of Children: N/A  . Years of Education: N/A   Occupational History  . Not on file.   Social History Main Topics  . Smoking status: Never Smoker   . Smokeless tobacco: Never Used  . Alcohol Use: No  . Drug Use: No  . Sexually Active: Not Currently    Birth Control/ Protection: Post-menopausal   Other Topics Concern  . Not on file   Social History Narrative  . No narrative on file     Review of Systems:  No significant features  Physical Exam: Blood pressure 148/85, pulse 74, temperature 97.7 F (36.5 C), temperature source Oral, resp. rate 18, height 5\' 3"  (1.6 m), weight 84.5 kg (186 lb 4.6 oz), SpO2 100.00%. Weight change: -1.774 kg (-3 lb 14.6 oz)  Irregularly irreular rhythm. No murmur. Clear lungs. No Left 1+ edema and trace left edema. Labs:   Lab Results  Component Value Date   WBC 5.0 06/02/2012   HGB 10.6* 06/02/2012   HCT 32.5* 06/02/2012   MCV 90.5 06/02/2012   PLT 181 06/02/2012    Lab 06/02/12 1623  NA 134*  K 4.9  CL 101  CO2 24  BUN 30*  CREATININE 1.19*  CALCIUM 9.5  PROT 7.0  BILITOT 0.8  ALKPHOS 149*  ALT 82*  AST 67*  GLUCOSE 87   No results found for this basename: PTT   Lab Results  Component Value Date   INR 2.12* 06/04/2012   INR 1.35 06/03/2012   INR 1.31 06/02/2012   Lab Results  Component Value Date   CKTOTAL 115  06/03/2012   CKMB 2.2 06/03/2012   TROPONINI <0.30 06/03/2012   BNP    Component Value Date/Time   PROBNP 1113.0* 12/23/2011 2008     Radiology: no chest x ray  Lower extremity doppler:  Summary: Findings consistent with acute deep vein thrombosis involving the left posterial tibial vein in the proximal and mid segments.  Other specific details can be found in the table(s) above. Prepared and Electronically Authenticated by  Deitra Mayo, MD 2013-06-25T15:[id number]  EKG:   Atrial fibrillation with controlled rate    Sinclair Grooms 06/04/2012 6:23 PM

## 2012-06-05 LAB — BASIC METABOLIC PANEL
BUN: 28 mg/dL — ABNORMAL HIGH (ref 6–23)
CO2: 23 mEq/L (ref 19–32)
Calcium: 9.4 mg/dL (ref 8.4–10.5)
Glucose, Bld: 91 mg/dL (ref 70–99)
Potassium: 3.7 mEq/L (ref 3.5–5.1)
Sodium: 136 mEq/L (ref 135–145)

## 2012-06-05 LAB — CBC
HCT: 35.4 % — ABNORMAL LOW (ref 36.0–46.0)
MCH: 29.9 pg (ref 26.0–34.0)
MCV: 91.2 fL (ref 78.0–100.0)
RDW: 15.7 % — ABNORMAL HIGH (ref 11.5–15.5)
WBC: 5.9 10*3/uL (ref 4.0–10.5)

## 2012-06-05 MED ORDER — ATENOLOL 25 MG PO TABS
25.0000 mg | ORAL_TABLET | Freq: Every day | ORAL | Status: DC
  Administered 2012-06-05 – 2012-06-09 (×5): 25 mg via ORAL
  Filled 2012-06-05 (×5): qty 1

## 2012-06-05 MED ORDER — ATENOLOL 100 MG PO TABS: 100.0000 mg | ORAL_TABLET | Freq: Every day | ORAL | Status: DC

## 2012-06-05 MED ORDER — ATENOLOL 100 MG PO TABS
100.0000 mg | ORAL_TABLET | Freq: Every day | ORAL | Status: DC
  Administered 2012-06-06: 100 mg via ORAL

## 2012-06-05 NOTE — Progress Notes (Signed)
Patient had 5 beats of vtach. Patient was resting at the time and asymptomatic. MD was notified. Will continue to monitor. Setzer, Marchelle Folks

## 2012-06-05 NOTE — Progress Notes (Signed)
Subjective:  Patient feeling well. She experienced 5 beats of V. Tach this morning. Patient was asymptomatic. Minimal ambulation. PT/INR  still elevated after initial dose. Of note, Patient's twin sister is managed with 1.5 mg/3 mg of coumadin. No new complaints.   Allergies  Allergen Reactions  . Codeine   . Sulfa Antibiotics    Current Facility-Administered Medications  Medication Dose Route Frequency Provider Last Rate Last Dose  . 0.9 %  sodium chloride infusion  250 mL Intravenous PRN [name], MD      . aspirin EC tablet 81 mg  81 mg Oral Daily [name], MD   81 mg at 06/05/12 0912  . atenolol (TENORMIN) tablet 100 mg  100 mg Oral Daily [name], MD      . atenolol (TENORMIN) tablet 25 mg  25 mg Oral QAC supper [name], MD   25 mg at 06/05/12 2056  . docusate sodium (COLACE) capsule 100 mg  100 mg Oral BID [name], MD   100 mg at 06/05/12 0911  . enoxaparin (LOVENOX) injection 85 mg  1 mg/kg Subcutaneous Q12H [name], MD   85 mg at 06/05/12 1701  . furosemide (LASIX) tablet 20 mg  20 mg Oral Daily Belva Crome III, MD   20 mg at 06/05/12 0912  . irbesartan (AVAPRO) tablet 300 mg  300 mg Oral Daily [name], MD   300 mg at 06/05/12 0911  . loratadine (CLARITIN) tablet 10 mg  10 mg Oral Daily [name], MD   10 mg at 06/05/12 0912  . meclizine (ANTIVERT) tablet 12.5 mg  12.5 mg Oral TID PRN [name], MD      . off the beat book   Does not apply Once [name], MD      . ondansetron [facility]) tablet 4 mg  4 mg Oral Q6H PRN [name], MD       Or  . ondansetron [facility]) injection 4 mg  4 mg Intravenous Q6H PRN [name], MD      . pantoprazole (PROTONIX) EC tablet 40 mg  40 mg Oral Q1200 [name], MD   40 mg at 06/05/12 1212  . sodium chloride (OCEAN) 0.65 % nasal spray 1 spray  1 spray Each Nare PRN [name], MD      . sodium chloride 0.9 % injection 3 mL  3 mL Intravenous Q12H [name], MD   3 mL at 06/05/12 0912  . sodium chloride 0.9 %  injection 3 mL  3 mL Intravenous PRN [name], MD      . spironolactone (ALDACTONE) tablet 25 mg  25 mg Oral Daily Belva Crome III, MD   25 mg at 06/05/12 0912  . Warfarin - Pharmacist Dosing Inpatient   Does not apply [place], [place]: atenolol (TENORMIN) tablet 100 mg  100 mg Oral Daily [name], MD   100 mg at 06/05/12 0911  . DISCONTD: atenolol (TENORMIN) tablet 100 mg  100 mg Oral Daily [name], MD        Objective: Blood pressure 110/76, pulse 88, temperature 97.5 F (36.4 C), temperature source Oral, resp. rate 18, height 5\' 3"  (1.6 m), weight 186 lb 1.1 oz (84.4 kg), SpO2 99.00%.  WDWN black female in no acute distress. HEENT:no sinus tenderness. NECK:no posterior cervical nodes. LUNGS:clear  [id number] S1, S2. ABD:no epigastric tenderness. [id number] Homans. Minimal tenderness in the left calf. NEURO:nonfocal. Lab results: Results for orders placed during the hospital encounter of 06/02/12 (from the past 48 hour(s))  PROTIME-INR     Status: Abnormal   Collection Time   06/04/12  5:24 AM      Component Value Range Comment   Prothrombin Time 24.1 (*) 11.6 - 15.2 seconds    INR 2.12 (*) 0.00 - 1.49   VITAMIN B12     Status: Normal   Collection Time   06/04/12  5:24 AM      Component Value Range Comment   Vitamin B-12 628  211 - 911 pg/mL   FOLATE     Status: Normal   Collection Time   06/04/12  5:24 AM      Component Value Range Comment   Folate 10.5     IRON AND TIBC     Status: Abnormal   Collection Time   06/04/12  5:24 AM      Component Value Range Comment   Iron 42  42 - 135 ug/dL    TIBC 317  250 - 470 ug/dL    Saturation Ratios 13 (*) 20 - 55 %    UIBC 275  125 - 400 ug/dL   FERRITIN     Status: Normal   Collection Time   06/04/12  5:24 AM      Component Value Range Comment   Ferritin 243  10 - 291 ng/mL   RETICULOCYTES     Status: Abnormal   Collection Time   06/04/12  5:24 AM      Component Value Range  Comment   Retic Ct Pct 2.6  0.4 - 3.1 %    RBC. 3.75 (*) 3.87 - 5.11 MIL/uL    Retic Count, Manual 97.5  19.0 - 186.0 K/uL   TSH     Status: Abnormal   Collection Time   06/04/12  5:24 AM      Component Value Range Comment   TSH 7.793 (*) 0.350 - 4.500 uIU/mL   T4, FREE     Status: Normal   Collection Time   06/04/12  5:24 AM      Component Value Range Comment   Free T4 0.96  0.80 - 1.80 ng/dL   CBC     Status: Abnormal   Collection Time   06/05/12  4:43 AM      Component Value Range Comment   WBC 5.9  4.0 - 10.5 K/uL    RBC 3.88  3.87 - 5.11 MIL/uL    Hemoglobin 11.6 (*) 12.0 - 15.0 g/dL    HCT 35.4 (*) 36.0 - 46.0 %    MCV 91.2  78.0 - 100.0 fL    MCH 29.9  26.0 - 34.0 pg    MCHC 32.8  30.0 - 36.0 g/dL    RDW 15.7 (*) 11.5 - 15.5 %    Platelets 190  150 - 400 K/uL   PROTIME-INR     Status: Abnormal   Collection Time   06/05/12  4:43 AM      Component Value Range Comment   Prothrombin Time 30.9 (*) 11.6 - 15.2 seconds    INR 2.91 (*) 0.00 - [id number]   BASIC METABOLIC PANEL     Status: Abnormal   Collection Time   06/05/12  4:43 AM      Component Value Range Comment   Sodium 136  135 - 145 mEq/L  Potassium 3.7  3.5 - 5.1 mEq/L    Chloride 103  96 - 112 mEq/L    CO2 23  19 - 32 mEq/L    Glucose, Bld 91  70 - 99 mg/dL    BUN 28 (*) 6 - 23 mg/dL    Creatinine, Ser 1.11 (*) 0.50 - 1.10 mg/dL    Calcium 9.4  8.4 - 10.5 mg/dL    GFR calc non Af Amer 47 (*) >90 mL/min    GFR calc Af Amer 55 (*) >90 mL/min     Studies/Results: No results found.  Patient Active Problem List  Diagnosis  . Atrial fibrillation  . Hypertension    Impression: DVT Atrial fibrillation with controlled rate. CHF. Mild hypothyroidism with elevated TSH.   Plan: Adjust coumadin per pharmacy.  Atenolol adjusted for tachycardia. Cardiology followup.    Marlou Sa, Jadin Kagel 06/05/2012 8:59 PM

## 2012-06-05 NOTE — Progress Notes (Signed)
[place] for Lovenox/coumadin Indication: DVT  Allergies  Allergen Reactions  . Codeine   . Sulfa Antibiotics     Patient Measurements: Height: 5\' 3"  (160 cm) Weight: 186 lb 1.1 oz (84.4 kg) IBW/kg (Calculated) : 52.4    Vital Signs: Temp: 97.4 F (36.3 C) (06/28 0548) Temp src: Oral (06/28 0548) BP: 107/71 mmHg (06/28 0911) Pulse Rate: 89  (06/28 0911)  Labs:  Flo Shanks 06/05/12 0443 06/04/12 0524 06/03/12 0445 06/02/12 1623  HGB 11.6* -- -- 10.6*  HCT 35.4* -- -- 32.5*  PLT 190 -- -- 181  APTT -- -- -- --  LABPROT 30.9* 24.1* 16.9* --  INR 2.91* 2.12* 1.35 --  HEPARINUNFRC -- -- -- --  CREATININE 1.11* -- -- 1.19*  CKTOTAL -- -- 115 --  CKMB -- -- 2.2 --  TROPONINI -- -- <0.30 --    Estimated Creatinine Clearance: 45.1 ml/min (by C-G formula based on Cr of 1.11).   Assessment:  78 yo F with new LLE DVT on day 4/5 minimum coumadin/lovenox overlap.  Patient has a history of A.Fib but was not on any anticoagulation prior to admission.  Per Dr. Marlou Sa the patient refused treatment with anticoagulation.     CrCl~45 ml/min, appears stable and near baseline  Weight = 85 kg   INR remains in therapeutic range today but increasing significantly.  Do not anticipate further dramatic increase since held warfarin dose yesterday but will hold warfarin again today to ensure that INR is not continuing to increase then hopefully restart tomorrow at low dose.  CBC ok. No bleeding/complications reported.  Goal of Therapy:  INR 2-3 Monitor platelets by anticoagulation protocol: Yes   Plan:  1.) Continue Lovenox 85 mg SQ q12h. 2.) No warfarin tonight. 3.) Daily PT/INR 4.) CBC Q72 hr while on lovenox 5.) Coumadin education completed 6/26 6.) Continue lovenox for at least 5 days and until INR is therapeutic for >24 hours.   Hershal Coria PharmD 10:23 AM 06/05/2012

## 2012-06-05 NOTE — Progress Notes (Signed)
Patient went to restroom and heart rate increased to >150 a.fib. BP 128/81 and HR 105 resting now.  Dr. Marlou Sa was notified. Plans are to give atenolol now.  No other new orders. Will continue to monitor patient. Setzer, Marchelle Folks

## 2012-06-06 LAB — BASIC METABOLIC PANEL
CO2: 23 mEq/L (ref 19–32)
Chloride: 102 mEq/L (ref 96–112)
Sodium: 134 mEq/L — ABNORMAL LOW (ref 135–145)

## 2012-06-06 LAB — PROTIME-INR: INR: 2.65 — ABNORMAL HIGH (ref 0.00–1.49)

## 2012-06-06 MED ORDER — WARFARIN SODIUM 5 MG PO TABS
5.0000 mg | ORAL_TABLET | Freq: Once | ORAL | Status: AC
  Administered 2012-06-06: 5 mg via ORAL
  Filled 2012-06-06: qty 1

## 2012-06-06 NOTE — Progress Notes (Signed)
06/06/12 0645 Monitor tech notified the RN that the patient's heart rate had been steadily increasing. The patient was using the bathroom and washing herself at that time.

## 2012-06-06 NOTE — Progress Notes (Signed)
[place] for coumadin Indication: DVT  Allergies  Allergen Reactions  . Codeine   . Sulfa Antibiotics     Patient Measurements: Height: 5\' 3"  (160 cm) Weight: 187 lb 1.6 oz (84.868 kg) IBW/kg (Calculated) : 52.4    Vital Signs: Temp: 97.4 F (36.3 C) (06/29 0525) Temp src: Oral (06/29 0525) BP: 117/75 mmHg (06/29 0525) Pulse Rate: 86  (06/29 0525)  Labs:  Flo Shanks 06/06/12 0521 06/05/12 0443 06/04/12 0524  HGB -- 11.6* --  HCT -- 35.4* --  PLT -- 190 --  APTT -- -- --  LABPROT 28.7* 30.9* 24.1*  INR 2.65* 2.91* 2.12*  HEPARINUNFRC -- -- --  CREATININE 1.21* 1.11* --  CKTOTAL -- -- --  CKMB -- -- --  TROPONINI -- -- --    Estimated Creatinine Clearance: 41.5 ml/min (by C-G formula based on Cr of 1.21).   Assessment:  78 yo F with new LLE DVT on day 5/5 minimum coumadin/lovenox overlap.  Lovenox d/c this am by MD.   Patient has a history of A.Fib but was not on any anticoagulation prior to admission.  Per Dr. Marlou Sa the patient refused treatment with anticoagulation.     INR therapeutic, down slightly. (7.5, 7.5, 0, 0mg  6/25-28)  CBC ok. Noted small abdominal hematoma.  Goal of Therapy:  INR 2-3   Plan:   Coumadin 5mg  tonight  Daily PT/INR  Coumadin education completed 6/26  Marcell Anger PharmD  [phone] 06/06/2012 10:53 AM

## 2012-06-06 NOTE — Progress Notes (Signed)
Patient ID: Jasmine [name], female   DOB: 1934-10-11, 78 y.o.   MRN: [id number] Chart reviewed. Continues in Afib. Run of NSVT noted. Labs stable. Nothing new to add. Call if questions. Cell [phone].

## 2012-06-06 NOTE — Progress Notes (Signed)
Subjective:  Patient denies any chest pain or shortness of breath. Complains of occasional palpitations denies any lightheadedness or syncopal episode  Objective:  Vital Signs in the last 24 hours: Temp:  [97.4 F (36.3 C)-97.5 F (36.4 C)] 97.4 F (36.3 C) (06/29 0525) Pulse Rate:  [78-88] 86  (06/29 0525) Resp:  [18-20] 18  (06/29 0525) BP: (110-127)/(75-84) 117/75 mmHg (06/29 0525) SpO2:  [97 %-99 %] 98 % (06/29 0525) Weight:  [84.868 kg (187 lb 1.6 oz)] 84.868 kg (187 lb 1.6 oz) (06/29 0525)  Intake/Output from previous day: 06/28 0701 - 06/29 0700 In: 723 [P.O.:720; I.V.:3] Out: 300 [Urine:300] Intake/Output from this shift: Total I/O In: 240 [P.O.:240] Out: 600 [Urine:600]  Physical Exam: Neck: no adenopathy, no carotid bruit, no JVD and supple, symmetrical, trachea midline Lungs: clear to auscultation bilaterally Heart: irregularly irregular rhythm, S1, S2 normal and 2/6 systolic murmur noted no S3 gallop Abdomen: Soft nontender moderate ecchymosis noted with very small hematoma Extremities: extremities normal, atraumatic, no cyanosis or edema  Lab Results:  Basename 06/05/12 0443  WBC 5.9  HGB 11.6*  PLT 190    Basename 06/06/12 0521 06/05/12 0443  NA 134* 136  K 4.0 3.7  CL 102 103  CO2 23 23  GLUCOSE 92 91  BUN 27* 28*  CREATININE 1.21* 1.11*   No results found for this basename: TROPONINI:2,CK,MB:2 in the last 72 hours Hepatic Function Panel No results found for this basename: PROT,ALBUMIN,AST,ALT,ALKPHOS,BILITOT,BILIDIR,IBILI in the last 72 hours No results found for this basename: CHOL in the last 72 hours No results found for this basename: PROTIME in the last 72 hours  Imaging: Imaging results have been reviewed and No results found.  Cardiac Studies:  Assessment/Plan:  DVT left leg Hypertension Chronic atrial fibrillation Status post nonsustained VT asymptomatic Decompensated diastolic heart failure Valvular heart disease Morbid  obesity Mild renal insufficiency stage II Very small hematoma abdominal wall Plan DC Lovenox Continue Coumadin per pharmacy protocol Check labs in a.m.   LOS: 4 days    Jasmine [name] N 06/06/2012, 10:29 AM

## 2012-06-07 LAB — BASIC METABOLIC PANEL
BUN: 29 mg/dL — ABNORMAL HIGH (ref 6–23)
CO2: 22 mEq/L (ref 19–32)
Chloride: 101 mEq/L (ref 96–112)
Creatinine, Ser: 1.23 mg/dL — ABNORMAL HIGH (ref 0.50–1.10)
Potassium: 4.4 mEq/L (ref 3.5–5.1)

## 2012-06-07 LAB — CBC
HCT: 34.5 % — ABNORMAL LOW (ref 36.0–46.0)
Hemoglobin: 11.6 g/dL — ABNORMAL LOW (ref 12.0–15.0)
MCV: 91 fL (ref 78.0–100.0)
RDW: 15.7 % — ABNORMAL HIGH (ref 11.5–15.5)
WBC: 8.1 10*3/uL (ref 4.0–10.5)

## 2012-06-07 LAB — PROTIME-INR: INR: 2.77 — ABNORMAL HIGH (ref 0.00–1.49)

## 2012-06-07 MED ORDER — ATENOLOL 100 MG PO TABS
100.0000 mg | ORAL_TABLET | Freq: Every day | ORAL | Status: DC
  Administered 2012-06-07 – 2012-06-11 (×5): 100 mg via ORAL
  Filled 2012-06-07: qty 1

## 2012-06-07 MED ORDER — WARFARIN SODIUM 5 MG PO TABS
5.0000 mg | ORAL_TABLET | Freq: Once | ORAL | Status: AC
  Administered 2012-06-07: 5 mg via ORAL
  Filled 2012-06-07: qty 1

## 2012-06-07 NOTE — Progress Notes (Signed)
Subjective:  Patient complains of abdominal wall swelling associated with mild pain. Also complains of exertional dyspnea with minimal exertion. Denies any chest pain palpitations  Objective:  Vital Signs in the last 24 hours: Temp:  [97.8 F (36.6 C)-98.1 F (36.7 C)] 97.8 F (36.6 C) (06/30 0552) Pulse Rate:  [66-85] 85  (06/30 0552) Resp:  [18-20] 20  (06/30 0552) BP: (113-136)/(78-86) 136/86 mmHg (06/30 0552) SpO2:  [95 %-100 %] 95 % (06/30 0552) Weight:  [85.1 kg (187 lb 9.8 oz)] 85.1 kg (187 lb 9.8 oz) (06/30 0552)  Intake/Output from previous day: 06/29 0701 - 06/30 0700 In: 720 [P.O.:720] Out: 1700 [Urine:1700] Intake/Output from this shift: Total I/O In: 240 [P.O.:240] Out: 1725 [Urine:1725]  Physical Exam: Neck: no adenopathy, no carotid bruit, no JVD and supple, symmetrical, trachea midline Lungs: clear to auscultation bilaterally Heart: irregularly irregular rhythm, S1, S2 normal and  2/6 systolic murmur noted Abdomen: soft, non-tender; bowel sounds normal; no masses,  no organomegaly and Small hematoma with large area of ecchymosis noted Extremities: extremities normal, atraumatic, no cyanosis or edema  Lab Results:  Basename 06/07/12 0522 06/05/12 0443  WBC 8.1 5.9  HGB 11.6* 11.6*  PLT 193 190    Basename 06/07/12 0522 06/06/12 0521  NA 134* 134*  K 4.4 4.0  CL 101 102  CO2 22 23  GLUCOSE 104* 92  BUN 29* 27*  CREATININE 1.23* 1.21*   No results found for this basename: TROPONINI:2,CK,MB:2 in the last 72 hours Hepatic Function Panel No results found for this basename: PROT,ALBUMIN,AST,ALT,ALKPHOS,BILITOT,BILIDIR,IBILI in the last 72 hours No results found for this basename: CHOL in the last 72 hours No results found for this basename: PROTIME in the last 72 hours  Imaging: Imaging results have been reviewed and No results found.  Cardiac Studies:  Assessment/Plan:  DVT left leg  Hypertension  Chronic atrial fibrillation  Status post  nonsustained VT asymptomatic  Decompensated diastolic heart failure  Valvular heart disease  Morbid obesity  Mild renal insufficiency stage II  Very small hematoma abdominal wall   Plan Continue present management Increase ambulation Warm compressors locally  LOS: 5 days    Jasmine [name] N 06/07/2012, 1:55 PM

## 2012-06-07 NOTE — Progress Notes (Signed)
Patient ID: Jasmine [name], female   DOB: 01/24/1934, 78 y.o.   MRN: [id number] Chart reviewed. Nothing new to add. Call if questions.

## 2012-06-07 NOTE — Progress Notes (Signed)
[place] for coumadin Indication: DVT  Allergies  Allergen Reactions  . Codeine   . Sulfa Antibiotics     Patient Measurements: Height: 5\' 3"  (160 cm) Weight: 187 lb 9.8 oz (85.1 kg) IBW/kg (Calculated) : 52.4    Vital Signs: Temp: 97.8 F (36.6 C) (06/30 0552) Temp src: Oral (06/30 0552) BP: 136/86 mmHg (06/30 0552) Pulse Rate: 85  (06/30 0552)  Labs:  Basename 06/07/12 0522 06/06/12 0521 06/05/12 0443  HGB 11.6* -- 11.6*  HCT 34.5* -- 35.4*  PLT 193 -- 190  APTT -- -- --  LABPROT 29.7* 28.7* 30.9*  INR 2.77* 2.65* 2.91*  HEPARINUNFRC -- -- --  CREATININE 1.23* 1.21* 1.11*  CKTOTAL -- -- --  CKMB -- -- --  TROPONINI -- -- --    Estimated Creatinine Clearance: 40.9 ml/min (by C-G formula based on Cr of 1.23).   Assessment:  78 yo F with new LLE DVT completed minimum 5 day overlap w/lovenox 6/29. Continues on Coumadin Tx.  Patient has a history of A.Fib but was not on any anticoagulation prior to admission.  Per Dr. Marlou Sa the patient refused treatment with anticoagulation.     INR therapeutic (7.5, 7.5, 0, 0, 5mg  mg 6/25-29)  CBC ok. Per MD notes, small abdominal hematoma. No overt bleeding reported  Goal of Therapy:  INR 2-3   Plan:   Coumadin 5mg  tonight  Daily PT/INR  Coumadin education completed 6/26  Marcell Anger PharmD  [phone] 06/07/2012 9:13 AM

## 2012-06-08 LAB — BASIC METABOLIC PANEL
BUN: 30 mg/dL — ABNORMAL HIGH (ref 6–23)
CO2: 26 mEq/L (ref 19–32)
Chloride: 100 mEq/L (ref 96–112)
Creatinine, Ser: 1.28 mg/dL — ABNORMAL HIGH (ref 0.50–1.10)
Glucose, Bld: 100 mg/dL — ABNORMAL HIGH (ref 70–99)
Potassium: 3.9 mEq/L (ref 3.5–5.1)

## 2012-06-08 LAB — CBC
HCT: 32.7 % — ABNORMAL LOW (ref 36.0–46.0)
Hemoglobin: 10.6 g/dL — ABNORMAL LOW (ref 12.0–15.0)
MCH: 29.7 pg (ref 26.0–34.0)
MCHC: 32.4 g/dL (ref 30.0–36.0)
MCV: 91.6 fL (ref 78.0–100.0)

## 2012-06-08 LAB — PROTIME-INR: Prothrombin Time: 33.7 seconds — ABNORMAL HIGH (ref 11.6–15.2)

## 2012-06-08 MED ORDER — POLYETHYLENE GLYCOL 3350 17 G PO PACK
17.0000 g | PACK | Freq: Every day | ORAL | Status: DC
  Administered 2012-06-08 – 2012-06-11 (×4): 17 g via ORAL
  Filled 2012-06-08 (×4): qty 1

## 2012-06-08 NOTE — Progress Notes (Signed)
[place] for coumadin Indication: DVT  Allergies  Allergen Reactions  . Codeine   . Sulfa Antibiotics     Patient Measurements: Height: 5\' 3"  (160 cm) Weight: 188 lb 0.8 oz (85.3 kg) IBW/kg (Calculated) : 52.4    Vital Signs: Temp: 97.2 F (36.2 C) (07/01 0534) Temp src: Oral (07/01 0534) BP: 111/75 mmHg (07/01 0534) Pulse Rate: 81  (07/01 0534)  Labs:  Basename 06/08/12 0410 06/07/12 0522 06/06/12 0521  HGB 10.6* 11.6* --  HCT 32.7* 34.5* --  PLT 192 193 --  APTT -- -- --  LABPROT 33.7* 29.7* 28.7*  INR 3.26* 2.77* 2.65*  HEPARINUNFRC -- -- --  CREATININE 1.28* 1.23* 1.21*  CKTOTAL -- -- --  CKMB -- -- --  TROPONINI -- -- --    Estimated Creatinine Clearance: 39.3 ml/min (by C-G formula based on Cr of 1.28).   Assessment:  78 yo F with new LLE DVT completed minimum 5 day overlap w/lovenox 6/29. Continues on Coumadin Tx.  Patient has a history of A.Fib but was not on any anticoagulation prior to admission.  Per Dr. Marlou Sa the patient refused treatment with anticoagulation.     INR supratherapeutic today following 5 mg doses.  Will hold today and restart at lower dose.  CBC ok. Per MD notes, small abdominal hematoma. No overt bleeding reported.  Goal of Therapy:  INR 2-3   Plan:   No warfarin dose tonight.  Daily PT/INR  Coumadin education completed 6/26  Hershal Coria, PharmD, BCPS Pager: [phone] 06/08/2012 7:41 AM

## 2012-06-08 NOTE — Progress Notes (Signed)
Subjective:  Patient complaining of ongoing bruising from Lovenox. No chest pain or dyspnea. Feeling better otherwise. No recent bowel movement.patient ambulated today as well.   Allergies  Allergen Reactions  . Codeine   . Sulfa Antibiotics    Current Facility-Administered Medications  Medication Dose Route Frequency Provider Last Rate Last Dose  . 0.9 %  sodium chloride infusion  250 mL Intravenous PRN [name], MD      . aspirin EC tablet 81 mg  81 mg Oral Daily [name], MD   81 mg at 06/08/12 0943  . atenolol (TENORMIN) tablet 100 mg  100 mg Oral Daily [name], MD   100 mg at 06/08/12 0946  . atenolol (TENORMIN) tablet 25 mg  25 mg Oral QAC supper [name], MD   25 mg at 06/08/12 1731  . docusate sodium (COLACE) capsule 100 mg  100 mg Oral BID [name], MD   100 mg at 06/08/12 0943  . furosemide (LASIX) tablet 20 mg  20 mg Oral Daily Belva Crome III, MD   20 mg at 06/08/12 0943  . irbesartan (AVAPRO) tablet 300 mg  300 mg Oral Daily [name], MD   300 mg at 06/08/12 0943  . loratadine (CLARITIN) tablet 10 mg  10 mg Oral Daily [name], MD   10 mg at 06/08/12 0943  . meclizine (ANTIVERT) tablet 12.5 mg  12.5 mg Oral TID PRN [name], MD      . ondansetron [facility]) tablet 4 mg  4 mg Oral Q6H PRN [name], MD       Or  . ondansetron [facility]) injection 4 mg  4 mg Intravenous Q6H PRN [name], MD      . pantoprazole (PROTONIX) EC tablet 40 mg  40 mg Oral Q1200 [name], MD   40 mg at 06/08/12 1230  . sodium chloride (OCEAN) 0.65 % nasal spray 1 spray  1 spray Each Nare PRN [name], MD      . sodium chloride 0.9 % injection 3 mL  3 mL Intravenous Q12H [name], MD   3 mL at 06/08/12 0944  . sodium chloride 0.9 % injection 3 mL  3 mL Intravenous PRN [name], MD      . spironolactone (ALDACTONE) tablet 25 mg  25 mg Oral Daily Belva Crome III, MD   25 mg at 06/08/12 0943  . Warfarin - Pharmacist Dosing Inpatient   Does not apply Redland Borgerding, PHARMD        Objective: Blood pressure 127/85, pulse 85, temperature 97.2 F (36.2 C), temperature source Oral, resp. rate 20, height 5\' 3"  (1.6 m), weight 188 lb 0.8 oz (85.3 kg), SpO2 100.00%.  WDWN black female in no acute dsitsress. HEENT:no sinus tenderness. No mucosal hemorrhages. NECK:no posterior cervical nodes.  LUNGS: clear to ausculattion. [id number] S1, S2 without S3. 1/6 systolic ejection murmur heard loudest in second left ICS. [id number] sounds are present. Large hematoma left flank and right flank. Small hematoma on the right upper leg. [id number] Homans. No edema. NEURO:intact.  Lab results: Results for orders placed during the hospital encounter of 06/02/12 (from the past 48 hour(s))  PROTIME-INR     Status: Abnormal   Collection Time   06/07/12  5:22 AM      Component Value Range Comment   Prothrombin Time 29.7 (*) 11.6 - 15.2 seconds  INR 2.77 (*) 0.00 - 99991111   BASIC METABOLIC PANEL     Status: Abnormal   Collection Time   06/07/12  5:22 AM      Component Value Range Comment   Sodium 134 (*) 135 - 145 mEq/L    Potassium 4.4  3.5 - 5.1 mEq/L    Chloride 101  96 - 112 mEq/L    CO2 22  19 - 32 mEq/L    Glucose, Bld 104 (*) 70 - 99 mg/dL    BUN 29 (*) 6 - 23 mg/dL    Creatinine, Ser 1.23 (*) 0.50 - 1.10 mg/dL    Calcium 9.4  8.4 - 10.5 mg/dL    GFR calc non Af Amer 42 (*) >90 mL/min    GFR calc Af Amer 48 (*) >90 mL/min   CBC     Status: Abnormal   Collection Time   06/07/12  5:22 AM      Component Value Range Comment   WBC 8.1  4.0 - 10.5 K/uL    RBC 3.79 (*) 3.87 - 5.11 MIL/uL    Hemoglobin 11.6 (*) 12.0 - 15.0 g/dL    HCT 34.5 (*) 36.0 - 46.0 %    MCV 91.0  78.0 - 100.0 fL    MCH 30.6  26.0 - 34.0 pg    MCHC 33.6  30.0 - 36.0 g/dL    RDW 15.7 (*) 11.5 - 15.5 %    Platelets 193  150 - 400 K/uL   CBC     Status: Abnormal   Collection Time   06/08/12  4:10 AM      Component Value Range Comment   WBC 6.9  4.0 - 10.5 K/uL    RBC  3.57 (*) 3.87 - 5.11 MIL/uL    Hemoglobin 10.6 (*) 12.0 - 15.0 g/dL    HCT 32.7 (*) 36.0 - 46.0 %    MCV 91.6  78.0 - 100.0 fL    MCH 29.7  26.0 - 34.0 pg    MCHC 32.4  30.0 - 36.0 g/dL    RDW 15.5  11.5 - 15.5 %    Platelets 192  150 - 400 K/uL   PROTIME-INR     Status: Abnormal   Collection Time   06/08/12  4:10 AM      Component Value Range Comment   Prothrombin Time 33.7 (*) 11.6 - 15.2 seconds    INR 3.26 (*) 0.00 - 99991111   BASIC METABOLIC PANEL     Status: Abnormal   Collection Time   06/08/12  4:10 AM      Component Value Range Comment   Sodium 135  135 - 145 mEq/L    Potassium 3.9  3.5 - 5.1 mEq/L    Chloride 100  96 - 112 mEq/L    CO2 26  19 - 32 mEq/L    Glucose, Bld 100 (*) 70 - 99 mg/dL    BUN 30 (*) 6 - 23 mg/dL    Creatinine, Ser 1.28 (*) 0.50 - 1.10 mg/dL    Calcium 9.3  8.4 - 10.5 mg/dL    GFR calc non Af Amer 40 (*) >90 mL/min    GFR calc Af Amer 46 (*) >90 mL/min     Studies/Results: No results found.  Patient Active Problem List  Diagnosis  . Atrial fibrillation  . Hypertension    Impression: DVT of the left leg. Supratherapeutic INR. Patient without recent dosing of Coumadin. Excessive bruising/hematoma from Lovenox. This  has been discontinued. History of atrial fibrillation. Hypertension stable. Mild insulin resistance. Colonic dysfunction.   Plan: Resume Coumadin when appropriate. MiraLax daily for constipation. Continue monitoring for adverse reactions to Lovenox.   Marlou Sa, Rhett Mutschler 06/08/2012 9:06 PM

## 2012-06-09 LAB — BASIC METABOLIC PANEL
BUN: 35 mg/dL — ABNORMAL HIGH (ref 6–23)
CO2: 24 mEq/L (ref 19–32)
Chloride: 99 mEq/L (ref 96–112)
Glucose, Bld: 83 mg/dL (ref 70–99)
Potassium: 3.9 mEq/L (ref 3.5–5.1)
Sodium: 134 mEq/L — ABNORMAL LOW (ref 135–145)

## 2012-06-09 MED ORDER — ATENOLOL 50 MG PO TABS
50.0000 mg | ORAL_TABLET | Freq: Every day | ORAL | Status: DC
  Administered 2012-06-10: 50 mg via ORAL
  Filled 2012-06-09 (×2): qty 1

## 2012-06-09 MED ORDER — ATENOLOL 25 MG PO TABS
25.0000 mg | ORAL_TABLET | Freq: Once | ORAL | Status: AC
  Administered 2012-06-09: 25 mg via ORAL
  Filled 2012-06-09: qty 1

## 2012-06-09 NOTE — Progress Notes (Signed)
Subjective:  Patient is feeling better overall. She continues to have some abdominal tenderness from her hematomas. She notes area as a spread further but is no increased pain. Patient is been noted to have tachycardia when she ambulates. Her atenolol was increased this afternoon to compensate for this. She denies chest pains lightheadedness or dyspnea.   Allergies  Allergen Reactions  . Codeine   . Sulfa Antibiotics    Current Facility-Administered Medications  Medication Dose Route Frequency Provider Last Rate Last Dose  . 0.9 %  sodium chloride infusion  250 mL Intravenous PRN [name], MD      . aspirin EC tablet 81 mg  81 mg Oral Daily [name], MD   81 mg at 06/09/12 1005  . atenolol (TENORMIN) tablet 100 mg  100 mg Oral Daily [name], MD   100 mg at 06/09/12 1050  . atenolol (TENORMIN) tablet 25 mg  25 mg Oral Once [name], MD   25 mg at 06/09/12 1828  . atenolol (TENORMIN) tablet 50 mg  50 mg Oral QAC supper [name], MD      . docusate sodium (COLACE) capsule 100 mg  100 mg Oral BID [name], MD   100 mg at 06/09/12 1005  . furosemide (LASIX) tablet 20 mg  20 mg Oral Daily Belva Crome III, MD   20 mg at 06/09/12 1005  . irbesartan (AVAPRO) tablet 300 mg  300 mg Oral Daily [name], MD   300 mg at 06/09/12 1004  . loratadine (CLARITIN) tablet 10 mg  10 mg Oral Daily [name], MD   10 mg at 06/09/12 1004  . meclizine (ANTIVERT) tablet 12.5 mg  12.5 mg Oral TID PRN [name], MD      . ondansetron [facility]) tablet 4 mg  4 mg Oral Q6H PRN [name], MD       Or  . ondansetron [facility]) injection 4 mg  4 mg Intravenous Q6H PRN [name], MD      . pantoprazole (PROTONIX) EC tablet 40 mg  40 mg Oral Q1200 [name], MD   40 mg at 06/09/12 1203  . polyethylene glycol (MIRALAX / GLYCOLAX) packet 17 g  17 g Oral Daily [name], MD   17 g at 06/09/12 1000  . sodium chloride (OCEAN) 0.65 % nasal spray 1 spray  1 spray Each Nare PRN [name], MD      .  sodium chloride 0.9 % injection 3 mL  3 mL Intravenous Q12H [name], MD   3 mL at 06/09/12 1000  . sodium chloride 0.9 % injection 3 mL  3 mL Intravenous PRN [name], MD      . spironolactone (ALDACTONE) tablet 25 mg  25 mg Oral Daily Belva Crome III, MD   25 mg at 06/09/12 1050  . Warfarin - Pharmacist Dosing Inpatient   Does not apply [place], [place]: atenolol (TENORMIN) tablet 25 mg  25 mg Oral QAC supper [name], MD   25 mg at 06/09/12 1635    Objective: Blood pressure 106/70, pulse 80, temperature 97.4 F (36.3 C), temperature source Oral, resp. rate 16, height 5\' 3"  (1.6 m), weight 188 lb 4.4 oz (85.4 kg), SpO2 98.00%.  Well-developed well-nourished overweight black female in no acute distress. HEENT: No sinus tenderness. NECK: No  posterior cervical nodes. LUNGS: Clear to auscultation. CV: Normal S1, S2 with irregular rhythm. No rub. ABD: Persistent bilateral flank tenderness associated with hematomas. No obvious new areas of bleeding. MSK: Negative Homans. No edema. NEURO: Intact.  Lab results: Results for orders placed during the hospital encounter of 06/02/12 (from the past 48 hour(s))  CBC     Status: Abnormal   Collection Time   06/08/12  4:10 AM      Component Value Range Comment   WBC 6.9  4.0 - 10.5 K/uL    RBC 3.57 (*) 3.87 - 5.11 MIL/uL    Hemoglobin 10.6 (*) 12.0 - 15.0 g/dL    HCT 32.7 (*) 36.0 - 46.0 %    MCV 91.6  78.0 - 100.0 fL    MCH 29.7  26.0 - 34.0 pg    MCHC 32.4  30.0 - 36.0 g/dL    RDW 15.5  11.5 - 15.5 %    Platelets 192  150 - 400 K/uL   PROTIME-INR     Status: Abnormal   Collection Time   06/08/12  4:10 AM      Component Value Range Comment   Prothrombin Time 33.7 (*) 11.6 - 15.2 seconds    INR 3.26 (*) 0.00 - 99991111   BASIC METABOLIC PANEL     Status: Abnormal   Collection Time   06/08/12  4:10 AM      Component Value Range Comment   Sodium 135  135 - 145 mEq/L    Potassium 3.9  3.5 - 5.1 mEq/L     Chloride 100  96 - 112 mEq/L    CO2 26  19 - 32 mEq/L    Glucose, Bld 100 (*) 70 - 99 mg/dL    BUN 30 (*) 6 - 23 mg/dL    Creatinine, Ser 1.28 (*) 0.50 - 1.10 mg/dL    Calcium 9.3  8.4 - 10.5 mg/dL    GFR calc non Af Amer 40 (*) >90 mL/min    GFR calc Af Amer 46 (*) >90 mL/min   PROTIME-INR     Status: Abnormal   Collection Time   06/09/12  4:10 AM      Component Value Range Comment   Prothrombin Time 37.0 (*) 11.6 - 15.2 seconds    INR 3.67 (*) 0.00 - 99991111   BASIC METABOLIC PANEL     Status: Abnormal   Collection Time   06/09/12  4:10 AM      Component Value Range Comment   Sodium 134 (*) 135 - 145 mEq/L    Potassium 3.9  3.5 - 5.1 mEq/L    Chloride 99  96 - 112 mEq/L    CO2 24  19 - 32 mEq/L    Glucose, Bld 83  70 - 99 mg/dL    BUN 35 (*) 6 - 23 mg/dL    Creatinine, Ser 1.27 (*) 0.50 - 1.10 mg/dL    Calcium 9.2  8.4 - 10.5 mg/dL    GFR calc non Af Amer 40 (*) >90 mL/min    GFR calc Af Amer 47 (*) >90 mL/min     Studies/Results: No results found.  Patient Active Problem List  Diagnosis  . Atrial fibrillation  . Hypertension    Impression: Left leg DVT presently improving. Supranormal INR. This continues to rise despite no further Coumadin. Episodic SVT. This has been occurring with increasing ambulation. Her atenolol was increased. Atrial fibrillation. Previously rate controlled. Adverse effect of Lovenox. This has been  held. Mild insulin resistance.    Plan: Continue to hold Coumadin. Followup INR in a.m. If continues to rise she will need vitamin K. Pharmacy is following. Increase her atenolol to 100 mg in a.m. and 50 mg in p.m. Increase ambulation with observation as tolerated.   Marlou Sa, Trayson Stitely 06/09/2012 7:49 PM

## 2012-06-09 NOTE — Progress Notes (Signed)
MD notified of patient having 11 beats of SVT. Patient was asymptomatic and walking in room. VS stable. MD plans to give 25 mg of atenolol this pm. Will continue to monitor patient. Setzer, Marchelle Folks

## 2012-06-09 NOTE — Progress Notes (Signed)
[place] for coumadin Indication: DVT  Allergies  Allergen Reactions  . Codeine   . Sulfa Antibiotics     Patient Measurements: Height: 5\' 3"  (160 cm) Weight: 188 lb 4.4 oz (85.4 kg) IBW/kg (Calculated) : 52.4    Vital Signs: Temp: 97.4 F (36.3 C) (07/02 0608) Temp src: Oral (07/02 0608) BP: 116/76 mmHg (07/02 0608) Pulse Rate: 80  (07/02 0608)  Labs:  Basename 06/09/12 0410 06/08/12 0410 06/07/12 0522  HGB -- 10.6* 11.6*  HCT -- 32.7* 34.5*  PLT -- 192 193  APTT -- -- --  LABPROT 37.0* 33.7* 29.7*  INR 3.67* 3.26* 2.77*  HEPARINUNFRC -- -- --  CREATININE 1.27* 1.28* 1.23*  CKTOTAL -- -- --  CKMB -- -- --  TROPONINI -- -- --    Estimated Creatinine Clearance: 39.6 ml/min (by C-G formula based on Cr of 1.27).   Assessment:  78 yo F with new LLE DVT completed minimum 5 day overlap w/lovenox 6/29. Continues on Coumadin.  Patient has a history of A.Fib but was not on any anticoagulation prior to admission.  Per Dr. Marlou Sa the patient refused treatment with anticoagulation.     INR remains supratherapeutic today and has increased following 5 mg doses.  Will hold again today and restart at lower dose.  CBC ok. Per MD notes, small abdominal hematoma. No overt bleeding reported.  Goal of Therapy:  INR 2-3   Plan:   No warfarin dose tonight.  Daily PT/INR  Coumadin education completed 6/26  Hershal Coria, PharmD, BCPS Pager: [phone] 06/09/2012 9:50 AM

## 2012-06-09 NOTE — Plan of Care (Signed)
Problem: Phase I Progression Outcomes Goal: Other Phase I Outcomes/Goals Outcome: Completed/Met Date Met:  06/09/12 Heart Failure Zone Tool book and magnet given to patient, education  complete.

## 2012-06-09 NOTE — Social Work (Signed)
Clinical Social Work Department BRIEF PSYCHOSOCIAL ASSESSMENT 06/09/2012  Patient:  Jasmine [name], Jasmine [name]     Account Number:  [id number]     Admit date:  06/02/2012  Clinical Social Worker:  Lenord Fellers  Date/Time:  06/09/2012 02:00 PM  Referred by:  Physician  Date Referred:  06/09/2012 Referred for  Other - See comment   Other Referral:   d/c planning   Interview type:  Patient Other interview type:   twin sister was in the room with her    PSYCHOSOCIAL DATA Living Status:  FAMILY Admitted from facility:   Level of care:   Primary support name:  Adiya Livigni Primary support relationship to patient:  SIBLING Degree of support available:   great    CURRENT CONCERNS Current Concerns  None Noted   Other Concerns:    SOCIAL WORK ASSESSMENT / PLAN Patient was with twin sister during assessment.  Patient and twin identified that patient as been here in order to adjust to new coumadin medication due to DVT.  Patient and twin also identified that they have some support from extended family but mostly support and take care of each other.   Assessment/plan status:  No Further Intervention Required Other assessment/ plan:   Information/referral to community resources:    PATIENT'S/FAMILY'S RESPONSE TO PLAN OF CARE: Patient and sister have necessary resources and preparing for discharge when medically able.

## 2012-06-10 LAB — BASIC METABOLIC PANEL
CO2: 23 mEq/L (ref 19–32)
Calcium: 9.4 mg/dL (ref 8.4–10.5)
Glucose, Bld: 94 mg/dL (ref 70–99)
Potassium: 4 mEq/L (ref 3.5–5.1)
Sodium: 131 mEq/L — ABNORMAL LOW (ref 135–145)

## 2012-06-10 LAB — PROTIME-INR: INR: 3.1 — ABNORMAL HIGH (ref 0.00–1.49)

## 2012-06-10 NOTE — Clinical Documentation Improvement (Signed)
CKD DOCUMENTATION CLARIFICATION QUERY   THIS DOCUMENT IS NOT A PERMANENT PART OF THE MEDICAL RECORD  TO RESPOND TO THE THIS QUERY, FOLLOW THE INSTRUCTIONS BELOW:  1. If needed, update documentation for the patient's encounter via the notes activity.  2. Access this query again and click edit on the In [employer].  3. After updating, or not, click F2 to complete all highlighted (required) fields concerning your review. Select "additional documentation in the medical record" OR "no additional documentation provided".  4. Click Sign note button.  5. The deficiency will fall out of your In Basket *Please let us know if you are not able to complete this workflow by phone or e-mail (listed below).  Please update your documentation within the medical record to reflect your response to this query.                                                                                        06/10/12   Dear Dr. Dean:/Associates,  In a better effort to capture your patient's severity of illness, reflect appropriate length of stay and utilization of resources, a review of the patient medical record has revealed the following indicators.    Based on your clinical judgment, please clarify and document in a progress note and/or discharge summary the clinical condition associated with the following supporting information:  In responding to this query please exercise your independent judgment.  The fact that a query is asked, does not imply that any particular answer is desired or expected.  Pt admitted with DVT.  According to pn 06/07/12 pt with "Mild renal insufficiency stage II" in setting of [id number]   Please clarify if the "Mild renal insufficiency stage II," can be further specified as one of the diagnoses listed below and document in pn or d/c summary.   Possible Clinical Conditions?   _______CKD Stage I -  GFR > OR = 90 _______CKD Stage II - GFR 60-80 _______CKD Stage III - GFR  30-59 _______CKD Stage IV - GFR 15-29 _______CKD Stage V - GFR < 15 _______ESRD (End Stage Renal Disease) _______Other condition_____________ _______Cannot Clinically determine   Supporting Information:  Risk Factors:  DVT, Mild renal insufficiency stage II, V Tach, AFIB, Diastolic CHF, HTN, morbid obesity, valvular heart disease.   Signs & Symptoms:   Diagnostics:  Lab:  Component BUN Creat  Latest Ref Rng 6 - 23 mg/dL 0.50 - 1.10 mg/dL  06/05/2012 28 (H) 1.11 (H)  06/06/2012 27 (H) 1.21 (H)  06/07/2012 29 (H) 1.23 (H)  06/08/2012 30 (H) 1.28 (H)  06/09/2012 35 (H) 1.27 (H)  06/10/2012 35 (H) 1.22 (H)   Component GFR calc Af Amer  Latest Ref Rng >90 mL/min  06/02/2012 50 (L)  06/05/2012 55 (L)  06/06/2012 49 (L)  06/07/2012 48 (L)  06/08/2012 46 (L)  06/09/2012 47 (L)  06/10/2012 49 (L)    Treatment: Monitoring   You may use possible, probable, or suspect with inpatient documentation. possible, probable, suspected diagnoses MUST be documented at the time of discharge  Reviewed:  no additional documentation provided ljh   Thank You,  Heloise Beecham  RN, BSN,  CCDS Clinical Documentation Specialist [facility] Dept Pager: [phone] / E-mail: [email or web address]

## 2012-06-10 NOTE — Progress Notes (Signed)
Patient has a 2.56 sec pause at 01:06 am.  Patient was asymptomatic and denies any chest pain or SOB.  MD was notified, no new orders at this time.  Will continue to monitor patient.  Owens Shark, Azelea Seguin Cherie

## 2012-06-10 NOTE — Progress Notes (Addendum)
[place] for coumadin Indication: DVT  Allergies  Allergen Reactions  . Codeine   . Sulfa Antibiotics     Patient Measurements: Height: 5\' 3"  (160 cm) Weight: 188 lb 11.4 oz (85.6 kg) (standup scale) IBW/kg (Calculated) : 52.4    Vital Signs: Temp: 97.4 F (36.3 C) (07/03 0515) Temp src: Oral (07/03 0515) BP: 138/77 mmHg (07/03 0515) Pulse Rate: 81  (07/03 0515)  Labs:  Basename 06/10/12 0430 06/09/12 0410 06/08/12 0410  HGB -- -- 10.6*  HCT -- -- 32.7*  PLT -- -- 192  APTT -- -- --  LABPROT 32.4* 37.0* 33.7*  INR 3.10* 3.67* 3.26*  HEPARINUNFRC -- -- --  CREATININE 1.22* 1.27* 1.28*  CKTOTAL -- -- --  CKMB -- -- --  TROPONINI -- -- --    Estimated Creatinine Clearance: 41.3 ml/min (by C-G formula based on Cr of 1.22).   Assessment:  78 yo F with new LLE DVT completed minimum 5 day overlap w/lovenox 6/29. Continues on Coumadin.  Patient has a history of A.Fib but was not on any anticoagulation prior to admission.  Per Dr. Marlou Sa the patient refused treatment with anticoagulation.     INR remains supratherapeutic today but trending down following 5 mg doses.  Will hold x 1 more today and restart at lower dose tomorrow.  CBC ok. Per MD notes, small abdominal hematoma. No overt bleeding reported.  Goal of Therapy:  INR 2-3   Plan:   No warfarin dose tonight.  Restart at low dose tomorrow.  Daily PT/INR  Coumadin education completed 6/26  Hershal Coria, PharmD, BCPS Pager: [phone] 06/10/2012 8:58 AM   ADDENDUM: 06/10/2012 12:38 PM Spoke the Juliann Pulse, case manager, regarding anticoagulation plan for possible discharge.  Patient has completed the minimum overlap of warfarin/lovenox for treatment of her new VTE.  Based on her responses to warfarin doses inpatient, would recommend that patient do not receive any doses of warfarin today and be discharged on Warfarin 2.5 mg po daily with INR check in 1-2 days.  Please  page me or call down to pharmacy with any questions. Hershal Coria, PharmD, BCPS Pager: [phone] 06/10/2012 12:41 PM

## 2012-06-10 NOTE — Progress Notes (Signed)
Subjective:  Patient continues to complain of lower abdominal pain from her hematomas from her injections. She however has been ambulating in the room and down the hallway. His been no further tachycardia noted. She had a 2.5 second pause on telemetry yesterday evening. Discussed plans for patient to be monitored as an outpatient. She and her twin sister would prefer coming to the office to have her PT and INR checked.   Allergies  Allergen Reactions  . Codeine   . Sulfa Antibiotics    Current Facility-Administered Medications  Medication Dose Route Frequency Provider Last Rate Last Dose  . 0.9 %  sodium chloride infusion  250 mL Intravenous PRN [name], MD      . aspirin EC tablet 81 mg  81 mg Oral Daily [name], MD   81 mg at 06/10/12 1030  . atenolol (TENORMIN) tablet 100 mg  100 mg Oral Daily [name], MD   100 mg at 06/10/12 1029  . atenolol (TENORMIN) tablet 50 mg  50 mg Oral QAC supper [name], MD   50 mg at 06/10/12 1739  . docusate sodium (COLACE) capsule 100 mg  100 mg Oral BID [name], MD   100 mg at 06/10/12 1029  . furosemide (LASIX) tablet 20 mg  20 mg Oral Daily Belva Crome III, MD   20 mg at 06/10/12 1029  . irbesartan (AVAPRO) tablet 300 mg  300 mg Oral Daily [name], MD   300 mg at 06/10/12 1029  . loratadine (CLARITIN) tablet 10 mg  10 mg Oral Daily [name], MD   10 mg at 06/10/12 1029  . meclizine (ANTIVERT) tablet 12.5 mg  12.5 mg Oral TID PRN [name], MD      . ondansetron [facility]) tablet 4 mg  4 mg Oral Q6H PRN [name], MD       Or  . ondansetron [facility]) injection 4 mg  4 mg Intravenous Q6H PRN [name], MD      . pantoprazole (PROTONIX) EC tablet 40 mg  40 mg Oral Q1200 [name], MD   40 mg at 06/10/12 1307  . polyethylene glycol (MIRALAX / GLYCOLAX) packet 17 g  17 g Oral Daily [name], MD   17 g at 06/10/12 1029  . sodium chloride (OCEAN) 0.65 % nasal spray 1 spray  1 spray Each Nare PRN [name], MD      . sodium  chloride 0.9 % injection 3 mL  3 mL Intravenous Q12H [name], MD   3 mL at 06/10/12 1033  . sodium chloride 0.9 % injection 3 mL  3 mL Intravenous PRN [name], MD      . spironolactone (ALDACTONE) tablet 25 mg  25 mg Oral Daily Belva Crome III, MD   25 mg at 06/10/12 1030  . Warfarin - Pharmacist Dosing Inpatient   Does not apply [place], [place]: atenolol (TENORMIN) tablet 25 mg  25 mg Oral QAC supper [name], MD   25 mg at 06/09/12 1635    Objective: Blood pressure 133/83, pulse 72, temperature 97.4 F (36.3 C), temperature source Oral, resp. rate 18, height 5\' 3"  (1.6 m), weight 188 lb 11.4 oz (85.6 kg), SpO2 100.00%.  Well-developed well-nourished black female in no acute distress. HEENT: No sinus tenderness. NECK: No posterior cervical nodes. No JVD  at 60. LUNGS: Clear to auscultation. No wheezes or rales. CV: Normal S1, S2 with irregular rhythm. No rub. ABD: Diffusely tender. Significant hematoma involving the right and left flanks of her abdomen bowel sounds are present.. MSK: Negative Homans bilaterally. (Improved.) Negative edema. NEURO: Intact.  Lab results: Results for orders placed during the hospital encounter of 06/02/12 (from the past 48 hour(s))  PROTIME-INR     Status: Abnormal   Collection Time   06/09/12  4:10 AM      Component Value Range Comment   Prothrombin Time 37.0 (*) 11.6 - 15.2 seconds    INR 3.67 (*) 0.00 - 99991111   BASIC METABOLIC PANEL     Status: Abnormal   Collection Time   06/09/12  4:10 AM      Component Value Range Comment   Sodium 134 (*) 135 - 145 mEq/L    Potassium 3.9  3.5 - 5.1 mEq/L    Chloride 99  96 - 112 mEq/L    CO2 24  19 - 32 mEq/L    Glucose, Bld 83  70 - 99 mg/dL    BUN 35 (*) 6 - 23 mg/dL    Creatinine, Ser 1.27 (*) 0.50 - 1.10 mg/dL    Calcium 9.2  8.4 - 10.5 mg/dL    GFR calc non Af Amer 40 (*) >90 mL/min    GFR calc Af Amer 47 (*) >90 mL/min   PROTIME-INR     Status: Abnormal    Collection Time   06/10/12  4:30 AM      Component Value Range Comment   Prothrombin Time 32.4 (*) 11.6 - 15.2 seconds    INR 3.10 (*) 0.00 - 99991111   BASIC METABOLIC PANEL     Status: Abnormal   Collection Time   06/10/12  4:30 AM      Component Value Range Comment   Sodium 131 (*) 135 - 145 mEq/L    Potassium 4.0  3.5 - 5.1 mEq/L    Chloride 98  96 - 112 mEq/L    CO2 23  19 - 32 mEq/L    Glucose, Bld 94  70 - 99 mg/dL    BUN 35 (*) 6 - 23 mg/dL    Creatinine, Ser 1.22 (*) 0.50 - 1.10 mg/dL    Calcium 9.4  8.4 - 10.5 mg/dL    GFR calc non Af Amer 42 (*) >90 mL/min    GFR calc Af Amer 49 (*) >90 mL/min     Studies/Results: No results found.  Patient Active Problem List  Diagnosis  . Atrial fibrillation  . Hypertension  . Deep venous thrombosis of lower extremity    Impression: Status post left lower extremity DVT, symptomatically improved. Supernormal INR. Patient extremely sensitive to Coumadin. Her INR is now beginning to trend downward for the first time. No further intervention will be warranted i.e. vitamin K. Pharmacy recommending a lower dose at discharge. Multiple hematoma secondary to Lovenox. Patient with exaggerated response. Atrial fibrillation with episodic SVT. Coumadin increased as of last night. No further episodes documented. History of congestive heart failure with elevated BNP.   Plan: Continue atenolol 100 mg in a.m. and 50 mg in p.m. Discharge patient home in a.m. on much reduced dose of Coumadin at 2.5 mg daily. Followup PT, INR as an outpatient in 2 days.   Marlou Sa, [place] Schorr 06/10/2012 6:29 PM

## 2012-06-11 LAB — BASIC METABOLIC PANEL
BUN: 34 mg/dL — ABNORMAL HIGH (ref 6–23)
Chloride: 99 mEq/L (ref 96–112)
GFR calc Af Amer: 48 mL/min — ABNORMAL LOW (ref >90)
GFR calc non Af Amer: 41 mL/min — ABNORMAL LOW (ref >90)
Glucose, Bld: 93 mg/dL (ref 70–99)
Potassium: 4.4 mEq/L (ref 3.5–5.1)
Sodium: 131 mEq/L — ABNORMAL LOW (ref 135–145)

## 2012-06-11 LAB — CBC
Hemoglobin: 9.6 g/dL — ABNORMAL LOW (ref 12.0–15.0)
MCH: 29.4 pg (ref 26.0–34.0)
MCV: 90.2 fL (ref 78.0–100.0)
Platelets: 177 10*3/uL (ref 150–400)
RBC: 3.27 MIL/uL — ABNORMAL LOW (ref 3.87–5.11)
WBC: 5.9 10*3/uL (ref 4.0–10.5)

## 2012-06-11 LAB — PROTIME-INR: Prothrombin Time: 27.5 seconds — ABNORMAL HIGH (ref 11.6–15.2)

## 2012-06-11 MED ORDER — SPIRONOLACTONE 25 MG PO TABS: 25.0000 mg | ORAL_TABLET | Freq: Every day | ORAL | Status: DC

## 2012-06-11 MED ORDER — FUROSEMIDE 20 MG PO TABS: 20.0000 mg | ORAL_TABLET | Freq: Every day | ORAL | Status: DC

## 2012-06-11 MED ORDER — WARFARIN SODIUM 2.5 MG PO TABS: 2.5000 mg | ORAL_TABLET | Freq: Every day | ORAL | Status: DC

## 2012-06-11 MED ORDER — WARFARIN SODIUM 2.5 MG PO TABS
2.5000 mg | ORAL_TABLET | Freq: Every day | ORAL | Status: DC
  Filled 2012-06-11: qty 1

## 2012-06-11 NOTE — Progress Notes (Signed)
[place] for Warfarin Indication: DVT  Allergies  Allergen Reactions  . Codeine   . Sulfa Antibiotics     Patient Measurements: Height: 5\' 3"  (160 cm) Weight: 187 lb 6.4 oz (85.004 kg) IBW/kg (Calculated) : 52.4    Vital Signs: Temp: 97.5 F (36.4 C) (07/04 0445) Temp src: Oral (07/04 0445) BP: 123/81 mmHg (07/04 0448) Pulse Rate: 92  (07/04 0448)  Labs:  Basename 06/11/12 0400 06/10/12 0430 06/09/12 0410  HGB 9.6* -- --  HCT 29.5* -- --  PLT 177 -- --  APTT -- -- --  LABPROT 27.5* 32.4* 37.0*  INR 2.51* 3.10* 3.67*  HEPARINUNFRC -- -- --  CREATININE 1.25* 1.22* 1.27*  CKTOTAL -- -- --  CKMB -- -- --  TROPONINI -- -- --    Estimated Creatinine Clearance: 40.1 ml/min (by C-G formula based on Cr of 1.25).  Inpatient warfarin doses 6/25 - 7/3:  7.5, 7.5, 0, 0, 5, 5, 0, 0, 0 mg.  Assessment:  78 yo F with new LLE DVT completed minimum 5 day overlap w/lovenox 6/29. Continues on Coumadin.  Patient has a history of A.Fib but was not on any anticoagulation prior to admission.  Per Dr. Marlou Sa the patient refused treatment with anticoagulation.     INR therapeutic after holding warfarin x 3 days. Warfarin maintenance dose requirements will likely be low.  Goal of Therapy:  INR 2-3   Plan:   Agree with plans for maintenance dosage of warfarin 2.5mg  daily.  Suggest INR recheck in 1-2 days.  Coumadin education completed 6/26.  Joycelyn Rua, PharmD, BCPS 06/11/2012 8:13 AM

## 2012-06-24 NOTE — Discharge Summary (Signed)
Physician Discharge Summary  Patient ID: Jasmine [name] MRN: [id number] DOB/AGE: 76-May-1935 76 y.o.  Admit date: 06/02/2012 Discharge date: 06/11/2012   Discharge Diagnoses:   Deep venous thrombosis of left leg Exaggerated warfarin response Hypertension Abdominal hematoma secondary to Lovenox injections Insulin resistance Atrial fibrillation with controlled rhythm GERD Hypercholesteremia History of mild congestive heart failure with elevated BNP.  Discharged Condition: Improved  Operations/Procedues: None   Hospital Course: Patient was admitted to the hospital for further treatment of acute left DVT. Patient was seen in consultation by pharmacy. She was placed on Lovenox and Coumadin per protocol. Patient however was initially given 7.5 mg of warfarin. She was noted to have a continued increase of INR after one dose. She was maintained on Lovenox for bridging as per protocol. All the [place] days patient is a leg pain and swelling improved considerably. She however experienced significant hematomas and abdomen at the injection sites of the Lovenox. Her aspirin was stopped as well. Her Coumadin dose continue be followed by pharmacy. This was stopped completely with INR being observed for several days. This gradually returned to a near therapeutic range at which time her Coumadin was started on a low-dose   Disposition: 01-Home or Self Care  Discharge Orders    Future Orders Please Complete By Expires   Diet - low sodium heart healthy      Comments:   Diet as instructed for coumadin therapy.   Increase activity slowly      No wound care      Call MD for:  severe uncontrolled pain      Discharge instructions      Comments:   Follow up with primary care physician in one week. Recheck INR in office one day.     Medication List  As of 06/24/2012  3:39 PM   STOP taking these medications         aspirin 81 MG tablet      triamterene-hydrochlorothiazide 37.5-25 MG per tablet         TAKE these medications         atenolol 100 MG tablet   Commonly known as: TENORMIN   Take 100 mg by mouth daily.      atenolol 50 MG tablet   Commonly known as: TENORMIN   Take 1 tablet (50 mg total) by mouth at bedtime.      fexofenadine 180 MG tablet   Commonly known as: ALLEGRA   Take 180 mg by mouth daily as needed. For allergies        furosemide 20 MG tablet   Commonly known as: LASIX   Take 1 tablet (20 mg total) by mouth daily.      irbesartan 300 MG tablet   Commonly known as: AVAPRO   Take 300 mg by mouth daily.      meclizine 12.5 MG tablet   Commonly known as: ANTIVERT   Take 12.5 mg by mouth 3 (three) times daily as needed.      RABEprazole 20 MG tablet   Commonly known as: ACIPHEX   Take 20 mg by mouth daily.      sodium chloride 0.65 % nasal spray   Commonly known as: OCEAN   Place 1 spray into both nostrils daily as needed. For allergies      spironolactone 25 MG tablet   Commonly known as: ALDACTONE   Take 1 tablet (25 mg total) by mouth daily.      warfarin 2.5 MG tablet  Commonly known as: COUMADIN   Take 1 tablet (2.5 mg total) by mouth daily at 6 PM.             Signed: Maiko Salais 06/24/2012, 3:39 PM

## 2012-08-14 ENCOUNTER — Encounter (HOSPITAL_COMMUNITY): Payer: Self-pay

## 2012-08-14 ENCOUNTER — Emergency Department (INDEPENDENT_AMBULATORY_CARE_PROVIDER_SITE_OTHER)
Admission: EM | Admit: 2012-08-14 | Discharge: 2012-08-14 | Disposition: A | Discharge status: Home / Self Care | Payer: Medicare Other | Source: Home / Self Care | Attending: Emergency Medicine | Admitting: Emergency Medicine

## 2012-08-14 DIAGNOSIS — T63461A Toxic effect of venom of wasps, accidental (unintentional), initial encounter: Secondary | ICD-10-CM

## 2012-08-14 DIAGNOSIS — T6391XA Toxic effect of contact with unspecified venomous animal, accidental (unintentional), initial encounter: Secondary | ICD-10-CM

## 2012-08-14 MED ORDER — DIPHENHYDRAMINE HCL 25 MG PO CAPS
25.0000 mg | ORAL_CAPSULE | Freq: Four times a day (QID) | ORAL | Status: DC | PRN
  Administered 2012-08-14: 25 mg via ORAL

## 2012-08-14 MED ORDER — DIPHENHYDRAMINE HCL 25 MG PO CAPS
ORAL_CAPSULE | ORAL | Status: AC
  Filled 2012-08-14: qty 1

## 2012-08-14 NOTE — ED Provider Notes (Signed)
History     CSN: [id number]  Arrival date & time 08/14/12  1544   None     Chief Complaint  Patient presents with  . Insect Bite    (Consider location/radiation/quality/duration/timing/severity/associated sxs/prior treatment) The history is provided by the patient and a relative.   Patient reports she was on the porch and ran into a yellow jacket nest.  Erskine Emery three times prior to arrival.  Immediate shortness of breath, denies feeling throat closing, states no history of allergic reactions related to stings in the past.   Past Medical History  Diagnosis Date  . Allergic rhinitis due to pollen   . Essential hypertension, malignant   . Pure hypercholesterolemia   . Heart murmur   . GERD (gastroesophageal reflux disease)   . Congestive heart failure, unspecified     patient states she is not aware of this  . Vertigo   . Atrial fibrillation with rapid ventricular response   . Dizziness   . Insulin resistance     History of insulin resistance.    Past Surgical History  Procedure Date  . Cholecystectomy   . Partial hysterectomy   . Appendectomy   . Breast biopsy     History reviewed. No pertinent family history.  History  Substance Use Topics  . Smoking status: Never Smoker   . Smokeless tobacco: Never Used  . Alcohol Use: No    OB History    Grav Para Term Preterm Abortions TAB SAB Ect Mult Living                  Review of Systems  Constitutional: Negative.   Respiratory: Negative.   Cardiovascular: Negative.     Allergies  Codeine and Sulfa antibiotics  Home Medications   Current Outpatient Rx  Name Route Sig Dispense Refill  . ATENOLOL 100 MG PO TABS Oral Take 100 mg by mouth daily.      . ATENOLOL 50 MG PO TABS Oral Take 1 tablet (50 mg total) by mouth at bedtime. 30 tablet 1  . FEXOFENADINE HCL 180 MG PO TABS Oral Take 180 mg by mouth daily as needed. For allergies     . FUROSEMIDE 20 MG PO TABS Oral Take 1 tablet (20 mg total) by mouth daily.  30 tablet 3  . IRBESARTAN 300 MG PO TABS Oral Take 300 mg by mouth daily.      Marland Kitchen SPIRONOLACTONE 25 MG PO TABS Oral Take 1 tablet (25 mg total) by mouth daily. 30 tablet 2  . WARFARIN SODIUM 2.5 MG PO TABS Oral Take 1 tablet (2.5 mg total) by mouth daily at 6 PM. 30 tablet 2  . MECLIZINE HCL 12.5 MG PO TABS Oral Take 12.5 mg by mouth 3 (three) times daily as needed.     Marland Kitchen RABEPRAZOLE SODIUM 20 MG PO TBEC Oral Take 20 mg by mouth daily.    Marland Kitchen SALINE NASAL SPRAY 0.65 % NA SOLN Each Nare Place 1 spray into both nostrils daily as needed. For allergies      BP 124/86  Pulse 95  Temp 97.6 F (36.4 C) (Oral)  Resp 16  SpO2 99%  Physical Exam  Nursing note and vitals reviewed. Constitutional: She is oriented to person, place, and time. Vital signs are normal. She appears well-developed and well-nourished. She is active and cooperative.  HENT:  Head: Normocephalic.  Mouth/Throat: Oropharynx is clear and moist.  Eyes: Conjunctivae are normal. Pupils are equal, round, and reactive to light. No scleral  icterus.  Neck: Trachea normal. Neck supple.  Cardiovascular: Normal rate, regular rhythm and normal heart sounds.   No murmur heard. Pulmonary/Chest: Effort normal and breath sounds normal. No respiratory distress. She has no wheezes. She has no rales.  Lymphadenopathy:    She has no cervical adenopathy.  Neurological: She is alert and oriented to person, place, and time. No cranial nerve deficit or sensory deficit.  Skin: Skin is warm and dry. There is erythema.       Upper lip swelling, noted redness and swelling to right posterior lower extremity.  Psychiatric: She has a normal mood and affect. Her speech is normal and behavior is normal. Judgment and thought content normal. Cognition and memory are normal.    ED Course  Procedures (including critical care time)  Labs Reviewed - No data to display No results found.   1. Yellow jacket sting       MDM  Take benadryl for the next 2-3  days, take home prescribed medications as ordered.  Return if symptoms are not improved.         Awilda Metro, NP 08/14/12 1749

## 2012-08-14 NOTE — ED Notes (Signed)
Advised this pt stung by bee just PTA, allergins to same, has not self medicated for her problem; states this looked like a small wasp; areas of concen are 2x per lip, and lower right leg; minmal wheezing, handling secretions well

## 2012-08-14 NOTE — ED Provider Notes (Signed)
Medical screening examination/treatment/procedure(s) were performed by non-physician practitioner and as supervising physician I was immediately available for consultation/collaboration.  Philipp Deputy, M.D.   Harden Mo, MD 08/14/12 [phone]

## 2012-08-14 NOTE — ED Notes (Signed)
comfortable at present; ice pack to lips; NAD at present

## 2012-08-25 ENCOUNTER — Telehealth: Payer: Self-pay | Admitting: Cardiology

## 2012-08-25 NOTE — Telephone Encounter (Signed)
New Problem:     I called the patient and was unable to reach them. I left a message on their voicemail with my name, the reason I called, the name of his physician, and a number to call back to schedule their appointment. 

## 2012-11-21 ENCOUNTER — Ambulatory Visit (HOSPITAL_COMMUNITY): Payer: Self-pay | Admitting: *Deleted

## 2012-11-21 ENCOUNTER — Encounter (HOSPITAL_COMMUNITY): Payer: Self-pay | Admitting: *Deleted

## 2012-11-21 DIAGNOSIS — I82409 Acute embolism and thrombosis of unspecified deep veins of unspecified lower extremity: Secondary | ICD-10-CM

## 2012-11-21 DIAGNOSIS — I4891 Unspecified atrial fibrillation: Secondary | ICD-10-CM

## 2012-11-21 DIAGNOSIS — I5032 Chronic diastolic (congestive) heart failure: Secondary | ICD-10-CM

## 2012-11-21 DIAGNOSIS — E8881 Metabolic syndrome: Secondary | ICD-10-CM

## 2012-11-21 DIAGNOSIS — I5033 Acute on chronic diastolic (congestive) heart failure: Secondary | ICD-10-CM | POA: Insufficient documentation

## 2012-11-21 DIAGNOSIS — I509 Heart failure, unspecified: Secondary | ICD-10-CM

## 2012-11-21 HISTORY — DX: Chronic diastolic (congestive) heart failure: I50.32

## 2013-08-10 ENCOUNTER — Other Ambulatory Visit (HOSPITAL_COMMUNITY): Payer: Self-pay | Admitting: Internal Medicine

## 2013-08-10 DIAGNOSIS — Z0000 Encounter for general adult medical examination without abnormal findings: Secondary | ICD-10-CM

## 2013-08-11 ENCOUNTER — Ambulatory Visit (HOSPITAL_COMMUNITY)
Admission: RE | Admit: 2013-08-11 | Discharge: 2013-08-11 | Disposition: A | Discharge status: Home / Self Care | Payer: Medicare Other | Source: Ambulatory Visit | Attending: Internal Medicine | Admitting: Internal Medicine

## 2013-08-11 DIAGNOSIS — Z1231 Encounter for screening mammogram for malignant neoplasm of breast: Secondary | ICD-10-CM | POA: Insufficient documentation

## 2013-08-11 DIAGNOSIS — Z0000 Encounter for general adult medical examination without abnormal findings: Secondary | ICD-10-CM

## 2013-09-07 ENCOUNTER — Other Ambulatory Visit: Payer: Self-pay | Admitting: Dermatology

## 2014-02-08 ENCOUNTER — Encounter: Payer: Self-pay | Admitting: Interventional Cardiology

## 2014-02-08 ENCOUNTER — Ambulatory Visit (INDEPENDENT_AMBULATORY_CARE_PROVIDER_SITE_OTHER): Payer: Medicare Other | Admitting: Interventional Cardiology

## 2014-02-08 VITALS — BP 132/88 | HR 75 | Ht 64.0 in | Wt 190.0 lb

## 2014-02-08 DIAGNOSIS — I4891 Unspecified atrial fibrillation: Secondary | ICD-10-CM

## 2014-02-08 DIAGNOSIS — Z7901 Long term (current) use of anticoagulants: Secondary | ICD-10-CM | POA: Insufficient documentation

## 2014-02-08 DIAGNOSIS — I10 Essential (primary) hypertension: Secondary | ICD-10-CM

## 2014-02-08 DIAGNOSIS — I5032 Chronic diastolic (congestive) heart failure: Secondary | ICD-10-CM

## 2014-02-08 HISTORY — DX: Long term (current) use of anticoagulants: Z79.01

## 2014-02-08 NOTE — Progress Notes (Signed)
Patient ID: Jasmine [name], female   DOB: 04/22/1934, 80 y.o.   MRN: [id number]    [address], [place]  [address] Phone: [phone] Fax:  [phone]  Date:  02/08/2014   ID:  Jasmine [name], DOB 1934/02/08, MRN [id number]  PCP:  Marlou Sa ERIC, MD   ASSESSMENT:  1. Chronic atrial fibrillation with controlled rate 2. Chronic diastolic heart failure, stable 3. Hypertension, controlled 4. Coumadin therapy, followed by her primary physician Dr. Kevan Ny. No bleeding complications  PLAN:  1. No change in the current medical regimen 2. Limit salt in the diet. 3. If notes increased lower extremity swelling or dyspnea she should call. 4. Clinical followup in one year   SUBJECTIVE: Jasmine [name] is a 80 y.o. female who has chronic atrial fibrillation. She has an identical twin. The patient has chronic atrial fibrillation with rate control. No medication side effects that she voices. She denies blood in her urine, stool, and recent falls or head trauma. No transient neurological symptoms. She denies chest pain. She has not had syncope orthopnea.   Wt Readings from Last 3 Encounters:  02/08/14 190 lb (86.183 kg)  10/23/12 183 lb (83.008 kg)  06/11/12 187 lb 6.4 oz (85.004 kg)     Past Medical History  Diagnosis Date  . Allergic rhinitis due to pollen   . Essential hypertension, malignant   . Pure hypercholesterolemia   . Heart murmur   . GERD (gastroesophageal reflux disease)   . Congestive heart failure, unspecified     patient states she is not aware of this  . Vertigo   . Atrial fibrillation with rapid ventricular response   . Dizziness   . Insulin resistance     History of insulin resistance.  Marland Kitchen DVT (deep venous thrombosis) left leg  . Insulin resistance   . Obesity     Current Outpatient Prescriptions  Medication Sig Dispense Refill  . atenolol (TENORMIN) 100 MG tablet Take 100 mg by mouth daily.       . furosemide (LASIX) 20 MG tablet  Take 20 mg by mouth daily.      . irbesartan (AVAPRO) 300 MG tablet Take 150 mg by mouth daily.       Marland Kitchen spironolactone (ALDACTONE) 25 MG tablet Take 25 mg by mouth daily.      Marland Kitchen warfarin (COUMADIN) 1 MG tablet Take 1 mg by mouth daily.       No current facility-administered medications for this visit.    Allergies:    Allergies  Allergen Reactions  . Codeine   . Lovenox [Enoxaparin Sodium]   . Norvasc [Amlodipine Besylate]   . Sulfa Antibiotics     Social History:  The patient  reports that she has never smoked. She has never used smokeless tobacco. She reports that she does not drink alcohol or use illicit drugs.   ROS:  Please see the history of present illness.   No falls. No melena. Denies chills or fever. Appetite is stable.   All other systems reviewed and negative.   OBJECTIVE: VS:  BP 132/88  Pulse 75  Ht 5\' 4"  (1.626 m)  Wt 190 lb (86.183 kg)  BMI 32.60 kg/m2 Well nourished, well developed, in no acute distress, appears than stated age HEENT: normal Neck: JVD flat. Carotid bruit absent  Cardiac:  normal S1, S2; IIRR; no murmur Lungs:  clear to auscultation bilaterally, no wheezing, rhonchi or rales Abd: soft, nontender, no hepatomegaly Ext:  Edema trace left greater than right. Pulses 2+ and symmetric Skin: warm and dry Neuro:  CNs 2-12 intact, no focal abnormalities noted  EKG:  Atrial fibrillation with controlled ventricular response. Right blood axis. Nonspecific ST abnormality       Signed, Illene Labrador III, MD 02/08/2014 9:51 AM

## 2014-02-08 NOTE — Patient Instructions (Signed)
Your physician recommends that you continue on your current medications as directed. Please refer to the Current Medication list given to you today.  Please call the office if you are experiencing  the following symptoms: lower extremity edema(swelling in the ankles/feet), shortness of breath. [phone]  Your physician wants you to follow-up in: 1 year You will receive a reminder letter in the mail two months in advance. If you don't receive a letter, please call our office to schedule the follow-up appointment.

## 2014-08-19 ENCOUNTER — Other Ambulatory Visit: Payer: Self-pay | Admitting: Internal Medicine

## 2014-08-19 ENCOUNTER — Ambulatory Visit
Admission: RE | Admit: 2014-08-19 | Discharge: 2014-08-19 | Disposition: A | Discharge status: Home / Self Care | Payer: Medicare Other | Source: Ambulatory Visit | Attending: Internal Medicine | Admitting: Internal Medicine

## 2014-08-19 DIAGNOSIS — R0600 Dyspnea, unspecified: Secondary | ICD-10-CM

## 2014-08-19 DIAGNOSIS — R059 Cough, unspecified: Secondary | ICD-10-CM

## 2014-08-19 DIAGNOSIS — R05 Cough: Secondary | ICD-10-CM

## 2014-08-19 DIAGNOSIS — I509 Heart failure, unspecified: Secondary | ICD-10-CM

## 2014-10-12 ENCOUNTER — Other Ambulatory Visit (HOSPITAL_COMMUNITY): Payer: Self-pay | Admitting: Internal Medicine

## 2014-10-12 DIAGNOSIS — Z1231 Encounter for screening mammogram for malignant neoplasm of breast: Secondary | ICD-10-CM

## 2014-10-21 ENCOUNTER — Ambulatory Visit (HOSPITAL_COMMUNITY)
Admission: RE | Admit: 2014-10-21 | Discharge: 2014-10-21 | Disposition: A | Discharge status: Home / Self Care | Payer: Medicare Other | Source: Ambulatory Visit | Attending: Internal Medicine | Admitting: Internal Medicine

## 2014-10-21 DIAGNOSIS — Z1231 Encounter for screening mammogram for malignant neoplasm of breast: Secondary | ICD-10-CM | POA: Insufficient documentation

## 2015-03-20 ENCOUNTER — Ambulatory Visit (INDEPENDENT_AMBULATORY_CARE_PROVIDER_SITE_OTHER): Payer: Medicare Other | Admitting: Interventional Cardiology

## 2015-03-20 ENCOUNTER — Encounter: Payer: Self-pay | Admitting: Interventional Cardiology

## 2015-03-20 VITALS — BP 118/78 | HR 58 | Ht 64.0 in | Wt 192.4 lb

## 2015-03-20 DIAGNOSIS — I4891 Unspecified atrial fibrillation: Secondary | ICD-10-CM | POA: Diagnosis not present

## 2015-03-20 DIAGNOSIS — I480 Paroxysmal atrial fibrillation: Secondary | ICD-10-CM | POA: Diagnosis not present

## 2015-03-20 DIAGNOSIS — I5032 Chronic diastolic (congestive) heart failure: Secondary | ICD-10-CM | POA: Diagnosis not present

## 2015-03-20 DIAGNOSIS — Z7901 Long term (current) use of anticoagulants: Secondary | ICD-10-CM

## 2015-03-20 DIAGNOSIS — I10 Essential (primary) hypertension: Secondary | ICD-10-CM

## 2015-03-20 LAB — BASIC METABOLIC PANEL
BUN: 44 mg/dL — AB (ref 6–23)
CO2: 24 mEq/L (ref 19–32)
CREATININE: 1.47 mg/dL — AB (ref 0.40–1.20)
Calcium: 9.8 mg/dL (ref 8.4–10.5)
Chloride: 104 mEq/L (ref 96–112)
GFR: 44.15 mL/min — AB (ref >60.00)
GLUCOSE: 147 mg/dL — AB (ref 70–99)
POTASSIUM: 5.1 meq/L (ref 3.5–5.1)
Sodium: 135 mEq/L (ref 135–145)

## 2015-03-20 NOTE — Patient Instructions (Addendum)
Your physician recommends that you continue on your current medications as directed. Please refer to the Current Medication list given to you  today.  Lab Today:[facility]  Your physician wants you to follow-up in: 1 year with Dr.Smith You will receive a reminder letter in the mail two months in advance. If you don't receive a letter, please call our office to schedule the follow-up appointment.

## 2015-03-20 NOTE — Progress Notes (Signed)
Cardiology Office Note   Date:  03/20/2015   ID:  Jasmine [name], DOB [birth year], MRN [id number]  PCP:  Kevan Ny, MD  Cardiologist:   Sinclair Grooms, MD   No chief complaint on file.     History of Present Illness: Jasmine [name] is a 81 y.o. female who presents for follow-up of atrial fibrillation, Chads score greater than 2, and hypertension. She has diastolic dysfunction.  She denies chest discomfort. No orthopnea, PND, or excessive swelling. She does have lower extremity edema. No laboratory to evaluate renal function in greater than 2 years. She is seen by Dr. Kevan Ny who I assume has been checking her labs.    Past Medical History  Diagnosis Date  . Allergic rhinitis due to pollen   . Essential hypertension, malignant   . Pure hypercholesterolemia   . Heart murmur   . GERD (gastroesophageal reflux disease)   . Congestive heart failure, unspecified     patient states she is not aware of this  . Vertigo   . Atrial fibrillation with rapid ventricular response   . Dizziness   . Insulin resistance     History of insulin resistance.  Marland Kitchen DVT (deep venous thrombosis) left leg  . Insulin resistance   . Obesity     Past Surgical History  Procedure Laterality Date  . Cholecystectomy    . Partial hysterectomy    . Appendectomy    . Breast biopsy       Current Outpatient Prescriptions  Medication Sig Dispense Refill  . atenolol (TENORMIN) 100 MG tablet Take 100 mg by mouth daily.     . furosemide (LASIX) 20 MG tablet Take 20 mg by mouth daily.    . irbesartan (AVAPRO) 300 MG tablet Take 150 mg by mouth daily.     . meclizine (ANTIVERT) 25 MG tablet Take 25 mg by mouth 3 (three) times daily as needed.  1  . spironolactone (ALDACTONE) 25 MG tablet Take 25 mg by mouth daily.    Marland Kitchen warfarin (COUMADIN) 1 MG tablet Take 1 mg by mouth daily.     No current facility-administered medications for this visit.    Allergies:   Codeine; Lovenox; Norvasc; and Sulfa  antibiotics    Social History:  The patient  reports that she has never smoked. She has never used smokeless tobacco. She reports that she does not drink alcohol or use illicit drugs.   Family History:  The patient's family history includes Diabetes in her mother; Hypertension in her sister; Pulmonary embolism in her sister.    ROS:  Please see the history of present illness.   Otherwise, review of systems are positive for ankle swelling, joint swelling, and sedentary lifestyle.   All other systems are reviewed and negative.    PHYSICAL EXAM: VS:  BP 118/78 mmHg  Pulse 58  Ht 5\' 4"  (1.626 m)  Wt 192 lb 6.4 oz (87.272 kg)  BMI 33.01 kg/m2 , BMI Body mass index is 33.01 kg/(m^2). GEN: Well nourished, well developed, in no acute distress HEENT: normal Neck: no JVD, carotid bruits, or masses Cardiac: IIRR; no murmurs, rubs, or gallops,no edema  Respiratory:  clear to auscultation bilaterally, normal work of breathing GI: soft, nontender, nondistended, + BS MS: no deformity or atrophy Skin: warm and dry, no rash Neuro:  Strength and sensation are intact Psych: euthymic mood, full affect   EKG:  EKG is ordered today. The ekg ordered today demonstrates atrial fibrillation with  right axis deviation and nonspecific T-wave abnormality. Rate controlled at 73 bpm   Recent Labs: No results found for requested labs within last 365 days.    Lipid Panel No results found for: CHOL, TRIG, HDL, CHOLHDL, VLDL, LDLCALC, LDLDIRECT    Wt Readings from Last 3 Encounters:  03/20/15 192 lb 6.4 oz (87.272 kg)  02/08/14 190 lb (86.183 kg)  10/23/12 183 lb (83.008 kg)      Other studies Reviewed: Additional studies/ records that were reviewed today include: No laboratory data recently. Review of the above records demonstrates: Last basic metabolic panel 0000000   ASSESSMENT AND PLAN:  Paroxysmal atrial fibrillation:  CHADS VASC 3. Rate is controlled.  Chronic diastolic HF (heart failure):  HFpEF without evidence of volume overload  Essential hypertension: Controlled  Long term current use of anticoagulant therapy: Followed by Dr. Marlou Sa     Current medicines are reviewed at length with the patient today.  The patient does not have concerns regarding medicines.  The following changes have been made:  no change  Labs/ tests ordered today include: Laboratory data needs to be obtained   Orders Placed This Encounter  Procedures  . Basic metabolic panel  . EKG 12-Lead     Disposition:   FU with Linard Millers in 12 months   Signed, Sinclair Grooms, MD  03/20/2015 9:06 AM    [address], [place], [place]  [address] Phone: [phone]; Fax: [phone]

## 2015-03-21 ENCOUNTER — Telehealth: Payer: Self-pay

## 2015-03-21 DIAGNOSIS — I5032 Chronic diastolic (congestive) heart failure: Secondary | ICD-10-CM

## 2015-03-21 NOTE — Telephone Encounter (Signed)
Pt aware of lab results and Dr.Smith's recommendation. The kidney function is mildly increased. Please ask her to decrease the Aldactone to 12.5 mg daily (one half tablet daily or one tablet on Monday, Wednesday, and Friday). After 7-10 days she needs to have the basic metabolic panel repeated. Pt will reduce Aldactone 12.5mg  daily, pt will come to the office on 4/21 for repeat bmet

## 2015-03-21 NOTE — Telephone Encounter (Signed)
-----   Message from Belva Crome, MD sent at 03/20/2015  5:37 PM EDT ----- The kidney function is mildly increased. Please ask her to decrease the Aldactone to 12.5 mg daily (one half tablet daily or one tablet on Monday, Wednesday, and Friday). After 7-10 days she needs to have the basic metabolic panel repeated.

## 2015-03-30 ENCOUNTER — Other Ambulatory Visit (INDEPENDENT_AMBULATORY_CARE_PROVIDER_SITE_OTHER): Payer: Medicare Other | Admitting: *Deleted

## 2015-03-30 ENCOUNTER — Telehealth: Payer: Self-pay

## 2015-03-30 DIAGNOSIS — I5032 Chronic diastolic (congestive) heart failure: Secondary | ICD-10-CM | POA: Diagnosis not present

## 2015-03-30 LAB — BASIC METABOLIC PANEL
BUN: 32 mg/dL — ABNORMAL HIGH (ref 6–23)
CHLORIDE: 105 meq/L (ref 96–112)
CO2: 24 mEq/L (ref 19–32)
Calcium: 9.8 mg/dL (ref 8.4–10.5)
Creatinine, Ser: 1.34 mg/dL — ABNORMAL HIGH (ref 0.40–1.20)
GFR: 49.13 mL/min — ABNORMAL LOW (ref >60.00)
Glucose, Bld: 103 mg/dL — ABNORMAL HIGH (ref 70–99)
POTASSIUM: 4.1 meq/L (ref 3.5–5.1)
Sodium: 136 mEq/L (ref 135–145)

## 2015-04-03 ENCOUNTER — Telehealth: Payer: Self-pay | Admitting: Interventional Cardiology

## 2015-04-03 NOTE — Telephone Encounter (Signed)
New message      Want lab resultsl; and pt was instructed to take 1/2 tablet of aldactone for 10 days.  Since she had labs recent, should she continue 1/2 tablet or go to 1 tablet daily?

## 2015-04-03 NOTE — Telephone Encounter (Signed)
Provided patient with lab results. Patient continues to take medication (Aldactone) as Dr. Tamala Julian advised. Patient verbalized understanding and appreciation for return phone call.

## 2015-05-04 NOTE — Telephone Encounter (Signed)
error 

## 2015-11-08 ENCOUNTER — Other Ambulatory Visit: Payer: Self-pay

## 2015-11-08 DIAGNOSIS — Z1231 Encounter for screening mammogram for malignant neoplasm of breast: Secondary | ICD-10-CM

## 2015-12-13 ENCOUNTER — Ambulatory Visit
Admission: RE | Admit: 2015-12-13 | Discharge: 2015-12-13 | Disposition: A | Discharge status: Home / Self Care | Payer: Medicare Other | Source: Ambulatory Visit

## 2015-12-13 DIAGNOSIS — Z1231 Encounter for screening mammogram for malignant neoplasm of breast: Secondary | ICD-10-CM

## 2016-01-10 ENCOUNTER — Other Ambulatory Visit: Payer: Self-pay | Admitting: Internal Medicine

## 2016-01-10 DIAGNOSIS — R945 Abnormal results of liver function studies: Principal | ICD-10-CM

## 2016-01-10 DIAGNOSIS — R7989 Other specified abnormal findings of blood chemistry: Secondary | ICD-10-CM

## 2016-01-10 DIAGNOSIS — R109 Unspecified abdominal pain: Secondary | ICD-10-CM

## 2016-01-17 ENCOUNTER — Ambulatory Visit
Admission: RE | Admit: 2016-01-17 | Discharge: 2016-01-17 | Disposition: A | Discharge status: Home / Self Care | Payer: Medicare Other | Source: Ambulatory Visit | Attending: Internal Medicine | Admitting: Internal Medicine

## 2016-01-17 DIAGNOSIS — R109 Unspecified abdominal pain: Secondary | ICD-10-CM

## 2016-01-17 DIAGNOSIS — R945 Abnormal results of liver function studies: Principal | ICD-10-CM

## 2016-01-17 DIAGNOSIS — R7989 Other specified abnormal findings of blood chemistry: Secondary | ICD-10-CM

## 2016-08-06 ENCOUNTER — Encounter: Payer: Self-pay | Admitting: Interventional Cardiology

## 2016-08-06 ENCOUNTER — Ambulatory Visit (INDEPENDENT_AMBULATORY_CARE_PROVIDER_SITE_OTHER): Payer: Medicare Other | Admitting: Interventional Cardiology

## 2016-08-06 ENCOUNTER — Encounter (INDEPENDENT_AMBULATORY_CARE_PROVIDER_SITE_OTHER): Payer: Self-pay

## 2016-08-06 VITALS — BP 126/88 | HR 56 | Ht 64.0 in | Wt 189.8 lb

## 2016-08-06 DIAGNOSIS — I10 Essential (primary) hypertension: Secondary | ICD-10-CM | POA: Diagnosis not present

## 2016-08-06 DIAGNOSIS — Z7901 Long term (current) use of anticoagulants: Secondary | ICD-10-CM | POA: Diagnosis not present

## 2016-08-06 DIAGNOSIS — I480 Paroxysmal atrial fibrillation: Secondary | ICD-10-CM | POA: Diagnosis not present

## 2016-08-06 DIAGNOSIS — I5032 Chronic diastolic (congestive) heart failure: Secondary | ICD-10-CM

## 2016-08-06 LAB — BASIC METABOLIC PANEL
BUN: 51 mg/dL — ABNORMAL HIGH (ref 7–25)
CHLORIDE: 101 mmol/L (ref 98–110)
CO2: 21 mmol/L (ref 20–31)
Calcium: 9.4 mg/dL (ref 8.6–10.4)
Creat: 1.54 mg/dL — ABNORMAL HIGH (ref 0.60–0.93)
Glucose, Bld: 118 mg/dL — ABNORMAL HIGH (ref 65–99)
POTASSIUM: 4.9 mmol/L (ref 3.5–5.3)
SODIUM: 135 mmol/L (ref 135–146)

## 2016-08-06 LAB — PROTIME-INR
INR: 1.5 — AB
PROTHROMBIN TIME: 15.5 s — AB (ref 9.0–11.5)

## 2016-08-06 NOTE — Patient Instructions (Signed)
Medication Instructions:  Your physician recommends that you continue on your current medications as directed. Please refer to the Current Medication list given to you today.   Labwork: BMET/PT/INR today  Testing/Procedures: None   Follow-Up: Your physician wants you to follow-up in: 1 year with Dr Tamala Julian. (August 2018) You will receive a reminder letter in the mail two months in advance. If you don't receive a letter, please call our office to schedule the follow-up appointment.        If you need a refill on your cardiac medications before your next appointment, please call your pharmacy.

## 2016-08-06 NOTE — Progress Notes (Signed)
Cardiology Office Note    Date:  08/06/2016   ID:  Jasmine [name], DOB 23-Jun-1934, MRN [id number]  PCP:  [name], MD  Cardiologist: Sinclair Grooms, MD   Chief Complaint  Patient presents with  . Atrial Fibrillation    History of Present Illness:  Jasmine [name] is a 82 y.o. female for follow-up of atrial fibrillation, Chads score greater than 2, and hypertension. She has diastolic dysfunction.  When she was initially diagnosed with atrial fibrillation, she developed acute diastolic heart failure. On diuretic therapy, volume status is been stable. She denies dyspnea. She now feels dizzy frequently. She has a history of vertigo. She wonders if she is on too much diuretic therapy. She's had no blood work done by her primary care physician in several months. This includes INR monitoring.    Past Medical History:  Diagnosis Date  . Allergic rhinitis due to pollen   . Atrial fibrillation with rapid ventricular response ([place])   . Congestive heart failure, unspecified    patient states she is not aware of this  . Dizziness   . DVT (deep venous thrombosis) ([facility]CC) left leg  . Essential hypertension, malignant   . GERD (gastroesophageal reflux disease)   . Heart murmur   . Insulin resistance    History of insulin resistance.  . Insulin resistance   . Obesity   . Pure hypercholesterolemia   . Vertigo     Past Surgical History:  Procedure Laterality Date  . APPENDECTOMY    . BREAST BIOPSY    . CHOLECYSTECTOMY    . PARTIAL HYSTERECTOMY      Current Medications: Outpatient Medications Prior to Visit  Medication Sig Dispense Refill  . atenolol (TENORMIN) 100 MG tablet Take 100 mg by mouth daily.     . furosemide (LASIX) 20 MG tablet Take 20 mg by mouth daily.    . irbesartan (AVAPRO) 300 MG tablet Take 150 mg by mouth daily.     . meclizine (ANTIVERT) 25 MG tablet Take 25 mg by mouth 3 (three) times daily as needed.  1  . spironolactone (ALDACTONE) 25 MG tablet Take  12.5 mg by mouth daily.    Marland Kitchen warfarin (COUMADIN) 1 MG tablet Take 1.5 mg by mouth daily. EXCEPT on MONDAYS, WEDNESDAYS, and FRIDAYS - Take two (2) tablets (2 mg total) by mouth daily.     No facility-administered medications prior to visit.      Allergies:   Codeine; Lovenox [enoxaparin sodium]; Norvasc [amlodipine besylate]; and Sulfa antibiotics   Social History   Social History  . Marital status: Single    Spouse name: N/A  . Number of children: N/A  . Years of education: N/A   Social History Main Topics  . Smoking status: Never Smoker  . Smokeless tobacco: Never Used  . Alcohol use No  . Drug use: No  . Sexual activity: Not Currently    Birth control/ protection: Post-menopausal   Other Topics Concern  . None   Social History Narrative  . None     Family History:  The patient's family history includes Diabetes in her mother; Hypertension in her sister; Pulmonary embolism in her sister.   ROS:   Please see the history of present illness.    Dizzy, recent diagnosis of vertigo, occasional shortness of breath  All other systems reviewed and are negative.   PHYSICAL EXAM:   VS:  BP 126/88   Pulse (!) 56   Ht 5'  4" (1.626 m)   Wt 189 lb 12.8 oz (86.1 kg)   BMI 32.58 kg/m    GEN: Well nourished, well developed, in no acute distress  HEENT: normal  Neck: no JVD, carotid bruits, or masses Cardiac: IIRR; no murmurs, rubs, or gallops,no edema  Respiratory:  clear to auscultation bilaterally, normal work of breathing GI: soft, nontender, nondistended, + BS MS: no deformity or atrophy  Skin: warm and dry, no rash Neuro:  Alert and Oriented x 3, Strength and sensation are intact Psych: euthymic mood, full affect  Wt Readings from Last 3 Encounters:  08/06/16 189 lb 12.8 oz (86.1 kg)  03/20/15 192 lb 6.4 oz (87.3 kg)  02/08/14 190 lb (86.2 kg)      Studies/Labs Reviewed:   EKG:  EKG  Atrial fibrillation with slow ventricular response at 56 bpm. No significant  change compared to prior.  Recent Labs: No results found for requested labs within last 8760 hours.   Lipid Panel No results found for: CHOL, TRIG, HDL, CHOLHDL, VLDL, LDLCALC, LDLDIRECT  Additional studies/ records that were reviewed today include:  No new data is apparent in Epic at last blood work was in April 2016.    ASSESSMENT:    1. Paroxysmal atrial fibrillation (HCC)   2. Chronic diastolic heart failure ([place])   3. Long term current use of anticoagulant therapy   4. Essential hypertension      PLAN:  In order of problems listed above:  1. Atrial fibrillation is chronic and has good rate control. Future consideration will be decreasing beta blocker intensity as her ventricular response is relatively slow. 2. No evidence of volume overload. Exclude volume contraction, we will obtain a basic metabolic panel today.. If this is noted we will either discontinue Aldactone or discontinue furosemide. If agreed EC furosemide 3. We will check INR today. 4. Low-salt diet is advocated. Blood pressure is well controlled today.    Medication Adjustments/Labs and Tests Ordered: Current medicines are reviewed at length with the patient today.  Concerns regarding medicines are outlined above.  Medication changes, Labs and Tests ordered today are listed in the Patient Instructions below. There are no Patient Instructions on file for this visit.   Signed, Sinclair Grooms, MD  08/06/2016 8:31 AM    [facility] [place], [place], [place]  [address] Phone: [phone]; Fax: [phone]

## 2016-08-07 ENCOUNTER — Other Ambulatory Visit: Payer: Self-pay

## 2016-08-07 NOTE — Progress Notes (Signed)
Pt aware of lab results and Dr.Smith's recommendation. Let the patient know the labs show evidence of volume contraction/dehydration. Discontinue spironolactone. Keep other therapy unchanged. Lead her know that we have sent a copy of this lab to Dr. Marlou Sa. Her blood is not properly thinned. He will likely won't make a change in the Coumadin dose. I will not make recommendations as this is something he takes care of. A copy will be sent to [name], MD  Pt verbalized understanding.

## 2016-11-28 ENCOUNTER — Encounter: Payer: Self-pay | Admitting: Vascular Surgery

## 2016-11-28 ENCOUNTER — Ambulatory Visit (INDEPENDENT_AMBULATORY_CARE_PROVIDER_SITE_OTHER): Payer: Medicare Other | Admitting: Vascular Surgery

## 2016-11-28 ENCOUNTER — Other Ambulatory Visit: Payer: Self-pay | Admitting: Vascular Surgery

## 2016-11-28 ENCOUNTER — Ambulatory Visit (HOSPITAL_COMMUNITY)
Admission: RE | Admit: 2016-11-28 | Discharge: 2016-11-28 | Disposition: A | Discharge status: Home / Self Care | Payer: Medicare Other | Source: Ambulatory Visit | Attending: Vascular Surgery | Admitting: Vascular Surgery

## 2016-11-28 VITALS — BP 158/78 | HR 54 | Temp 97.0°F | Resp 18 | Ht 64.0 in | Wt 190.7 lb

## 2016-11-28 DIAGNOSIS — R600 Localized edema: Secondary | ICD-10-CM | POA: Diagnosis not present

## 2016-11-28 DIAGNOSIS — I83892 Varicose veins of left lower extremities with other complications: Secondary | ICD-10-CM | POA: Insufficient documentation

## 2016-11-28 NOTE — Progress Notes (Signed)
Referring Physician: Dr Kevan Ny Patient name: Jasmine [name] MRN: [id number] DOB: 09-13-1934 Sex: female  REASON FOR CONSULT: bleeding varicose veins  HPI: Jasmine [name] is a 82 y.o. female, with a recent episode of bleeding from a left leg varicosity. She has had no episodes prior to this. She does have a history of DVT in 2013. She is on chronic Coumadin for atrial fibrillation. She has a chronically swollen left leg since her DVT in 2013. She denies family history of varicose veins. She states she has had scattered varicosities in both lower extremities for several years. She was recently prescribed thigh-high compression stockings but was not compliant wearing these because she could not get them on. Other medical problems include hypertension obesity which she is currently trying to control. She is a fairly poor historian.  Past Medical History:  Diagnosis Date  . Allergic rhinitis due to pollen   . Atrial fibrillation with rapid ventricular response ([place])   . Congestive heart failure, unspecified ([place])    patient states she is not aware of this  . Dizziness   . DVT (deep venous thrombosis) (HCC) left leg  . Essential hypertension, malignant   . GERD (gastroesophageal reflux disease)   . Heart murmur   . Insulin resistance    History of insulin resistance.  . Insulin resistance   . Obesity   . Pure hypercholesterolemia   . Vertigo    Past Surgical History:  Procedure Laterality Date  . APPENDECTOMY    . BREAST BIOPSY    . CHOLECYSTECTOMY    . PARTIAL HYSTERECTOMY      Family History  Problem Relation Age of Onset  . Diabetes Mother   . Hypertension Sister   . Pulmonary embolism Sister     SOCIAL HISTORY: Social History   Social History  . Marital status: Single    Spouse name: N/A  . Number of children: N/A  . Years of education: N/A   Occupational History  . Not on file.   Social History Main Topics  . Smoking status: Never Smoker  . Smokeless  tobacco: Never Used  . Alcohol use No  . Drug use: No  . Sexual activity: Not Currently    Birth control/ protection: Post-menopausal   Other Topics Concern  . Not on file   Social History Narrative  . No narrative on file    Allergies  Allergen Reactions  . Codeine   . Lovenox [Enoxaparin Sodium]   . Norvasc [Amlodipine Besylate]   . Sulfa Antibiotics     Current Outpatient Prescriptions  Medication Sig Dispense Refill  . atenolol (TENORMIN) 100 MG tablet Take 100 mg by mouth daily.     . furosemide (LASIX) 20 MG tablet Take 20 mg by mouth daily.    . irbesartan (AVAPRO) 300 MG tablet Take 150 mg by mouth daily.     . meclizine (ANTIVERT) 25 MG tablet Take 25 mg by mouth 3 (three) times daily as needed.  1  . warfarin (COUMADIN) 1 MG tablet Take 1.5 mg by mouth daily. EXCEPT on MONDAYS, WEDNESDAYS, and FRIDAYS - Take two (2) tablets (2 mg total) by mouth daily.     No current facility-administered medications for this visit.     ROS:   General:  No weight loss, Fever, chills  HEENT: No recent headaches, no nasal bleeding, no visual changes, no sore throat  Neurologic: No dizziness, blackouts, seizures. No recent symptoms of stroke or mini- stroke. No  recent episodes of slurred speech, or temporary blindness.  Cardiac: No recent episodes of chest pain/pressure, no shortness of breath at rest.  + shortness of breath with exertion. + history of atrial fibrillation or irregular heartbeat  Vascular: No history of rest pain in feet.  No history of claudication.  No history of non-healing ulcer, + history of DVT   Pulmonary: No home oxygen, no productive cough, no hemoptysis,  No asthma or wheezing  Musculoskeletal:  [X]  Arthritis, [ ]  Low back pain,  [ ]  Joint pain  Hematologic:No history of hypercoagulable state.  No history of easy bleeding.  No history of anemia  Gastrointestinal: No hematochezia or melena,  No gastroesophageal reflux, no trouble swallowing  Urinary:  [ ]  chronic Kidney disease, [ ]  on HD - [ ]  MWF or [ ]  TTHS, [ ]  Burning with urination, [ ]  Frequent urination, [ ]  Difficulty urinating;   Skin: No rashes  Psychological: No history of anxiety,  No history of depression   Physical Examination  General:  Alert and oriented, no acute distress HEENT: Normal Neck: No bruit or JVD Pulmonary: Clear to auscultation bilaterally Cardiac: Regular Rate and Rhythm without murmur Abdomen: Soft, non-tender, non-distended, no mass, no scars Skin: No rash, scattered spider-type varicosities bilateral lower extremities left greater than right, some medial left calf varicosities about 3-4 mm diameter palpable. No open ulceration. Extremity Pulses:  2+ radial, brachial, femoral, 2+ right dorsalis pedis, absent dorsalis pedis posterior tibial pulse left foot Musculoskeletal: No deformity trace left edema compared to right leg.  Neurologic: Upper and lower extremity motor 5/5 and symmetric  DATA:  Patient had a venous reflux exam of the left leg today. This showed no evidence of DVT. She had no deep vein reflux. She did have reflux and a distal portion of the left greater saphenous vein below the knee but vein diameter was only about 3 mm. She did have reflux in her left lesser saphenous vein with vein diameter 5-7 mm.  ASSESSMENT:  Symptomatic varicose veins with recent bleeding episode left leg. She has evidence of reflux in the superficial system especially in the left lesser saphenous vein.   PLAN:  I offered the patient laser ablation of her left lesser saphenous vein today. However, she is reluctant to proceed with a procedure currently. I gave her as another option trying to use knee-high compression stockings instead to improve her symptoms. She was fitted for these today. She will call us back in the future if she has an additional bleeding episode or wishes to proceed with laser ablation of her left lesser saphenous vein.   Ruta Hinds,  MD [facility] Office: [phone] Pager: [phone]

## 2017-03-05 ENCOUNTER — Encounter: Payer: Self-pay | Admitting: Interventional Cardiology

## 2017-03-17 NOTE — Progress Notes (Signed)
Cardiology Office Note    Date:  03/18/2017   ID:  Jasmine [name], DOB July 09, 1934, MRN [id number]  PCP:  [name], MD  Cardiologist: Sinclair Grooms, MD   Chief Complaint  Patient presents with  . Shortness of Breath  . Atrial Fibrillation    History of Present Illness:  Jasmine [name] is a 83 y.o. female follow-up of atrial fibrillation, CHADS score greater than 2, diastolic heart failure, and hypertension.   Dyspnea on exertion. She denies orthopnea PND. She has a history of atrial fibrillation. She is compliant with medications. She denies chest pain, syncope, has significant lower extremity swelling. No PND has occurred. Relatively sedentary lifestyle.His shortness of breath when she walks distances. This is a relatively uncommon occurrence.  Past Medical History:  Diagnosis Date  . Allergic rhinitis due to pollen   . Atrial fibrillation with rapid ventricular response ([place])   . Congestive heart failure, unspecified    patient states she is not aware of this  . Dizziness   . DVT (deep venous thrombosis) (HCC) left leg  . Essential hypertension, malignant   . GERD (gastroesophageal reflux disease)   . Heart murmur   . Insulin resistance    History of insulin resistance.  . Insulin resistance   . Obesity   . Pure hypercholesterolemia   . Vertigo     Past Surgical History:  Procedure Laterality Date  . APPENDECTOMY    . BREAST BIOPSY    . CHOLECYSTECTOMY    . PARTIAL HYSTERECTOMY      Current Medications: Outpatient Medications Prior to Visit  Medication Sig Dispense Refill  . atenolol (TENORMIN) 100 MG tablet Take 100 mg by mouth daily.     . furosemide (LASIX) 20 MG tablet Take 20 mg by mouth daily.    . irbesartan (AVAPRO) 300 MG tablet Take 150 mg by mouth daily.     . meclizine (ANTIVERT) 25 MG tablet Take 25 mg by mouth 3 (three) times daily as needed.  1  . spironolactone (ALDACTONE) 25 MG tablet Take 25 mg by mouth daily.     Marland Kitchen warfarin  (COUMADIN) 1 MG tablet Take 1.5 mg by mouth daily. EXCEPT on MONDAYS, WEDNESDAYS, and FRIDAYS - Take two (2) tablets (2 mg total) by mouth daily.     No facility-administered medications prior to visit.      Allergies:   Codeine; Lovenox [enoxaparin sodium]; Norvasc [amlodipine besylate]; and Sulfa antibiotics   Social History   Social History  . Marital status: Single    Spouse name: N/A  . Number of children: N/A  . Years of education: N/A   Social History Main Topics  . Smoking status: Never Smoker  . Smokeless tobacco: Never Used  . Alcohol use No  . Drug use: No  . Sexual activity: Not Currently    Birth control/ protection: Post-menopausal   Other Topics Concern  . None   Social History Narrative  . None     Family History:  The patient's family history includes Diabetes in her mother; Hypertension in her sister; Pulmonary embolism in her sister.   ROS:   Please see the history of present illness.    Constipation, irregular heartbeat, excessive fatigue, dizziness and dyspnea on exertion.  All other systems reviewed and are negative.   PHYSICAL EXAM:   VS:  BP 136/68   Pulse 86   Ht 5\' 4"  (1.626 m)   Wt 188 lb (85.3 kg)  SpO2 98%   BMI 32.27 kg/m    GEN: Well nourished, well developed, in no acute distress  HEENT: normal  Neck: no JVD, carotid bruits, or masses Cardiac: iiRR; no murmurs, rubs, or gallops,no edema  Respiratory:  clear to auscultation bilaterally, normal work of breathing GI: soft, nontender, nondistended, + BS MS: no deformity or atrophy  Skin: warm and dry, no rash Neuro:  Alert and Oriented x 3, Strength and sensation are intact Psych: euthymic mood, full affect  Wt Readings from Last 3 Encounters:  03/18/17 188 lb (85.3 kg)  11/28/16 190 lb 11.2 oz (86.5 kg)  08/06/16 189 lb 12.8 oz (86.1 kg)      Studies/Labs Reviewed:   EKG:  EKG  Atrial fibrillation with heart rate 86 bpm. No evidence of infarction.  Recent  Labs: 08/06/2016: BUN 51; Creat 1.54; Potassium 4.9; Sodium 135   Lipid Panel No results found for: CHOL, TRIG, HDL, CHOLHDL, VLDL, LDLCALC, LDLDIRECT  Additional studies/ records that were reviewed today include:   Remote echocardiogram from 2013: Study Conclusions  - Left ventricle: The cavity size was normal. There was mild concentric hypertrophy. Systolic function was normal. The estimated ejection fraction was in the range of 50% to 55%. Regional wall motion abnormalities cannot be excluded. - Mitral valve: Moderate regurgitation. - Left atrium: The atrium was moderately dilated. - Atrial septum: The septum bowed from left to right, consistent with increased left atrial pressure. - Tricuspid valve: Moderate regurgitation. - Pulmonary arteries: PA peak pressure: 92mm Hg (S).   ASSESSMENT:    1. Paroxysmal atrial fibrillation (HCC)   2. Chronic diastolic HF (heart failure) ([place])   3. Dyspnea on exertion   4. Essential hypertension   5. Long term current use of anticoagulant therapy      PLAN:  In order of problems listed above:  1. Continuous atrial fibrillation with borderline rate control. A 24-hour Holter with the patient being active is recommended to ensure that we have adequate rate control. 2. No evidence of volume overload but concerning symptom of dyspnea on exertion. We need to exclude LV systolic dysfunction. 2-D Doppler echocardiogram will be performed. 3. This problem could be related to physical deconditioning, poor rate control of atrial fibrillation, or worsening left ventricular systolic function. 24-hour Holter monitor with activity and a 2-D Doppler echocardiogram as noted above will be performed. 4. Blood pressure target is 140/90 mmHg less. Adequate blood pressure today. 2 g sodium diet is advocated. 5. No bleeding complications on Coumadin.    Medication Adjustments/Labs and Tests Ordered: Current medicines are reviewed at length with  the patient today.  Concerns regarding medicines are outlined above.  Medication changes, Labs and Tests ordered today are listed in the Patient Instructions below. Patient Instructions  Medication Instructions:  None  Labwork: None  Testing/Procedures: Your physician has requested that you have an echocardiogram. Echocardiography is a painless test that uses sound waves to create images of your heart. It provides your doctor with information about the size and shape of your heart and how well your heart's chambers and valves are working. This procedure takes approximately one hour. There are no restrictions for this procedure.  Your physician has recommended that you wear a 24 hour holter monitor. Holter monitors are medical devices that record the heart's electrical activity. Doctors most often use these monitors to diagnose arrhythmias. Arrhythmias are problems with the speed or rhythm of the heartbeat. The monitor is a small, portable device. You can wear one while  you do your normal daily activities. This is usually used to diagnose what is causing palpitations/syncope (passing out).    Follow-Up: Your physician wants you to follow-up in: 1 year with Dr. Tamala Julian. You will receive a reminder letter in the mail two months in advance. If you don't receive a letter, please call our office to schedule the follow-up appointment.   Any Other Special Instructions Will Be Listed Below (If Applicable).     If you need a refill on your cardiac medications before your next appointment, please call your pharmacy.      Signed, Sinclair Grooms, MD  03/18/2017 8:39 AM    [facility] [address], [place], [place]  [address] Phone: [phone]; Fax: [phone]

## 2017-03-18 ENCOUNTER — Ambulatory Visit (INDEPENDENT_AMBULATORY_CARE_PROVIDER_SITE_OTHER): Payer: Medicare Other | Admitting: Interventional Cardiology

## 2017-03-18 ENCOUNTER — Encounter: Payer: Self-pay | Admitting: Interventional Cardiology

## 2017-03-18 VITALS — BP 136/68 | HR 86 | Ht 64.0 in | Wt 188.0 lb

## 2017-03-18 DIAGNOSIS — I5032 Chronic diastolic (congestive) heart failure: Secondary | ICD-10-CM

## 2017-03-18 DIAGNOSIS — I10 Essential (primary) hypertension: Secondary | ICD-10-CM | POA: Diagnosis not present

## 2017-03-18 DIAGNOSIS — I480 Paroxysmal atrial fibrillation: Secondary | ICD-10-CM | POA: Diagnosis not present

## 2017-03-18 DIAGNOSIS — R0609 Other forms of dyspnea: Secondary | ICD-10-CM

## 2017-03-18 DIAGNOSIS — Z7901 Long term (current) use of anticoagulants: Secondary | ICD-10-CM

## 2017-03-18 NOTE — Patient Instructions (Signed)
Medication Instructions:  None  Labwork: None  Testing/Procedures: Your physician has requested that you have an echocardiogram. Echocardiography is a painless test that uses sound waves to create images of your heart. It provides your doctor with information about the size and shape of your heart and how well your heart's chambers and valves are working. This procedure takes approximately one hour. There are no restrictions for this procedure.  Your physician has recommended that you wear a 24 hour holter monitor. Holter monitors are medical devices that record the heart's electrical activity. Doctors most often use these monitors to diagnose arrhythmias. Arrhythmias are problems with the speed or rhythm of the heartbeat. The monitor is a small, portable device. You can wear one while you do your normal daily activities. This is usually used to diagnose what is causing palpitations/syncope (passing out).    Follow-Up: Your physician wants you to follow-up in: 1 year with Dr. Tamala Julian. You will receive a reminder letter in the mail two months in advance. If you don't receive a letter, please call our office to schedule the follow-up appointment.   Any Other Special Instructions Will Be Listed Below (If Applicable).     If you need a refill on your cardiac medications before your next appointment, please call your pharmacy.

## 2017-03-25 ENCOUNTER — Ambulatory Visit (INDEPENDENT_AMBULATORY_CARE_PROVIDER_SITE_OTHER): Payer: Medicare Other

## 2017-03-25 DIAGNOSIS — I480 Paroxysmal atrial fibrillation: Secondary | ICD-10-CM | POA: Diagnosis not present

## 2017-04-01 ENCOUNTER — Other Ambulatory Visit: Payer: Self-pay

## 2017-04-01 ENCOUNTER — Ambulatory Visit (HOSPITAL_COMMUNITY): Payer: Medicare Other | Attending: Cardiovascular Disease

## 2017-04-01 DIAGNOSIS — I081 Rheumatic disorders of both mitral and tricuspid valves: Secondary | ICD-10-CM | POA: Insufficient documentation

## 2017-04-01 DIAGNOSIS — I480 Paroxysmal atrial fibrillation: Secondary | ICD-10-CM

## 2017-04-03 ENCOUNTER — Telehealth: Payer: Self-pay | Admitting: Interventional Cardiology

## 2017-04-03 ENCOUNTER — Telehealth: Payer: Self-pay | Admitting: *Deleted

## 2017-04-03 DIAGNOSIS — I5032 Chronic diastolic (congestive) heart failure: Secondary | ICD-10-CM

## 2017-04-03 MED ORDER — FUROSEMIDE 40 MG PO TABS: 40.0000 mg | ORAL_TABLET | Freq: Every day | ORAL | 3 refills | Status: DC

## 2017-04-03 NOTE — Telephone Encounter (Signed)
-----   Message from Belva Crome, MD sent at 04/02/2017  1:02 PM EDT ----- Let the patient know pressure in heart is increasing. Have her increase furosemide to 40 mg daily. BMET in one week and OV with APP to f/u Diastolic HF and dyspnea. A copy will be sent to [name], MD

## 2017-04-03 NOTE — Telephone Encounter (Signed)
Spoke with pt and went over echo results and recommendations per Dr. Tamala Julian.  Pt verbalized understanding and was in agreement with plan.  Scheduled pt to come in on 04/14/17 to see Cecilie Kicks, NP.  Pt will have labs drawn same day.  Pt appreciative for assistance.  Updated prescription sent to preferred pharmacy.

## 2017-04-03 NOTE — Telephone Encounter (Signed)
Spoke with pt and advised her to continue Spironolactone as prescribed.  Pt verbalized understanding and was appreciative for call.

## 2017-04-03 NOTE — Telephone Encounter (Signed)
New message    Pt states that dr Tamala Julian increased the lasix, does she need to take  spironolactone (ALDACTONE) 25 MG tablet Take 25 mg by mouth daily.     this medication as well?

## 2017-04-13 NOTE — Progress Notes (Signed)
Cardiology Office Note   Date:  04/14/2017   ID:  Jasmine [name], DOB 03/11/1934, MRN [id number]  PCP:  [name], MD  Cardiologist:  Dr. Tamala Julian    Chief Complaint  Patient presents with  . Shortness of Breath    and abnormal echo.        History of Present Illness: Jasmine [name] is a 83 y.o. female who presents for elevated PA pressure pk was 48 mmHg mild LVH, EF 60-65%, no RWMA.  Mod MR, LA ws severely dilated.  Mod to severer TR--she had Dyspnea and lasix increased to 40 mg daily.  She has a hx of permanent atrial fib and CHADS 2 on coumadin, diastolic HF and HTN.   Since last visit She has worn a holter monitor. Review of monitor with a fib occ PVCs which she is aware of and in early AM and PM she has brady to 38 to 44 at times.  occ pauses. No syncope.  Echo as above.  Today her dyspnea has improved.  We reviewed diet and she does eat out 3 times a week.  But she does watch her salt, she has tried support hose but is very difficult for her to put on and take off.   NEEDS BMP   Past Medical History:  Diagnosis Date  . Allergic rhinitis due to pollen   . Atrial fibrillation with rapid ventricular response ([place])   . Congestive heart failure, unspecified    patient states she is not aware of this  . Dizziness   . DVT (deep venous thrombosis) (HCC) left leg  . Essential hypertension, malignant   . GERD (gastroesophageal reflux disease)   . Heart murmur   . Insulin resistance    History of insulin resistance.  . Insulin resistance   . Obesity   . Pure hypercholesterolemia   . Vertigo     Past Surgical History:  Procedure Laterality Date  . APPENDECTOMY    . BREAST BIOPSY    . CHOLECYSTECTOMY    . PARTIAL HYSTERECTOMY       Current Outpatient Prescriptions  Medication Sig Dispense Refill  . atenolol (TENORMIN) 100 MG tablet Take 100 mg by mouth daily.     . furosemide (LASIX) 40 MG tablet Take 1 tablet (40 mg total) by mouth daily. 90 tablet 3  .  irbesartan (AVAPRO) 300 MG tablet Take 300 mg by mouth daily.     Marland Kitchen levalbuterol (XOPENEX HFA) 45 MCG/ACT inhaler Inhale 1 puff into the lungs as needed for wheezing.    . meclizine (ANTIVERT) 25 MG tablet Take 25 mg by mouth 3 (three) times daily as needed.  1  . spironolactone (ALDACTONE) 25 MG tablet Take 25 mg by mouth daily.     Marland Kitchen warfarin (COUMADIN) 1 MG tablet Take 1.5 mg by mouth daily. EXCEPT on MONDAYS, WEDNESDAYS, and FRIDAYS - Take two (2) tablets (2 mg total) by mouth daily.     No current facility-administered medications for this visit.     Allergies:   Codeine; Lovenox [enoxaparin sodium]; Norvasc [amlodipine besylate]; and Sulfa antibiotics    Social History:  The patient  reports that she has never smoked. She has never used smokeless tobacco. She reports that she does not drink alcohol or use drugs.   Family History:  The patient's family history includes Diabetes in her mother; Hypertension in her sister; Pulmonary embolism in her sister.    ROS:  General:no colds or fevers, no weight  changes Skin:no rashes or ulcers HEENT:no blurred vision, no congestion CV:see HPI PUL:see HPI GI:no diarrhea constipation or melena, no indigestion GU:no hematuria, no dysuria MS:no joint pain, no claudication Neuro:no syncope, no lightheadedness Endo:no diabetes, no thyroid disease  Wt Readings from Last 3 Encounters:  04/14/17 188 lb 8 oz (85.5 kg)  03/18/17 188 lb (85.3 kg)  11/28/16 190 lb 11.2 oz (86.5 kg)     PHYSICAL EXAM: VS:  BP (!) 150/80 Comment: Patient didn't   take meds this AM due to Lab's  Pulse 80   Ht 5\' 4"  (1.626 m)   Wt 188 lb 8 oz (85.5 kg)   SpO2 97%   BMI 32.36 kg/m  , BMI Body mass index is 32.36 kg/m. General:Pleasant affect, NAD Skin:Warm and dry, brisk capillary refill HEENT:normocephalic, sclera clear, mucus membranes moist Neck:supple, no JVD, no bruits  Heart:iRReg irreg without murmur, gallup, rub or click Lungs:clear to ausculation   without rales, occ rhonchi, no wheezes GNO:IBBC, non tender, + BS, do not palpate liver spleen or masses Ext:tr lower ext edema, 2+ pedal pulses, 2+ radial pulses, + varocosties Neuro:alert and oriented X 3, MAE, follows commands, + facial symmetry  She has not had BP meds today.   EKG:  EKG is NOT ordered today.    Recent Labs: 08/06/2016: BUN 51; Creat 1.54; Potassium 4.9; Sodium 135    Lipid Panel No results found for: CHOL, TRIG, HDL, CHOLHDL, VLDL, LDLCALC, LDLDIRECT     Other studies Reviewed: Additional studies/ records that were reviewed today include:   ECHO 04/01/17:. Study Conclusions  - Left ventricle: The cavity size was normal. Wall thickness was   increased in a pattern of mild LVH. Systolic function was normal.   The estimated ejection fraction was in the range of 60% to 65%.   Wall motion was normal; there were no regional wall motion   abnormalities. - Mitral valve: There was moderate regurgitation. - Left atrium: The atrium was severely dilated. - Right atrium: The atrium was moderately dilated. - Tricuspid valve: There was moderate-severe regurgitation. - Pulmonary arteries: Systolic pressure was moderately increased.   PA peak pressure: 48 mm Hg (S).  This is up from 2013 with pk pressure of 48 mmHg.    ASSESSMENT AND PLAN:  1.  Pulmonary HTN, with elevated PA pk pressure lasix was increased to 40 mg daily and she is feeling better, she is on Avapro 300 daily and lasix 40 daily.  She will have BMet today.  2. Bradycardia on holter will review with Dr. Tamala Julian and need to decrease atenolol, unless he is concerned about tachy brady syndrome.  After decreased will have her come back for BP check.  3. Permanent a fib with dilated Lt atrium on echo.  Rate controlled to slow.  Chads2Vasc of 2 on coumadin followed by Dr. Marlou Sa.    4. Dyspnea has improved with increasing the lasix and no change in wt.    5. Chronic diastolic HF improved from last visit.      6. HTN controlled today though has not yet had meds.   We will have her follow up depending on BB.    Current medicines are reviewed with the patient today.  The patient Has no concerns regarding medicines.  The following changes have been made:  See above Labs/ tests ordered today include:see above  Disposition:   FU:  see above  Signed, Cecilie Kicks, NP  04/14/2017 9:59 AM    [facility]  [address] [place], [place], [place] Phone: [phone]; Fax: [phone]

## 2017-04-14 ENCOUNTER — Encounter: Payer: Self-pay | Admitting: Cardiology

## 2017-04-14 ENCOUNTER — Other Ambulatory Visit: Payer: Medicare Other | Admitting: *Deleted

## 2017-04-14 ENCOUNTER — Ambulatory Visit (INDEPENDENT_AMBULATORY_CARE_PROVIDER_SITE_OTHER): Payer: Medicare Other | Admitting: Cardiology

## 2017-04-14 VITALS — BP 150/80 | HR 80 | Ht 64.0 in | Wt 188.5 lb

## 2017-04-14 DIAGNOSIS — I482 Chronic atrial fibrillation: Secondary | ICD-10-CM

## 2017-04-14 DIAGNOSIS — R600 Localized edema: Secondary | ICD-10-CM

## 2017-04-14 DIAGNOSIS — I10 Essential (primary) hypertension: Secondary | ICD-10-CM

## 2017-04-14 DIAGNOSIS — I4821 Permanent atrial fibrillation: Secondary | ICD-10-CM

## 2017-04-14 DIAGNOSIS — I5032 Chronic diastolic (congestive) heart failure: Secondary | ICD-10-CM | POA: Diagnosis not present

## 2017-04-14 DIAGNOSIS — I270 Primary pulmonary hypertension: Secondary | ICD-10-CM | POA: Diagnosis not present

## 2017-04-14 LAB — BASIC METABOLIC PANEL
BUN/Creatinine Ratio: 33 — ABNORMAL HIGH (ref 12–28)
BUN: 51 mg/dL — ABNORMAL HIGH (ref 8–27)
CALCIUM: 9.6 mg/dL (ref 8.7–10.3)
CHLORIDE: 98 mmol/L (ref 96–106)
CO2: 20 mmol/L (ref 18–29)
Creatinine, Ser: 1.55 mg/dL — ABNORMAL HIGH (ref 0.57–1.00)
GFR calc Af Amer: 36 mL/min/{1.73_m2} — ABNORMAL LOW (ref >59)
GFR, EST NON AFRICAN AMERICAN: 31 mL/min/{1.73_m2} — AB (ref >59)
GLUCOSE: 123 mg/dL — AB (ref 65–99)
POTASSIUM: 4.9 mmol/L (ref 3.5–5.2)
SODIUM: 137 mmol/L (ref 134–144)

## 2017-04-14 NOTE — Patient Instructions (Addendum)
Medication Instructions:  None Ordered   Labwork: Your physician recommends that you return for lab work TODAY FOR BMET  Testing/Procedures: None Ordered   Follow-Up: None Ordered   Any Other Special Instructions Will Be Listed Below (If Applicable).     If you need a refill on your cardiac medications before your next appointment, please call your pharmacy.

## 2018-04-07 ENCOUNTER — Ambulatory Visit: Payer: Medicare Other | Admitting: Nurse Practitioner

## 2018-04-16 ENCOUNTER — Other Ambulatory Visit: Payer: Self-pay | Admitting: Interventional Cardiology

## 2018-05-05 ENCOUNTER — Ambulatory Visit
Admission: RE | Admit: 2018-05-05 | Discharge: 2018-05-05 | Disposition: A | Discharge status: Home / Self Care | Payer: Medicare Other | Source: Ambulatory Visit | Attending: Internal Medicine | Admitting: Internal Medicine

## 2018-05-05 ENCOUNTER — Other Ambulatory Visit: Payer: Self-pay | Admitting: Internal Medicine

## 2018-05-05 DIAGNOSIS — M25512 Pain in left shoulder: Secondary | ICD-10-CM

## 2018-06-14 NOTE — Progress Notes (Addendum)
Cardiology Office Note    Date:  06/17/2018   ID:  Jasmine [name], DOB [birth year], MRN [id number]  PCP:  [name], MD  Cardiologist: Sinclair Grooms, MD   Chief Complaint  Patient presents with  . Atrial Fibrillation    History of Present Illness:  Jasmine [name] is a 84 y.o. female  follow-up of atrial fibrillation, CHADS VASC score greater than 2, diastolic heart failure, and hypertension.  Shortness of breath with climbing stairs.  Otherwise no complaints.  Some lower extremity swelling..  No other complaints.  Dr. Kevan Ny is her primary care physician.  Recent labs were drawn by him according to the patient.   Past Medical History:  Diagnosis Date  . Allergic rhinitis due to pollen   . Atrial fibrillation with rapid ventricular response ([place])   . Congestive heart failure, unspecified    patient states she is not aware of this  . Dizziness   . DVT (deep venous thrombosis) (HCC) left leg  . Essential hypertension, malignant   . GERD (gastroesophageal reflux disease)   . Heart murmur   . Insulin resistance    History of insulin resistance.  . Insulin resistance   . Obesity   . Pure hypercholesterolemia   . Vertigo     Past Surgical History:  Procedure Laterality Date  . APPENDECTOMY    . BREAST BIOPSY    . CHOLECYSTECTOMY    . PARTIAL HYSTERECTOMY      Current Medications: Outpatient Medications Prior to Visit  Medication Sig Dispense Refill  . atenolol (TENORMIN) 100 MG tablet Take 100 mg by mouth daily.     . furosemide (LASIX) 40 MG tablet TAKE 1 TABLET BY MOUTH ONCE DAILY 90 tablet 0  . irbesartan (AVAPRO) 300 MG tablet Take 300 mg by mouth daily.     Marland Kitchen levalbuterol (XOPENEX HFA) 45 MCG/ACT inhaler Inhale 1 puff into the lungs as needed for wheezing.    . meclizine (ANTIVERT) 25 MG tablet Take 25 mg by mouth 3 (three) times daily as needed.  1  . spironolactone (ALDACTONE) 25 MG tablet Take 25 mg by mouth daily.     Marland Kitchen warfarin (COUMADIN) 1 MG  tablet Take 1.5 mg by mouth daily. EXCEPT on MONDAYS, WEDNESDAYS, and FRIDAYS - Take two (2) tablets (2 mg total) by mouth daily.     No facility-administered medications prior to visit.      Allergies:   Codeine; Lovenox [enoxaparin sodium]; Norvasc [amlodipine besylate]; and Sulfa antibiotics   Social History   Socioeconomic History  . Marital status: Single    Spouse name: Not on file  . Number of children: Not on file  . Years of education: Not on file  . Highest education level: Not on file  Occupational History  . Not on file  Social Needs  . Financial resource strain: Not on file  . Food insecurity:    Worry: Not on file    Inability: Not on file  . Transportation needs:    Medical: Not on file    Non-medical: Not on file  Tobacco Use  . Smoking status: Never Smoker  . Smokeless tobacco: Never Used  Substance and Sexual Activity  . Alcohol use: No  . Drug use: No  . Sexual activity: Not Currently    Birth control/protection: Post-menopausal  Lifestyle  . Physical activity:    Days per week: Not on file    Minutes per session: Not on file  .  Stress: Not on file  Relationships  . Social connections:    Talks on phone: Not on file    Gets together: Not on file    Attends religious service: Not on file    Active member of club or organization: Not on file    Attends meetings of clubs or organizations: Not on file    Relationship status: Not on file  Other Topics Concern  . Not on file  Social History Narrative  . Not on file     Family History:  The patient's family history includes Diabetes in her mother; Hypertension in her sister; Pulmonary embolism in her sister.   ROS:   Please see the history of present illness.    Shortness of breath, irregular heartbeat, dizziness, and easy bruising.  Frequent urination and recent laboratory of urine suggested microscopic hematuria.  This is being looked into by Dr. Marlou Sa. All other systems reviewed and are  negative.   PHYSICAL EXAM:   VS:  BP 138/80   Pulse 90    GEN: Well nourished, well developed, in no acute distress  HEENT: normal  Neck: no JVD, carotid bruits, or masses Cardiac: IIRR; no murmurs, rubs, or gallops,no edema  Respiratory:  clear to auscultation bilaterally, normal work of breathing GI: soft, nontender, nondistended, + BS MS: no deformity or atrophy  Skin: warm and dry, no rash Neuro:  Alert and Oriented x 3, Strength and sensation are intact Psych: euthymic mood, full affect  Wt Readings from Last 3 Encounters:  04/14/17 188 lb 8 oz (85.5 kg)  03/18/17 188 lb (85.3 kg)  11/28/16 190 lb 11.2 oz (86.5 kg)      Studies/Labs Reviewed:   EKG:  EKG atrial fibrillation with controlled ventricular response at 90 bpm.  No changes noted when compared to the prior tracing of April 2018.  Occasional [place] beat.  Recent Labs: 06/15/2018: ALT 14; BUN 35; Creatinine, Ser 1.51; NT-Pro BNP 1,850; Potassium 4.5; Sodium 136   Lipid Panel No results found for: CHOL, TRIG, HDL, CHOLHDL, VLDL, LDLCALC, LDLDIRECT  Additional studies/ records that were reviewed today include:  24-hour Holter monitor performed 17th 2018: Study Highlights      Continuous atrial fibrillation  No pause > 3.0 sec  Occasional PVC's   Continuous atrial fibrillation with controlled rated Occasional PVC's     Minimum HR: 38 BPM at 6:46:48 AM Maximum HR: 114 BPM at 3:39:35 PM Average HR: 69 BPM        ASSESSMENT:    1. Permanent atrial fibrillation ([place])   2. Chronic diastolic HF (heart failure) ([place])   3. Essential hypertension   4. Long term current use of anticoagulant therapy   5. SOB (shortness of breath)      PLAN:  In order of problems listed above:  1. Is relatively fast.  We may need to further increase beta-blocker.  Will check BMP to be certain that no evidence of volume overload/diastolic heart failure is present.  If elevated will further increase beta-blocker  dose. 2. Basic metabolic panel and BNP will be obtained to assess for electrolyte disturbance given medication regimen 3. Excellent blood pressure control.  Target 130/80 mmHg.. 4. No bleeding complications. 5. BNP will be done to rule out CHF.   Overall seems to be doing well.  No specific complaints.    Medication Adjustments/Labs and Tests Ordered: Current medicines are reviewed at length with the patient today.  Concerns regarding medicines are outlined above.  Medication changes, Labs  and Tests ordered today are listed in the Patient Instructions below. Patient Instructions  Medication Instructions:  Your physician recommends that you continue on your current medications as directed. Please refer to the Current Medication list given to you today.  Labwork: BMET, Liver, Pro BNP today  Testing/Procedures: None  Follow-Up: Your physician wants you to follow-up in: 1 year with Dr. Tamala Julian.  You will receive a reminder letter in the mail two months in advance. If you don't receive a letter, please call our office to schedule the follow-up appointment.   Any Other Special Instructions Will Be Listed Below (If Applicable).     If you need a refill on your cardiac medications before your next appointment, please call your pharmacy.      Signed, Sinclair Grooms, MD  06/17/2018 6:00 PM    [facility] [address], [place], [place]  [address] Phone: [phone]; Fax: [phone]

## 2018-06-15 ENCOUNTER — Ambulatory Visit: Payer: Medicare Other | Admitting: Interventional Cardiology

## 2018-06-15 ENCOUNTER — Encounter: Payer: Self-pay | Admitting: Interventional Cardiology

## 2018-06-15 VITALS — BP 138/80 | HR 90

## 2018-06-15 DIAGNOSIS — I5032 Chronic diastolic (congestive) heart failure: Secondary | ICD-10-CM

## 2018-06-15 DIAGNOSIS — Z7901 Long term (current) use of anticoagulants: Secondary | ICD-10-CM

## 2018-06-15 DIAGNOSIS — I482 Chronic atrial fibrillation: Secondary | ICD-10-CM | POA: Diagnosis not present

## 2018-06-15 DIAGNOSIS — R0602 Shortness of breath: Secondary | ICD-10-CM | POA: Diagnosis not present

## 2018-06-15 DIAGNOSIS — I10 Essential (primary) hypertension: Secondary | ICD-10-CM

## 2018-06-15 DIAGNOSIS — I4821 Permanent atrial fibrillation: Secondary | ICD-10-CM

## 2018-06-15 LAB — HEPATIC FUNCTION PANEL
ALT: 14 IU/L (ref 0–32)
AST: 19 IU/L (ref 0–40)
Albumin: 4.1 g/dL (ref 3.5–4.7)
Alkaline Phosphatase: 165 IU/L — ABNORMAL HIGH (ref 39–117)
Bilirubin Total: 1 mg/dL (ref 0.0–1.2)
Bilirubin, Direct: 0.29 mg/dL (ref 0.00–0.40)
Total Protein: 7.5 g/dL (ref 6.0–8.5)

## 2018-06-15 LAB — BASIC METABOLIC PANEL
BUN/Creatinine Ratio: 23 (ref 12–28)
BUN: 35 mg/dL — ABNORMAL HIGH (ref 8–27)
CO2: 19 mmol/L — ABNORMAL LOW (ref 20–29)
Calcium: 9.7 mg/dL (ref 8.7–10.3)
Chloride: 100 mmol/L (ref 96–106)
Creatinine, Ser: 1.51 mg/dL — ABNORMAL HIGH (ref 0.57–1.00)
GFR calc Af Amer: 37 mL/min/{1.73_m2} — ABNORMAL LOW (ref >59)
GFR calc non Af Amer: 32 mL/min/{1.73_m2} — ABNORMAL LOW (ref >59)
Glucose: 119 mg/dL — ABNORMAL HIGH (ref 65–99)
Potassium: 4.5 mmol/L (ref 3.5–5.2)
Sodium: 136 mmol/L (ref 134–144)

## 2018-06-15 LAB — PRO B NATRIURETIC PEPTIDE: NT-Pro BNP: 1850 pg/mL — ABNORMAL HIGH (ref 0–738)

## 2018-06-15 NOTE — Patient Instructions (Signed)
Medication Instructions:  Your physician recommends that you continue on your current medications as directed. Please refer to the Current Medication list given to you today.  Labwork: BMET, Liver, Pro BNP today  Testing/Procedures: None  Follow-Up: Your physician wants you to follow-up in: 1 year with Dr. Tamala Julian.  You will receive a reminder letter in the mail two months in advance. If you don't receive a letter, please call our office to schedule the follow-up appointment.   Any Other Special Instructions Will Be Listed Below (If Applicable).     If you need a refill on your cardiac medications before your next appointment, please call your pharmacy.

## 2018-06-16 ENCOUNTER — Telehealth: Payer: Self-pay

## 2018-06-16 NOTE — Addendum Note (Signed)
Addended by: Gar Ponto on: 06/16/2018 04:51 PM   Modules accepted: Orders

## 2018-06-16 NOTE — Telephone Encounter (Signed)
Pt had a was seen by Dr. Tamala Julian on 06-15-18 an EKG was done but not ordered.

## 2018-06-17 NOTE — Addendum Note (Signed)
Addended by: Loren Racer on: 06/17/2018 07:43 AM   Modules accepted: Orders

## 2018-06-24 ENCOUNTER — Telehealth: Payer: Self-pay | Admitting: Interventional Cardiology

## 2018-06-24 NOTE — Telephone Encounter (Signed)
Spoke with pt and she states from 7/6-7/13 weight was 175lbs. 7/14 she went up to 176lbs, 7/15 175lbs, 7/16 176lbs and then 7/17 175lbs  She states that her swelling has gone down and she can tell that she has lost fluid. She feels great.  Denies SOB and states she feels much better.  Denies increased salt in diet.  Advised pt to continue current medications and I would call back if Dr. Tamala Julian had further recommendations.

## 2018-06-24 NOTE — Telephone Encounter (Signed)
Thanks

## 2018-06-24 NOTE — Telephone Encounter (Signed)
New message   Patient to report she is doing well Denies SOB No swelling since medication change

## 2018-07-20 ENCOUNTER — Other Ambulatory Visit: Payer: Self-pay | Admitting: Interventional Cardiology

## 2018-10-02 ENCOUNTER — Observation Stay (HOSPITAL_COMMUNITY): Payer: Medicare Other

## 2018-10-02 ENCOUNTER — Observation Stay (HOSPITAL_COMMUNITY)
Admission: EM | Admit: 2018-10-02 | Discharge: 2018-10-03 | Disposition: A | Discharge status: Home / Self Care | Payer: Medicare Other | Attending: Internal Medicine | Admitting: Internal Medicine

## 2018-10-02 ENCOUNTER — Encounter (HOSPITAL_COMMUNITY): Payer: Self-pay | Admitting: Emergency Medicine

## 2018-10-02 ENCOUNTER — Emergency Department (HOSPITAL_COMMUNITY): Payer: Medicare Other

## 2018-10-02 DIAGNOSIS — G459 Transient cerebral ischemic attack, unspecified: Secondary | ICD-10-CM

## 2018-10-02 DIAGNOSIS — R200 Anesthesia of skin: Secondary | ICD-10-CM | POA: Insufficient documentation

## 2018-10-02 DIAGNOSIS — I5032 Chronic diastolic (congestive) heart failure: Secondary | ICD-10-CM | POA: Insufficient documentation

## 2018-10-02 DIAGNOSIS — I825Z2 Chronic embolism and thrombosis of unspecified deep veins of left distal lower extremity: Secondary | ICD-10-CM

## 2018-10-02 DIAGNOSIS — I4891 Unspecified atrial fibrillation: Secondary | ICD-10-CM | POA: Diagnosis present

## 2018-10-02 DIAGNOSIS — I10 Essential (primary) hypertension: Secondary | ICD-10-CM | POA: Diagnosis not present

## 2018-10-02 DIAGNOSIS — I4821 Permanent atrial fibrillation: Secondary | ICD-10-CM

## 2018-10-02 DIAGNOSIS — I5033 Acute on chronic diastolic (congestive) heart failure: Secondary | ICD-10-CM | POA: Diagnosis present

## 2018-10-02 DIAGNOSIS — Z79899 Other long term (current) drug therapy: Secondary | ICD-10-CM | POA: Insufficient documentation

## 2018-10-02 DIAGNOSIS — Z8679 Personal history of other diseases of the circulatory system: Secondary | ICD-10-CM

## 2018-10-02 DIAGNOSIS — Z7901 Long term (current) use of anticoagulants: Secondary | ICD-10-CM

## 2018-10-02 DIAGNOSIS — I82409 Acute embolism and thrombosis of unspecified deep veins of unspecified lower extremity: Secondary | ICD-10-CM | POA: Diagnosis not present

## 2018-10-02 HISTORY — DX: Transient cerebral ischemic attack, unspecified: G45.9

## 2018-10-02 LAB — COMPREHENSIVE METABOLIC PANEL
ALK PHOS: 93 U/L (ref 38–126)
ALT: 14 U/L (ref 0–44)
ANION GAP: 13 (ref 5–15)
AST: 27 U/L (ref 15–41)
Albumin: 3.2 g/dL — ABNORMAL LOW (ref 3.5–5.0)
BILIRUBIN TOTAL: 1.5 mg/dL — AB (ref 0.3–1.2)
BUN: 41 mg/dL — ABNORMAL HIGH (ref 8–23)
CO2: 20 mmol/L — AB (ref 22–32)
Calcium: 9.1 mg/dL (ref 8.9–10.3)
Chloride: 100 mmol/L (ref 98–111)
Creatinine, Ser: 1.58 mg/dL — ABNORMAL HIGH (ref 0.44–1.00)
GFR, EST AFRICAN AMERICAN: 34 mL/min — AB (ref >60)
GFR, EST NON AFRICAN AMERICAN: 29 mL/min — AB (ref >60)
Glucose, Bld: 222 mg/dL — ABNORMAL HIGH (ref 70–99)
Potassium: 3.6 mmol/L (ref 3.5–5.1)
SODIUM: 133 mmol/L — AB (ref 135–145)
TOTAL PROTEIN: 6.7 g/dL (ref 6.5–8.1)

## 2018-10-02 LAB — CBC WITH DIFFERENTIAL/PLATELET
Abs Immature Granulocytes: 0.13 10*3/uL — ABNORMAL HIGH (ref 0.00–0.07)
BASOS ABS: 0 10*3/uL (ref 0.0–0.1)
Basophils Relative: 0 %
EOS ABS: 0 10*3/uL (ref 0.0–0.5)
Eosinophils Relative: 0 %
HEMATOCRIT: 33.8 % — AB (ref 36.0–46.0)
HEMOGLOBIN: 10.5 g/dL — AB (ref 12.0–15.0)
IMMATURE GRANULOCYTES: 1 %
LYMPHS ABS: 0.7 10*3/uL (ref 0.7–4.0)
LYMPHS PCT: 7 %
MCH: 30 pg (ref 26.0–34.0)
MCHC: 31.1 g/dL (ref 30.0–36.0)
MCV: 96.6 fL (ref 80.0–100.0)
Monocytes Absolute: 0.4 10*3/uL (ref 0.1–1.0)
Monocytes Relative: 4 %
NEUTROS ABS: 8.4 10*3/uL — AB (ref 1.7–7.7)
NEUTROS PCT: 88 %
NRBC: 0 % (ref 0.0–0.2)
Platelets: 181 10*3/uL (ref 150–400)
RBC: 3.5 MIL/uL — ABNORMAL LOW (ref 3.87–5.11)
RDW: 15.1 % (ref 11.5–15.5)
WBC: 9.6 10*3/uL (ref 4.0–10.5)

## 2018-10-02 LAB — PROTIME-INR
INR: 1.64
PROTHROMBIN TIME: 19.2 s — AB (ref 11.4–15.2)

## 2018-10-02 MED ORDER — ACETAMINOPHEN 160 MG/5ML PO SOLN: 650.0000 mg | ORAL | Status: DC | PRN

## 2018-10-02 MED ORDER — COLCHICINE 0.6 MG PO TABS
0.6000 mg | ORAL_TABLET | Freq: Every day | ORAL | Status: DC
Start: 2018-10-02 — End: 2018-10-04
  Administered 2018-10-03: 0.6 mg via ORAL
  Filled 2018-10-02: qty 1

## 2018-10-02 MED ORDER — FUROSEMIDE 20 MG PO TABS
40.0000 mg | ORAL_TABLET | Freq: Every day | ORAL | Status: DC
  Administered 2018-10-03: 40 mg via ORAL
  Filled 2018-10-02: qty 2

## 2018-10-02 MED ORDER — METHOCARBAMOL 500 MG PO TABS
500.0000 mg | ORAL_TABLET | Freq: Once | ORAL | Status: AC
  Administered 2018-10-02: 500 mg via ORAL
  Filled 2018-10-02: qty 1

## 2018-10-02 MED ORDER — HYDRALAZINE HCL 20 MG/ML IJ SOLN: 5.0000 mg | Freq: Four times a day (QID) | INTRAMUSCULAR | Status: DC | PRN

## 2018-10-02 MED ORDER — GADOBUTROL 1 MMOL/ML IV SOLN
8.0000 mL | Freq: Once | INTRAVENOUS | Status: AC | PRN
  Administered 2018-10-02: 8 mL via INTRAVENOUS

## 2018-10-02 MED ORDER — ACETAMINOPHEN 650 MG RE SUPP: 650.0000 mg | RECTAL | Status: DC | PRN

## 2018-10-02 MED ORDER — ACETAMINOPHEN 325 MG PO TABS: 650.0000 mg | ORAL_TABLET | ORAL | Status: DC | PRN

## 2018-10-02 MED ORDER — GABAPENTIN 300 MG PO CAPS
300.0000 mg | ORAL_CAPSULE | Freq: Once | ORAL | Status: AC
  Administered 2018-10-02: 300 mg via ORAL
  Filled 2018-10-02: qty 1

## 2018-10-02 MED ORDER — STROKE: EARLY STAGES OF RECOVERY BOOK
Freq: Once | Status: DC
  Filled 2018-10-02: qty 1

## 2018-10-02 MED ORDER — ASPIRIN 81 MG PO CHEW
324.0000 mg | CHEWABLE_TABLET | Freq: Once | ORAL | Status: DC
  Filled 2018-10-02: qty 4

## 2018-10-02 NOTE — Consult Note (Addendum)
[place]   Requesting Physician: Dr. Reather Converse   Chief Complaint:  Right leg numbness, weakness  History obtained from:  Patient    HPI:                                                                                                                                         Jasmine [name] is an 84 y.o. female  With PMH of A. Fib ( on coumadin), CHF, HTN, DVT who presented to [facility] for right leg numbness and weakness.    Patient woke up this morning in her usual state of health. About 1230 pm today she noticed some right leg numbness and weakness. Per patient the weakness has gotten better. Patient lives at home with her twin sister. Still drives at baseline and is able to feed and dress herself.  denies HA, vision changes, facial droop or slurred speech. No prior stroke history noted. Denies smoking, ETOH, or drug use.  Per patient on 09/17/18 her physician stopped her coumadin and it was re-started on 09/28/18.  ED course:  CT head: no hemorrhage BP:162/86 BG: pending   Date last known well: Date: 10/02/2018 Time last known well: Time: 12:30 tPA Given: No: contraindicated patient on coumadin  Modified Rankin: Rankin Score=0 NIHSS:1; for sensory     Past Medical History:  Diagnosis Date  . Allergic rhinitis due to pollen   . Atrial fibrillation with rapid ventricular response ([place])   . Congestive heart failure, unspecified    patient states she is not aware of this  . Dizziness   . DVT (deep venous thrombosis) (HCC) left leg  . Essential hypertension, malignant   . GERD (gastroesophageal reflux disease)   . Heart murmur   . Insulin resistance    History of insulin resistance.  . Insulin resistance   . Obesity   . Pure hypercholesterolemia   . Vertigo     Past Surgical History:  Procedure Laterality Date  . APPENDECTOMY    . BREAST BIOPSY    . CHOLECYSTECTOMY    . PARTIAL HYSTERECTOMY      Family  History  Problem Relation Age of Onset  . Diabetes Mother   . Hypertension Sister   . Pulmonary embolism Sister          Social History:  reports that she has never smoked. She has never used smokeless tobacco. She reports that she does not drink alcohol or use drugs.  Allergies:  Allergies  Allergen Reactions  . Codeine   . Lovenox [Enoxaparin Sodium]   . Norvasc [Amlodipine Besylate]   . Sulfa Antibiotics  Medications:                                                                                                                           No current facility-administered medications for this encounter.    Current Outpatient Medications  Medication Sig Dispense Refill  . atenolol (TENORMIN) 100 MG tablet Take 100 mg by mouth daily.     . furosemide (LASIX) 40 MG tablet TAKE 1 TABLET BY MOUTH ONCE DAILY 90 tablet 3  . irbesartan (AVAPRO) 300 MG tablet Take 300 mg by mouth daily.     Marland Kitchen levalbuterol (XOPENEX HFA) 45 MCG/ACT inhaler Inhale 1 puff into the lungs as needed for wheezing.    . meclizine (ANTIVERT) 25 MG tablet Take 25 mg by mouth 3 (three) times daily as needed.  1  . spironolactone (ALDACTONE) 25 MG tablet Take 25 mg by mouth daily.     Marland Kitchen warfarin (COUMADIN) 1 MG tablet Take 1.5 mg by mouth daily. EXCEPT on MONDAYS, WEDNESDAYS, and FRIDAYS - Take two (2) tablets (2 mg total) by mouth daily.       ROS:                                                                                                                                       ROS was performed and is negative except as noted in HPI    General Examination:                                                                                                      Blood pressure (!) 162/86, pulse 90, temperature 97.6 F (36.4 C), temperature source Oral, resp. rate 17, SpO2 98 %.  HEENT-  Normocephalic, no lesions, without obvious abnormality.  Normal external eye and conjunctiva.  Cardiovascular- S1-S2 audible,  pulses palpable throughout   Lungs-no rhonchi or wheezing noted, no excessive working breathing.  Saturations within normal limits on RA Abdomen- All 4 quadrants palpated  and nontender Extremities- Warm, dry and intact Musculoskeletal- bilateral leg swelling Skin-warm and dry, no hyperpigmentation, vitiligo, or suspicious lesions  Neurological Examination Mental Status: Alert, oriented, thought content appropriate.  Speech fluent without evidence of aphasia.  Able to follow  commands without difficulty. 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 Deep Tendon Reflexes: 2+ and symmetric biceps and patella Plantars: Right: downgoing   Left: downgoing Cerebellar: normal finger-to-nose, normal rapid alternating movements, unable to perform HTS Gait: deferred   Lab Results:  CBC: Recent Labs  Lab 10/02/18 1451  WBC 9.6  NEUTROABS 8.4*  HGB 10.5*  HCT 33.8*  MCV 96.6  PLT 181    Imaging: Ct Head Wo Contrast  Result Date: 10/02/2018 CLINICAL DATA:  84 year old female with right leg weakness and numbness beginning at 1230 hours. EXAM: CT HEAD WITHOUT CONTRAST TECHNIQUE: Contiguous axial images were obtained from the base of the skull through the vertex without intravenous contrast. COMPARISON:  Brain MRI 12/23/2011.  Head CT 06/20/2007. FINDINGS: Brain: Cerebral volume is within normal limits for age. No  midline shift, ventriculomegaly, mass effect, evidence of mass lesion, intracranial hemorrhage or evidence of cortically based acute infarction. Normal for age gray-white matter differentiation throughout the brain. No cortical encephalomalacia identified. Vascular: Calcified atherosclerosis at the skull base. No suspicious intracranial vascular hyperdensity. Dominant left vertebral artery. Skull: Chronic 12 millimeter dural calcification along the left vertex is unchanged since 2008. No acute osseous abnormality identified. Sinuses/Orbits: Visualized paranasal sinuses and mastoids are stable since 2008 and well pneumatized. Other: Visualized orbits and scalp soft tissues are within normal limits. IMPRESSION: Normal for age noncontrast CT appearance of the brain. Electronically Signed   By: Genevie Ann M.D.   On: 10/02/2018 15:17      Laurey Morale, MSN, NP-C [facility] [phone]   10/02/2018, 3:40 PM   Attending physician note to follow with Assessment and plan .   Assessment: 84 y.o. female  Jasmine [name] is an 84 y.o. female  With PMH of A. Fib ( on coumadin), CHF, HTN, DVT who presented to [facility] for right leg numbness and weakness. CT head:  No hemorrhage. MRI   Impression:  TIA Stroke Risk Factors - atrial fibrillation and hypertension    Recommendations: --MRI Brain If negative; then she can be discharged home.     --please page stroke NP  Or  PA  Or MD from 8am -4 pm  as this patient from this time will be  followed by the stroke.   You can look them up on [email or web address]  Password [email or web address]   NEUROHOSPITALIST ADDENDUM Performed a face to face diagnostic evaluation.   I have reviewed the contents of history and physical exam as documented by PA/ARNP/Resident and agree with above documentation.  I have discussed and formulated the above plan as documented. Edits to the note have been made as needed.  Patient is 84 year old female with atrial fibrillation  subtherapeutic  on warfarin.  Presents with numbness in her right leg.  Recommend MRI brain.  If negative patient can be discharged home to continue Warfarin.    Karena Addison Aroor MD [facility] [phone]   If 7pm to 7am, please call on call as listed on AMION.

## 2018-10-02 NOTE — ED Triage Notes (Signed)
Pt arrives via EMS with reports lower Right leg numbness and weakness that started at 12:30 today. Baseline walks with walker.

## 2018-10-02 NOTE — ED Notes (Signed)
Attempted report 

## 2018-10-02 NOTE — H&P (Signed)
TRH H&P   Patient Demographics:    Jasmine [name], is a 84 y.o. female  MRN: [id number]   DOB - 01/29/1934  Admit Date - 10/02/2018  Outpatient Primary MD for the patient is [name], MD  Referring MD/NP/PA: Dr Reather Converse  Outpatient Specialists: Cards Dr Tamala Julian   Patient coming from: Home  Chief Complaint  Patient presents with  . Numbness      HPI:    Jasmine [name]  is a 84 y.o. female, with past medical history of A. fib, on warfarin, CHF, hypertension, lower extremity diagnosed thousand 13, presents to ED with complaints of right leg tingling and numbness, patient reports she woke up this morning in her usual state of health, reports around 12:30 PM she had some right leg and hand tingling and numbness, denies any headache, visual changes, facial droop, slurred speech, or actual weakness, smoking, alcohol use, still reports her tingling and numbness persists, but she is compliant with her antihypertensive medication, has 2 new medication started by PCP, reports her warfarin has been stopped from [place] till October/21, as well she was recently started on colchicine for gout. In ED CT head with no acute findings, pressure was elevated 163/68, baseline creatinine around 1.5, INR subtherapeutic at 1.6, she did receive full dose aspirin in ED, seen by neurology, and we were consulted to admit    Review of systems:    In addition to the HPI above,  No Fever-chills, No Headache, No changes with Vision or hearing, No problems swallowing food or Liquids, No Chest pain, Cough or Shortness of Breath, No Abdominal pain, No Nausea or Vommitting, Bowel movements are regular, No Blood in stool or Urine, No dysuria, No new skin rashes or bruises, No new joints pains-aches,  No new weakness, does report right side tingling and numbness No recent weight gain or loss, No polyuria,  polydypsia or polyphagia, No significant Mental Stressors.  A full 10 point Review of Systems was done, except as stated above, all other Review of Systems were negative.   With Past History of the following :    Past Medical History:  Diagnosis Date  . Allergic rhinitis due to pollen   . Atrial fibrillation with rapid ventricular response ([place])   . Congestive heart failure, unspecified    patient states she is not aware of this  . Dizziness   . DVT (deep venous thrombosis) (HCC) left leg  . Essential hypertension, malignant   . GERD (gastroesophageal reflux disease)   . Heart murmur   . Insulin resistance    History of insulin resistance.  . Insulin resistance   . Obesity   . Pure hypercholesterolemia   . Vertigo       Past Surgical History:  Procedure Laterality Date  . APPENDECTOMY    . BREAST BIOPSY    . CHOLECYSTECTOMY    . PARTIAL HYSTERECTOMY  Social History:     Social History   Tobacco Use  . Smoking status: Never Smoker  . Smokeless tobacco: Never Used  Substance Use Topics  . Alcohol use: No     Lives -at home  Mobility -independent     Family History :     Family History  Problem Relation Age of Onset  . Diabetes Mother   . Hypertension Sister   . Pulmonary embolism Sister       Home Medications:   Prior to Admission medications   Medication Sig Start Date End Date Taking? Authorizing Provider  atenolol (TENORMIN) 100 MG tablet Take 100 mg by mouth daily.     [provider]  furosemide (LASIX) 40 MG tablet TAKE 1 TABLET BY MOUTH ONCE DAILY 07/20/18   Belva Crome, MD  irbesartan (AVAPRO) 300 MG tablet Take 300 mg by mouth daily.     [provider]  levalbuterol (XOPENEX HFA) 45 MCG/ACT inhaler Inhale 1 puff into the lungs as needed for wheezing.    [provider]  meclizine (ANTIVERT) 25 MG tablet Take 25 mg by mouth 3 (three) times daily as needed. 02/14/15   [provider]    spironolactone (ALDACTONE) 25 MG tablet Take 25 mg by mouth daily.  11/15/16   [provider]  warfarin (COUMADIN) 1 MG tablet Take 1.5 mg by mouth daily. EXCEPT on MONDAYS, WEDNESDAYS, and FRIDAYS - Take two (2) tablets (2 mg total) by mouth daily.    [provider]     Allergies:     Allergies  Allergen Reactions  . Codeine   . Lovenox [Enoxaparin Sodium]   . Norvasc [Amlodipine Besylate]   . Sulfa Antibiotics      Physical Exam:   Vitals  Blood pressure (!) 165/87, pulse (!) 101, temperature 97.6 F (36.4 C), temperature source Oral, resp. rate (!) 22, SpO2 98 %.   1. General developed female laying in bed in no apparent distress  2. Normal affect and insight, Not Suicidal or Homicidal, Awake Alert, Oriented X 3.  3. No F.N deficits, ALL C.Nerves Intact, (she has a chronic right eyelid droop )strength 5/5 all 4 extremities, Sensation intact all 4 extremities, Plantars down going.  4. Ears and Eyes appear Normal, Conjunctivae clear, PERRLA. Moist Oral Mucosa.  5. Supple Neck, No JVD, No cervical lymphadenopathy appriciated, No Carotid Bruits.  6. Symmetrical Chest wall movement, Good air movement bilaterally, CTAB.  7. RRR, No Gallops, Rubs or Murmurs, No Parasternal Heave.  8. Positive Bowel Sounds, Abdomen Soft, No tenderness, No organomegaly appriciated,No rebound -guarding or rigidity.  9.  No Cyanosis, Normal Skin Turgor, No Skin Rash or Bruise.  10. Good muscle tone,  joints appear normal , no effusions, Normal ROM.  +1 edema bilaterally(baseline per patient_  11. No Palpable Lymph Nodes in Neck or Axillae    Data Review:    CBC Recent Labs  Lab 10/02/18 1451  WBC 9.6  HGB 10.5*  HCT 33.8*  PLT 181  MCV 96.6  MCH 30.0  MCHC 31.1  RDW 15.1  LYMPHSABS 0.7  MONOABS 0.4  EOSABS 0.0  BASOSABS 0.0   ------------------------------------------------------------------------------------------------------------------  Chemistries   Recent Labs  Lab 10/02/18 1451  NA 133*  K 3.6  CL 100  CO2 20*  GLUCOSE 222*  BUN 41*  CREATININE 1.58*  CALCIUM 9.1  AST 27  ALT 14  ALKPHOS 93  BILITOT 1.5*   ------------------------------------------------------------------------------------------------------------------ CrCl cannot be calculated (Unknown ideal  weight.). ------------------------------------------------------------------------------------------------------------------ No results for input(s): TSH, T4TOTAL, T3FREE, THYROIDAB in the last 72 hours.  Invalid input(s): FREET3  Coagulation profile Recent Labs  Lab 10/02/18 1451  INR 1.64   ------------------------------------------------------------------------------------------------------------------- No results for input(s): DDIMER in the last 72 hours. -------------------------------------------------------------------------------------------------------------------  Cardiac Enzymes No results for input(s): CKMB, TROPONINI, MYOGLOBIN in the last 168 hours.  Invalid input(s): CK ------------------------------------------------------------------------------------------------------------------ No results found for: BNP   ---------------------------------------------------------------------------------------------------------------  Urinalysis No results found for: COLORURINE, APPEARANCEUR, LABSPEC, PHURINE, GLUCOSEU, HGBUR, BILIRUBINUR, KETONESUR, PROTEINUR, UROBILINOGEN, NITRITE, LEUKOCYTESUR  ----------------------------------------------------------------------------------------------------------------   Imaging Results:    Ct Head Wo Contrast  Result Date: 10/02/2018 CLINICAL DATA:  84 year old female with right leg weakness and numbness beginning at 1230 hours. EXAM: CT HEAD WITHOUT CONTRAST TECHNIQUE: Contiguous axial images were obtained from the base of the skull through the vertex without intravenous contrast. COMPARISON:  Brain MRI  12/23/2011.  Head CT 06/20/2007. FINDINGS: Brain: Cerebral volume is within normal limits for age. No midline shift, ventriculomegaly, mass effect, evidence of mass lesion, intracranial hemorrhage or evidence of cortically based acute infarction. Normal for age gray-white matter differentiation throughout the brain. No cortical encephalomalacia identified. Vascular: Calcified atherosclerosis at the skull base. No suspicious intracranial vascular hyperdensity. Dominant left vertebral artery. Skull: Chronic 12 millimeter dural calcification along the left vertex is unchanged since 2008. No acute osseous abnormality identified. Sinuses/Orbits: Visualized paranasal sinuses and mastoids are stable since 2008 and well pneumatized. Other: Visualized orbits and scalp soft tissues are within normal limits. IMPRESSION: Normal for age noncontrast CT appearance of the brain. Electronically Signed   By: Genevie Ann M.D.   On: 10/02/2018 15:17    My personal review of EKG: Rhythm A fib, Rate  /min, QTc 464 , no Acute ST changes   Assessment & Plan:    Active Problems:   Atrial fibrillation (HCC)   Hypertension   Deep venous thrombosis of lower extremity (HCC)   Chronic diastolic HF (heart failure) ([place])   Long term current use of anticoagulant therapy   TIA (transient ischemic attack)   TIA -Patient presents with right side tingling, numbness, but no focal deficits(has chronic right eye droop), think this is most likely in the setting of subtherapeutic INR, as she was asked her PCP to stop warfarin(likely was elevated) from October 10 until October 21, it is subtherapeutic at 1.6. -Neurology input greatly appreciated, will obtain MRI brain, MRA head/neck, 2D echo, will monitor on telemetry, obtain lipid panel and hemoglobin A1c. -Consult PT/OT/SLP  Hypertension -Hold meds and allowed for permissive hypertension, keep on PRN hydralazine for systolic more than 517  Chronic diastolic CHF -Ports lower extremity  edema at baseline, resume home dose diuresis  Lower extremity chronic DVT -Reported chronic, diagnosed 6 years ago, she is currently on warfarin with subtherapeutic dose, I will consult pharmacy to dose, please see discussion below about anticoagulation.  A. Fib -Heart rate controlled, she is on atenolol which I will hold to allow for permissive hypertension. -Is on warfarin for anticoagulation, she is subtherapeutic with INR 1.6, I have consulted pharmacy to continue dosing for tonight, likely she will need to be transitioned to Eliquis or Xarelto if her imaging is negative for acute CVA, will await stroke team recommendation regarding this matter.  CKD stage III -Baseline, continue to monitor  Gout -Continue with colchicine  DVT Prophylaxis warfarin  AM Labs Ordered, also please review Full Orders  Family Communication: Admission, patients condition and plan of care including tests being ordered have been discussed  with the patient and her sister, and her brother who indicate understanding and agree with the plan and Code Status.  Code Status   Likely DC to  : pending PT  Condition GUARDED    Consults called: Neuro  Admission status: Obs  Time spent in minutes : 60 minutes   Phillips Climes M.D on 10/02/2018 at 5:13 PM  Between 7am to 7pm - Pager - [phone]. After 7pm go to [email or web address] - password [facility]  [facility] - Office  [phone]

## 2018-10-02 NOTE — ED Provider Notes (Signed)
[facility] EMERGENCY DEPARTMENT Provider Note   CSN: [id number] Arrival date & time: 10/02/18  1345     History   Chief Complaint Chief Complaint  Patient presents with  . Numbness    HPI Jasmine [name] is a 84 y.o. female.  Patient with high blood pressure, reflux, atrial fibrillation recently taken off Coumadin for unknown reason per patient, heart failure, DVT, restarted Coumadin 1 mg on 21 October presents with right leg numbness that started 12:30 PM today.  Patient denies history of stroke or similar symptoms.  Patient feels numb and possibly mild weakness in the right leg.  No concerning headaches no fevers or chills.  No head injuries.     Past Medical History:  Diagnosis Date  . Allergic rhinitis due to pollen   . Atrial fibrillation with rapid ventricular response ([place])   . Congestive heart failure, unspecified    patient states she is not aware of this  . Dizziness   . DVT (deep venous thrombosis) ([facility]) left leg  . Essential hypertension, malignant   . GERD (gastroesophageal reflux disease)   . Heart murmur   . Insulin resistance    History of insulin resistance.  . Insulin resistance   . Obesity   . Pure hypercholesterolemia   . Vertigo     Patient Active Problem List   Diagnosis Date Noted  . Long term current use of anticoagulant therapy 02/08/2014  . Chronic diastolic HF (heart failure) ([place]) 11/21/2012  . Insulin resistance 11/21/2012  . Deep venous thrombosis of lower extremity ([place]) 06/02/2012  . Hypertension 01/07/2012  . Atrial fibrillation ([place]) 12/23/2011    Past Surgical History:  Procedure Laterality Date  . APPENDECTOMY    . BREAST BIOPSY    . CHOLECYSTECTOMY    . PARTIAL HYSTERECTOMY       OB History   None      Home Medications    Prior to Admission medications   Medication Sig Start Date End Date Taking? Authorizing Provider  atenolol (TENORMIN) 100 MG tablet Take 100 mg by mouth daily.      [provider]  furosemide (LASIX) 40 MG tablet TAKE 1 TABLET BY MOUTH ONCE DAILY 07/20/18   Belva Crome, MD  irbesartan (AVAPRO) 300 MG tablet Take 300 mg by mouth daily.     [provider]  levalbuterol (XOPENEX HFA) 45 MCG/ACT inhaler Inhale 1 puff into the lungs as needed for wheezing.    [provider]  meclizine (ANTIVERT) 25 MG tablet Take 25 mg by mouth 3 (three) times daily as needed. 02/14/15   [provider]  spironolactone (ALDACTONE) 25 MG tablet Take 25 mg by mouth daily.  11/15/16   [provider]  warfarin (COUMADIN) 1 MG tablet Take 1.5 mg by mouth daily. EXCEPT on MONDAYS, WEDNESDAYS, and FRIDAYS - Take two (2) tablets (2 mg total) by mouth daily.    [provider]    Family History Family History  Problem Relation Age of Onset  . Diabetes Mother   . Hypertension Sister   . Pulmonary embolism Sister     Social History Social History   Tobacco Use  . Smoking status: Never Smoker  . Smokeless tobacco: Never Used  Substance Use Topics  . Alcohol use: No  . Drug use: No     Allergies   Codeine; Lovenox [enoxaparin sodium]; Norvasc [amlodipine besylate]; and Sulfa antibiotics   Review of Systems Review of Systems  Constitutional: Negative  for chills and fever.  HENT: Negative for congestion.   Eyes: Negative for visual disturbance.  Respiratory: Negative for shortness of breath.   Cardiovascular: Negative for chest pain.  Gastrointestinal: Negative for abdominal pain and vomiting.  Genitourinary: Negative for dysuria and flank pain.  Musculoskeletal: Negative for back pain, neck pain and neck stiffness.  Skin: Negative for rash.  Neurological: Positive for numbness. Negative for syncope, light-headedness and headaches.     Physical Exam Updated Vital Signs BP (!) 162/86   Pulse 90   Temp 97.6 F (36.4 C) (Oral)   Resp 17   SpO2 98%   Physical Exam  Constitutional: She is oriented to  person, place, and time. She appears well-developed and well-nourished.  HENT:  Head: Normocephalic and atraumatic.  Eyes: Conjunctivae are normal. Right eye exhibits no discharge. Left eye exhibits no discharge.  Neck: Normal range of motion. Neck supple. No tracheal deviation present.  Cardiovascular: Normal rate and regular rhythm.  Pulmonary/Chest: Effort normal and breath sounds normal.  Abdominal: Soft. She exhibits no distension. There is no tenderness. There is no guarding.  Musculoskeletal: She exhibits edema (mild bilateral LEs).  Neurological: She is alert and oriented to person, place, and time. GCS eye subscore is 4. GCS verbal subscore is 5. GCS motor subscore is 6.  5+ strength in UE with f/e at major joints. Sensation to palpation intact in UE and LE. CNs 2-12 grossly intact.  EOMFI.  PERRL.   Finger nose and coordination intact bilateral.   No nystagmus Patient requires increased effort to raise the right lower extremity compared to the left.   Skin: Skin is warm. No rash noted.  Psychiatric: She has a normal mood and affect.  Nursing note and vitals reviewed.    ED Treatments / Results  Labs (all labs ordered are listed, but only abnormal results are displayed) Labs Reviewed  CBC WITH DIFFERENTIAL/PLATELET - Abnormal; Notable for the following components:      Result Value   RBC 3.50 (*)    Hemoglobin 10.5 (*)    HCT 33.8 (*)    Neutro Abs 8.4 (*)    Abs Immature Granulocytes 0.13 (*)    All other components within normal limits  COMPREHENSIVE METABOLIC PANEL - Abnormal; Notable for the following components:   Sodium 133 (*)    CO2 20 (*)    Glucose, Bld 222 (*)    BUN 41 (*)    Creatinine, Ser 1.58 (*)    Albumin 3.2 (*)    Total Bilirubin 1.5 (*)    GFR calc non Af Amer 29 (*)    GFR calc Af Amer 34 (*)    All other components within normal limits  PROTIME-INR - Abnormal; Notable for the following components:   Prothrombin Time 19.2 (*)    All other  components within normal limits    EKG None  Radiology Ct Head Wo Contrast  Result Date: 10/02/2018 CLINICAL DATA:  84 year old female with right leg weakness and numbness beginning at 1230 hours. EXAM: CT HEAD WITHOUT CONTRAST TECHNIQUE: Contiguous axial images were obtained from the base of the skull through the vertex without intravenous contrast. COMPARISON:  Brain MRI 12/23/2011.  Head CT 06/20/2007. FINDINGS: Brain: Cerebral volume is within normal limits for age. No midline shift, ventriculomegaly, mass effect, evidence of mass lesion, intracranial hemorrhage or evidence of cortically based acute infarction. Normal for age gray-white matter differentiation throughout the brain. No cortical encephalomalacia identified. Vascular: Calcified atherosclerosis at the skull base.  No suspicious intracranial vascular hyperdensity. Dominant left vertebral artery. Skull: Chronic 12 millimeter dural calcification along the left vertex is unchanged since 2008. No acute osseous abnormality identified. Sinuses/Orbits: Visualized paranasal sinuses and mastoids are stable since 2008 and well pneumatized. Other: Visualized orbits and scalp soft tissues are within normal limits. IMPRESSION: Normal for age noncontrast CT appearance of the brain. Electronically Signed   By: Genevie Ann M.D.   On: 10/02/2018 15:17    Procedures Procedures (including critical care time)  Medications Ordered in ED Medications - No data to display   Initial Impression / Assessment and Plan / ED Course  I have reviewed the triage vital signs and the nursing notes.  Pertinent labs & imaging results that were available during my care of the patient were reviewed by me and considered in my medical decision making (see chart for details).    Patient with atrial fibrillation history recently off Coumadin presents with new onset right leg weakness/numbness since 1230 today.  Patient does not have any significant objective findings on  exam.  Discussed with neurology who will see patient after CT scan of the head.  INR pending.  Neurology evaluated and recommended further stroke work-up.  On recheck patient has no new neurologic deficits.  Discussed observation.  Paged hospitalist for observation/admission. Labs reviewed chronic anemia, chronic renal disease. CT scan of the head no acute bleed no large stroke.  Aspirin ordered.  The patients results and plan were reviewed and discussed.   Any x-rays performed were independently reviewed by myself.   Differential diagnosis were considered with the presenting HPI.  Medications - No data to display  Vitals:   10/02/18 1500 10/02/18 1530 10/02/18 1545 10/02/18 1600  BP: (!) 165/86 (!) 165/74 (!) 148/72 (!) 164/78  Pulse: 84 92 94 81  Resp: 19 (!) 24 19 19   Temp:      TempSrc:      SpO2: 99% 99% 98% 98%    Final diagnoses:  Right leg numbness  History of atrial fibrillation    Admission/ observation were discussed with the admitting physician, patient and/or family and they are comfortable with the plan.    Final Clinical Impressions(s) / ED Diagnoses   Final diagnoses:  Right leg numbness  History of atrial fibrillation    ED Discharge Orders    None       Elnora Morrison, MD 10/02/18 1624

## 2018-10-02 NOTE — ED Notes (Signed)
Patient transported to MRI 

## 2018-10-03 ENCOUNTER — Observation Stay (HOSPITAL_BASED_OUTPATIENT_CLINIC_OR_DEPARTMENT_OTHER): Payer: Medicare Other

## 2018-10-03 DIAGNOSIS — I4891 Unspecified atrial fibrillation: Secondary | ICD-10-CM

## 2018-10-03 DIAGNOSIS — I340 Nonrheumatic mitral (valve) insufficiency: Secondary | ICD-10-CM | POA: Diagnosis not present

## 2018-10-03 DIAGNOSIS — G459 Transient cerebral ischemic attack, unspecified: Secondary | ICD-10-CM

## 2018-10-03 DIAGNOSIS — I361 Nonrheumatic tricuspid (valve) insufficiency: Secondary | ICD-10-CM

## 2018-10-03 DIAGNOSIS — I10 Essential (primary) hypertension: Secondary | ICD-10-CM | POA: Diagnosis not present

## 2018-10-03 LAB — BASIC METABOLIC PANEL
Anion gap: 12 (ref 5–15)
BUN: 31 mg/dL — AB (ref 8–23)
CALCIUM: 9 mg/dL (ref 8.9–10.3)
CO2: 21 mmol/L — ABNORMAL LOW (ref 22–32)
CREATININE: 1.28 mg/dL — AB (ref 0.44–1.00)
Chloride: 104 mmol/L (ref 98–111)
GFR calc non Af Amer: 38 mL/min — ABNORMAL LOW (ref >60)
GFR, EST AFRICAN AMERICAN: 44 mL/min — AB (ref >60)
Glucose, Bld: 88 mg/dL (ref 70–99)
Potassium: 3.2 mmol/L — ABNORMAL LOW (ref 3.5–5.1)
SODIUM: 137 mmol/L (ref 135–145)

## 2018-10-03 LAB — LIPID PANEL
Cholesterol: 95 mg/dL (ref 0–200)
HDL: 26 mg/dL — ABNORMAL LOW (ref >40)
LDL CALC: 60 mg/dL (ref 0–99)
Total CHOL/HDL Ratio: 3.7 RATIO
Triglycerides: 45 mg/dL (ref <150)
VLDL: 9 mg/dL (ref 0–40)

## 2018-10-03 LAB — CBC
HCT: 31.9 % — ABNORMAL LOW (ref 36.0–46.0)
HEMOGLOBIN: 10 g/dL — AB (ref 12.0–15.0)
MCH: 29.9 pg (ref 26.0–34.0)
MCHC: 31.3 g/dL (ref 30.0–36.0)
MCV: 95.5 fL (ref 80.0–100.0)
PLATELETS: 154 10*3/uL (ref 150–400)
RBC: 3.34 MIL/uL — ABNORMAL LOW (ref 3.87–5.11)
RDW: 15 % (ref 11.5–15.5)
WBC: 6.7 10*3/uL (ref 4.0–10.5)
nRBC: 0 % (ref 0.0–0.2)

## 2018-10-03 LAB — ECHOCARDIOGRAM COMPLETE
HEIGHTINCHES: 64 in
WEIGHTICAEL: 2984.15 [oz_av]

## 2018-10-03 LAB — HEMOGLOBIN A1C
Hgb A1c MFr Bld: 6.4 % — ABNORMAL HIGH (ref 4.8–5.6)
Mean Plasma Glucose: 136.98 mg/dL

## 2018-10-03 MED ORDER — POTASSIUM CHLORIDE CRYS ER 10 MEQ PO TBCR
10.0000 meq | EXTENDED_RELEASE_TABLET | Freq: Every day | ORAL | Status: DC
  Administered 2018-10-03: 10 meq via ORAL
  Filled 2018-10-03: qty 1

## 2018-10-03 MED ORDER — ASPIRIN 81 MG PO CHEW: 324.0000 mg | CHEWABLE_TABLET | Freq: Once | ORAL | 0 refills | Status: AC

## 2018-10-03 MED ORDER — WARFARIN SODIUM 2 MG PO TABS
2.0000 mg | ORAL_TABLET | ORAL | Status: AC
  Administered 2018-10-03: 2 mg via ORAL
  Filled 2018-10-03: qty 1

## 2018-10-03 MED ORDER — POTASSIUM CHLORIDE CRYS ER 10 MEQ PO TBCR: 10.0000 meq | EXTENDED_RELEASE_TABLET | Freq: Every day | ORAL | 0 refills | Status: DC

## 2018-10-03 MED ORDER — WARFARIN - PHYSICIAN DOSING INPATIENT: Freq: Every day | Status: DC

## 2018-10-03 NOTE — Evaluation (Signed)
Physical Therapy Evaluation Patient Details Name: Jasmine [name] MRN: [id number] DOB: 07-29-1934 Today's Date: 10/03/2018   History of Present Illness  Pt is a 84 y/o female admitted 10/02/18 with reports of R hand and leg tingling and numbness.  CT and MRI negative.  PMH signficant for but not limited to: afib, CHF, HTN, DVT, vertigo, and per pt report gout.   Clinical Impression  Pt presented supine in bed with HOB elevated, awake and willing to participate in therapy session. Prior to admission, pt reported that she occasionally was using the RW secondary to gout flare up and independent with ADLs. Pt currently very limited secondary to fatigue and pain in bilateral hands. Pt required min A for transfers and min guard to min A for short distance ambulation without use of an AD. Pt will have 24/7 supervision/assistance upon d/c. Pt would continue to benefit from skilled physical therapy services at this time while admitted and after d/c to address the below listed limitations in order to improve overall safety and independence with functional mobility.  Pt's HR fluctuating throughout session (low 80's to 110bpm).     Follow Up Recommendations Home health PT;Supervision/Assistance - 24 hour    Equipment Recommendations  None recommended by PT    Recommendations for Other Services       Precautions / Restrictions Precautions Precautions: Fall Restrictions Weight Bearing Restrictions: No      Mobility  Bed Mobility Overal bed mobility: Needs Assistance Bed Mobility: Supine to Sit     Supine to sit: Min assist     General bed mobility comments: pt sitting EOB upon arrival  Transfers Overall transfer level: Needs assistance Equipment used: None Transfers: Sit to/from Stand Sit to Stand: Min assist         General transfer comment: min A to power into standing and for stability with transitional movement; use of momentum  Ambulation/Gait Ambulation/Gait assistance:  Min guard;Min assist Gait Distance (Feet): 50 Feet Assistive device: None Gait Pattern/deviations: Step-through pattern;Decreased step length - right;Decreased step length - left;Decreased stride length;Shuffle;Antalgic Gait velocity: decreased Gait velocity interpretation: <1.31 ft/sec, indicative of household ambulator General Gait Details: mildly antalgic gait pattern with ambulation; pt also with slow, guarded gait without use of an AD with constant close min guard. pt with one LOB requiring min A to recover.  Stairs            Wheelchair Mobility    Modified Rankin (Stroke Patients Only) Modified Rankin (Stroke Patients Only) Pre-Morbid Rankin Score: Moderate disability Modified Rankin: Moderate disability     Balance Overall balance assessment: Needs assistance Sitting-balance support: Feet supported Sitting balance-Leahy Scale: Fair     Standing balance support: During functional activity;No upper extremity supported Standing balance-Leahy Scale: Poor Standing balance comment: static is fair, dynamic is poor                             Pertinent Vitals/Pain Pain Assessment: Faces Faces Pain Scale: Hurts a little bit Pain Location: bilateral hands Pain Descriptors / Indicators: Discomfort Pain Intervention(s): Monitored during session    Home Living Family/patient expects to be discharged to:: Private residence Living Arrangements: Other relatives(sister) Available Help at Discharge: Family;Available 24 hours/day Type of Home: Apartment Home Access: Stairs to enter Entrance Stairs-Rails: Right Entrance Stairs-Number of Steps: 1 Home Layout: One level Home Equipment: Walker - 2 wheels      Prior Function Level of Independence: Independent with assistive device(s)  Comments: used RW at times, reports increased assist recently due to gout flare  beginning in october      Hand Dominance   Dominant Hand: Right    Extremity/Trunk  Assessment   Upper Extremity Assessment Upper Extremity Assessment: Defer to OT evaluation RUE Deficits / Details: 4/5 grossly MMT proximally, edema B hands with inability to make full flexion pt reports gout flare  RUE Sensation: WNL RUE Coordination: decreased fine motor LUE Deficits / Details: 4/5 grossly MMT proximally, edema B hands with inability to make full flexion pt reports gout flare  LUE Sensation: WNL LUE Coordination: decreased fine motor    Lower Extremity Assessment Lower Extremity Assessment: Generalized weakness    Cervical / Trunk Assessment Cervical / Trunk Assessment: Kyphotic  Communication   Communication: No difficulties  Cognition Arousal/Alertness: Awake/alert Behavior During Therapy: WFL for tasks assessed/performed Overall Cognitive Status: Within Functional Limits for tasks assessed                                        General Comments General comments (skin integrity, edema, etc.): HR increased to 116 after mobility and self care, sister present and supportive    Exercises     Assessment/Plan    PT Assessment Patient needs continued PT services  PT Problem List Decreased strength;Decreased range of motion;Decreased activity tolerance;Decreased balance;Decreased mobility;Decreased coordination;Decreased knowledge of use of DME;Decreased safety awareness;Decreased knowledge of precautions;Pain       PT Treatment Interventions DME instruction;Gait training;Stair training;Functional mobility training;Therapeutic activities;Therapeutic exercise;Balance training;Neuromuscular re-education;Patient/family education    PT Goals (Current goals can be found in the Care Plan section)  Acute Rehab PT Goals Patient Stated Goal: to get home  PT Goal Formulation: With patient Time For Goal Achievement: 10/17/18 Potential to Achieve Goals: Good    Frequency Min 3X/week   Barriers to discharge        Co-evaluation                AM-PAC PT "6 Clicks" Daily Activity  Outcome Measure Difficulty turning over in bed (including adjusting bedclothes, sheets and blankets)?: A Lot Difficulty moving from lying on back to sitting on the side of the bed? : Unable Difficulty sitting down on and standing up from a chair with arms (e.g., wheelchair, bedside commode, etc,.)?: Unable Help needed moving to and from a bed to chair (including a wheelchair)?: A Little Help needed walking in hospital room?: A Little Help needed climbing 3-5 steps with a railing? : A Lot 6 Click Score: 12    End of Session Equipment Utilized During Treatment: Gait belt Activity Tolerance: Patient limited by fatigue Patient left: in bed;with call bell/phone within reach;with family/visitor present;Other (comment)(sitting EOB) Nurse Communication: Mobility status PT Visit Diagnosis: Other abnormalities of gait and mobility (R26.89);Other symptoms and signs involving the nervous system (R29.898)    Time: 5852-7782 PT Time Calculation (min) (ACUTE ONLY): 19 min   Charges:   PT Evaluation $PT Eval Moderate Complexity: 1 Mod          Sherie Don, PT, DPT  Acute Rehabilitation Services Pager [phone] Office [place] 10/03/2018, 9:08 AM

## 2018-10-03 NOTE — Progress Notes (Signed)
STROKE TEAM PROGRESS NOTE   HISTORY OF PRESENT ILLNESS (per record) Jasmine [name] is an 84 y.o. female  With PMH of A. Fib ( on coumadin), CHF, HTN, DVT who presented to [facility] for right leg numbness and weakness.   Patient woke up this morning in her usual state of health. About 1230 pm today she noticed some right leg numbness and weakness. Per patient the weakness has gotten better. Patient lives at home with her twin sister. Still drives at baseline and is able to feed and dress herself.  denies HA, vision changes, facial droop or slurred speech. No prior stroke history noted. Denies smoking, ETOH, or drug use.  Per patient on 09/17/18 her physician stopped her coumadin and it was re-started on 09/28/18.  ED course:  CT head: no hemorrhage BP:162/86 BG: pending   Date last known well: Date: 10/02/2018 Time last known well: Time: 12:30 tPA Given: No: contraindicated patient on coumadin  Modified Rankin: Rankin Score=0 NIHSS:1; for sensory   SUBJECTIVE (INTERVAL HISTORY) Her twin sister is at bedside. She says her leg feels better. She is still having numbness on the bottom of the right foot. She fell on Sept 23rd and rolled her ankle but this does not correlate with incident. She was recently off of her coumadin. She denied back pain. Discussed MRI negative.     OBJECTIVE Vitals:   10/02/18 2254 10/03/18 0002 10/03/18 0300 10/03/18 0730  BP: (!) 157/66 (!) 150/76 (!) 157/78 (!) 145/54  Pulse: (!) 46 (!) 134 74 66  Resp: 18 18 17 17   Temp:   97.8 F (36.6 C) 98.9 F (37.2 C)  TempSrc:   Oral Oral  SpO2: 96% 94% 97% 95%  Weight:  84.6 kg    Height:  5\' 4"  (1.626 m)      CBC:  Recent Labs  Lab 10/02/18 1451 10/03/18 0521  WBC 9.6 6.7  NEUTROABS 8.4*  --   HGB 10.5* 10.0*  HCT 33.8* 31.9*  MCV 96.6 95.5  PLT 181 132    Basic Metabolic Panel:  Recent Labs  Lab 10/02/18 1451 10/03/18 0521  NA 133* 137  K 3.6 3.2*  CL 100 104  CO2 20* 21*  GLUCOSE 222* 88   BUN 41* 31*  CREATININE 1.58* 1.28*  CALCIUM 9.1 9.0    Lipid Panel:     Component Value Date/Time   CHOL 95 10/03/2018 0521   TRIG 45 10/03/2018 0521   HDL 26 (L) 10/03/2018 0521   CHOLHDL 3.7 10/03/2018 0521   VLDL 9 10/03/2018 0521   LDLCALC 60 10/03/2018 0521   HgbA1c:  Lab Results  Component Value Date   HGBA1C 6.4 (H) 10/03/2018   Urine Drug Screen: No results found for: LABOPIA, COCAINSCRNUR, LABBENZ, AMPHETMU, THCU, LABBARB  Alcohol Level No results found for: ETH  IMAGING  Ct Head Wo Contrast 10/02/2018 IMPRESSION:  Normal for age noncontrast CT appearance of the brain.    Mr [facility] Contrast Mr Jodene Nam Neck W Wo Contrast 10/02/2018 IMPRESSION:  1. No acute intracranial abnormality.  2. Minimal chronic white matter disease for age. This may indicate mild chronic small vessel disease.  3. Normal intracranial and cervical MRA.     Transthoracic Echocardiogram - pending 00/00/00     PHYSICAL EXAM Blood pressure (!) 145/54, pulse 66, temperature 98.9 F (37.2 C), temperature source Oral, resp. rate 17, height 5\' 4"  (1.626 m), weight 84.6 kg, SpO2 95 %.   PHYSICAL EXAM Physical exam: Exam:  Gen: NAD Eyes: anicteric sclerae, moist conjunctivae                    CV: no MRG, no carotid bruits, no peripheral edema Mental Status: Alert, follows commands, good historian  Neuro: Detailed Neurologic Exam  Speech:    No aphasia, no dysarthria  Cranial Nerves:    The pupils are equal, round, and reactive to light.. Attempted, Fundi not visualized.  EOMI. Conjugate midline gaze. Visual fields full. Right ptosis (chronic) otherwise face symmetric, Tongue midline. Hearing intact to voice. Shoulder shrug intact. Uvula midline.   Motor Observation:    no involuntary movements noted. Tone appears normal.     Strength:    5/5     Sensation:  Intact to LT, no sensory changes in the extremities, subjective numbness bottom of right foot  Plantars  downgoing.   ASSESSMENT/PLAN Jasmine [name] is a 84 y.o. female with history of  A. Fib ( on coumadin), CHF, HTN, DVT presenting with right leg numbness and weakness. Per patient on 09/17/18 her physician stopped her coumadin and it was re-started on 09/28/18 (INR 1.64 10/02/2018)  She did not receive IV t-PA due to anticoagulation and minimal deficits.  TIA v stroke not seen on MRI likely from subtherapeutic INR (Warfarin was stopped and recently restarted a few days ago).  Resultant: Essentially resolved, some mild numbness on bottom of right foot   CT head - normal  MRI head - No acute intracranial abnormality.  MRA head - Normal intracranial and cervical MRA.   Carotid Doppler - MRA neck  2D Echo - pending  LDL - 60  HgbA1c - 6.4  VTE prophylaxis - none  Diet  - Heart healthy with thin liquids.  warfarin daily prior to admission, now on No antithrombotic. Recommend restarting her Warfarin on discharge.  Patient counseled to be compliant with her antithrombotic medications  Ongoing aggressive stroke risk factor management  Therapy recommendations:  pending  Disposition:  Pending  Hypertension  Stable . Permissive hypertension (OK if < 220/120) but gradually normalize in 5-7 days . Long-term BP goal normotensive  Hyperlipidemia  Lipid lowering medication PTA:  none  LDL 60, goal < 70  Current lipid lowering medication: none  Continue statin at discharge  Diabetes  HgbA1c 6.4, goal < 7.0  Controlled  Other Stroke Risk Factors  Advanced age  Obesity, Body mass index is 32.01 kg/m., recommend weight loss, diet and exercise as appropriate    Other Active Problems  Anemia - Hb 10  Mild Renal Insufficiency - 31 / 1.28  Hypokalemia - 3.2  Bradycardic at times    PLAN  Resume Coumadin       Hospital day # 0   Discussed with Dr. Vista Lawman, patient can be discharged back on her Warfarin. TIA v stroke not seen on MRI likely from  subtherapeutic INR (Warfarin was stopped and recently restarted a few days ago). Restart Warfarin before discharge.     Personally examined patient and images, and have participated in and made any corrections needed to history, physical, neuro exam,assessment and plan as stated above.  I have personally obtained the history, evaluated lab date, reviewed imaging studies and agree with radiology interpretations.   Stroke will sign off at this time. Jasmine Ill, MD Stroke Neurology     To contact Stroke Continuity provider, please refer to [email or web address] After hours, contact General Neurology

## 2018-10-03 NOTE — Progress Notes (Signed)
  Echocardiogram 2D Echocardiogram has been performed.  Jasmine [name] 10/03/2018, 9:38 AM

## 2018-10-03 NOTE — Evaluation (Addendum)
Occupational Therapy Evaluation Patient Details Name: Jasmine [name] MRN: [id number] DOB: 1934-06-04 Today's Date: 10/03/2018    History of Present Illness Pt is a 84 y/o female admitted 10/02/18 with reports of R hand and leg tingling and numbness.  CT and MRI negative.  PMH signficant for but not limited to: afib, CHF, HTN, DVT, vertigo, and per pt report gout.    Clinical Impression   PTA patient reports independent with ADLs, using RW at times for mobility, but recently requiring increased assist due to gout flare.  Patient admitted for above and limited by below (see problem list). Pt currently requires setup for UB ADLs, mod assist for LB ADLs, min assist for transfers and mobility, and min guard for toileting.  Issued red foam handle for feeding due to decreased grasp.  Educated on safety, precautions, edema reduction techniques to B UEs, and recommendations. Patient will have 24/7 support from sister at discharge, and at this time recommend continued OT services while admitted and continued HHOT at discharge in order to maximize independence with ADLs and mobility.  Will continue to follow.     Follow Up Recommendations  Home health OT;Supervision/Assistance - 24 hour    Equipment Recommendations  3 in 1 bedside commode    Recommendations for Other Services PT consult     Precautions / Restrictions Precautions Precautions: Fall Restrictions Weight Bearing Restrictions: No      Mobility Bed Mobility Overal bed mobility: Needs Assistance Bed Mobility: Supine to Sit     Supine to sit: Min assist     General bed mobility comments: trunk support to ascend trunk into sitting  Transfers Overall transfer level: Needs assistance Equipment used: 1 person hand held assist Transfers: Sit to/from Stand Sit to Stand: Min assist         General transfer comment: min assist to power up into standing    Balance Overall balance assessment: Needs assistance Sitting-balance  support: No upper extremity supported;Feet supported Sitting balance-Leahy Scale: Fair     Standing balance support: Single extremity supported;During functional activity Standing balance-Leahy Scale: Poor Standing balance comment: reliant on at least 1 UE support                           ADL either performed or assessed with clinical judgement   ADL Overall ADL's : Needs assistance/impaired Eating/Feeding: Set up;Sitting;With adaptive utensils   Grooming: Min guard;Standing   Upper Body Bathing: Set up;Sitting   Lower Body Bathing: Moderate assistance;Sit to/from stand   Upper Body Dressing : Set up;Sitting   Lower Body Dressing: Moderate assistance;Sit to/from stand Lower Body Dressing Details (indicate cue type and reason): assist to don socks Toilet Transfer: Minimal assistance;Ambulation;Regular Toilet;Grab bars   Toileting- Clothing Manipulation and Hygiene: Minimal assistance;Sit to/from stand       Functional mobility during ADLs: Minimal assistance General ADL Comments: Pt requires increased time for all tasks, limited by decreased functional use B hands and decreased activity tolerance.     Vision Baseline Vision/History: Wears glasses Wears Glasses: At all times Patient Visual Report: No change from baseline Vision Assessment?: No apparent visual deficits     Perception     Praxis      Pertinent Vitals/Pain Pain Assessment: Faces Faces Pain Scale: Hurts little more Pain Location: B hands  Pain Descriptors / Indicators: Discomfort Pain Intervention(s): Limited activity within patient's tolerance;Monitored during session     Hand Dominance Right   Extremity/Trunk Assessment Upper  Extremity Assessment Upper Extremity Assessment: RUE deficits/detail;LUE deficits/detail RUE Deficits / Details: 4/5 grossly MMT proximally, edema B hands with inability to make full flexion pt reports gout flare  RUE Sensation: WNL RUE Coordination: decreased  fine motor LUE Deficits / Details: 4/5 grossly MMT proximally, edema B hands with inability to make full flexion pt reports gout flare  LUE Sensation: WNL LUE Coordination: decreased fine motor   Lower Extremity Assessment Lower Extremity Assessment: Defer to PT evaluation       Communication Communication Communication: No difficulties   Cognition Arousal/Alertness: Awake/alert Behavior During Therapy: WFL for tasks assessed/performed Overall Cognitive Status: Within Functional Limits for tasks assessed                                     General Comments  HR increased to 116 after mobility and self care, sister present and supportive    Exercises     Shoulder Instructions      Home Living Family/patient expects to be discharged to:: Private residence Living Arrangements: Other relatives(sister) Available Help at Discharge: Family;Available 24 hours/day Type of Home: Apartment Home Access: Stairs to enter Entrance Stairs-Number of Steps: 1 Entrance Stairs-Rails: Right Home Layout: One level     Bathroom Shower/Tub: Teacher, early years/pre: Standard     Home Equipment: Environmental consultant - 2 wheels          Prior Functioning/Environment Level of Independence: Independent        Comments: used RW at times, reports increased assist recently due to gout flare  beginning in october         OT Problem List: Decreased strength;Decreased range of motion;Decreased activity tolerance;Impaired balance (sitting and/or standing);Decreased coordination;Decreased safety awareness;Decreased knowledge of use of DME or AE;Decreased knowledge of precautions;Pain;Increased edema      OT Treatment/Interventions: Self-care/ADL training;Therapeutic exercise;Energy conservation;DME and/or AE instruction;Therapeutic activities;Patient/family education;Balance training    OT Goals(Current goals can be found in the care plan section) Acute Rehab OT Goals Patient  Stated Goal: to get home  OT Goal Formulation: With patient Time For Goal Achievement: 10/17/18 Potential to Achieve Goals: Good  OT Frequency: Min 2X/week   Barriers to D/C:            Co-evaluation              AM-PAC PT "6 Clicks" Daily Activity     Outcome Measure Help from another person eating meals?: None Help from another person taking care of personal grooming?: A Little Help from another person toileting, which includes using toliet, bedpan, or urinal?: A Little Help from another person bathing (including washing, rinsing, drying)?: A Lot Help from another person to put on and taking off regular upper body clothing?: None Help from another person to put on and taking off regular lower body clothing?: A Lot 6 Click Score: 18   End of Session Equipment Utilized During Treatment: Gait belt Nurse Communication: Mobility status  Activity Tolerance: Patient tolerated treatment well Patient left: with call bell/phone within reach;with nursing/sitter in room;Other (comment)(seated EOB)  OT Visit Diagnosis: Other abnormalities of gait and mobility (R26.89);Unsteadiness on feet (R26.81);Muscle weakness (generalized) (M62.81);Pain Pain - Right/Left: (bilateral) Pain - part of body: Hand                Time: 2376-2831 OT Time Calculation (min): 34 min Charges:  OT General Charges $OT Visit: 1 Visit OT Evaluation $OT Eval  Moderate Complexity: 1 Mod OT Treatments $Self Care/Home Management : 8-22 mins  Delight Stare, OT Acute Rehabilitation Services Pager [phone] Office [phone]   Delight Stare 10/03/2018, 8:55 AM

## 2018-10-03 NOTE — Discharge Summary (Signed)
Jasmine [name], is a 84 y.o. female  DOB 1934/01/05  MRN [id number].  Admission date:  10/02/2018  Admitting Physician  Albertine Patricia, MD  Discharge Date:  10/03/2018   Primary MD  [name], MD  Recommendations for primary care physician for things to follow:  PT/ INR CBC and BMP to follow up on  Anemia, potassium and renal function  Admission Diagnosis  TIA (transient ischemic attack) [G45.9] Right leg numbness [R20.0] History of atrial fibrillation [Z86.79]   Discharge Diagnosis  TIA (transient ischemic attack) [G45.9] Right leg numbness [R20.0] History of atrial fibrillation [Z86.79]    Active Problems:   Atrial fibrillation ([place])   Hypertension   Deep venous thrombosis of lower extremity (HCC)   Chronic diastolic HF (heart failure) ([place])   Long term current use of anticoagulant therapy   TIA (transient ischemic attack)      Past Medical History:  Diagnosis Date  . Allergic rhinitis due to pollen   . Atrial fibrillation with rapid ventricular response ([place])   . Congestive heart failure, unspecified    patient states she is not aware of this  . Dizziness   . DVT (deep venous thrombosis) (HCC) left leg  . Essential hypertension, malignant   . GERD (gastroesophageal reflux disease)   . Heart murmur   . Insulin resistance    History of insulin resistance.  . Insulin resistance   . Obesity   . Pure hypercholesterolemia   . Vertigo     Past Surgical History:  Procedure Laterality Date  . APPENDECTOMY    . BREAST BIOPSY    . CHOLECYSTECTOMY    . PARTIAL HYSTERECTOMY         HPI  from the history and physical done on the day of admission:   Jasmine [name]  is a 84 y.o. female, with past medical history of A. fib, on warfarin, CHF, hypertension, lower extremity diagnosed thousand 13, presents to ED with complaints of right leg tingling and numbness, patient reports  she woke up this morning in her usual state of health, reports around 12:30 PM she had some right leg and hand tingling and numbness, denies any headache, visual changes, facial droop, slurred speech, or actual weakness, smoking, alcohol use, still reports her tingling and numbness persists, but she is compliant with her antihypertensive medication, has 2 new medication started by PCP, reports her warfarin has been stopped from [place] till October/21, as well she was recently started on colchicine for gout. In ED CT head with no acute findings, pressure was elevated 163/68, baseline creatinine around 1.5, INR subtherapeutic at 1.6, she did receive full dose aspirin in ED, seen by neurology, and we were consulted to admit    Hospital Course:   On admission patient underwent MRI/A  Head/ Neck, 10/02/2018,  which was negative for acute abnormality.  A 2D echocardiogram  Completed today, 10/03/2017 was negative for any cardiac 7.7 -EF 50-55%, mild LVH mild to moderate MR severely dilated left atrium mild right atrial dilatation  moderate TR pulmonary systolic pressure of 50 mmHg.  Labs indicated A1c of 6.4% of LDL of 60.   Patient'[name] cleared for discharge today by Neurology with resumption of her warfarin,   With outpatient follow-up a     Discharge Condition: stable and satisfactory  Follow UP     Consults obtained -  Neurology, Dr. Jaynee Eagles  Diet and Activity recommendation:  As advised  Discharge Instructions     Discharge Instructions    Call MD for:  difficulty breathing, headache or visual disturbances   Complete by:  As directed    Call MD for:  difficulty breathing, headache or visual disturbances   Complete by:  As directed    Call MD for:  extreme fatigue   Complete by:  As directed    Call MD for:  extreme fatigue   Complete by:  As directed    Call MD for:  hives   Complete by:  As directed    Call MD for:  hives   Complete by:  As directed    Call MD for:  persistant  dizziness or light-headedness   Complete by:  As directed    Call MD for:  persistant dizziness or light-headedness   Complete by:  As directed    Call MD for:  persistant nausea and vomiting   Complete by:  As directed    Call MD for:  persistant nausea and vomiting   Complete by:  As directed    Call MD for:  redness, tenderness, or signs of infection (pain, swelling, redness, odor or green/yellow discharge around incision site)   Complete by:  As directed    Call MD for:  redness, tenderness, or signs of infection (pain, swelling, redness, odor or green/yellow discharge around incision site)   Complete by:  As directed    Call MD for:  severe uncontrolled pain   Complete by:  As directed    Call MD for:  severe uncontrolled pain   Complete by:  As directed    Call MD for:  temperature >100.4   Complete by:  As directed    Call MD for:  temperature >100.4   Complete by:  As directed    Diet - low sodium heart healthy   Complete by:  As directed    Diet - low sodium heart healthy   Complete by:  As directed    Increase activity slowly   Complete by:  As directed    Increase activity slowly   Complete by:  As directed         Discharge Medications     Allergies as of 10/03/2018      Reactions   Codeine    Lovenox [enoxaparin Sodium]    Norvasc [amlodipine Besylate]    Sulfa Antibiotics    Latex Rash      Medication List    STOP taking these medications   atenolol 100 MG tablet Commonly known as:  TENORMIN     TAKE these medications   aspirin 81 MG chewable tablet Chew 4 tablets (324 mg total) by mouth once for 1 dose.   colchicine 0.6 MG tablet Take 0.6 mg by mouth daily.   furosemide 40 MG tablet Commonly known as:  LASIX TAKE 1 TABLET BY MOUTH ONCE DAILY   hydrALAZINE 25 MG tablet Commonly known as:  APRESOLINE Take 50 mg by mouth 2 (two) times daily.   meclizine 25 MG tablet Commonly known as:  ANTIVERT Take 25  mg by mouth 3 (three) times daily  as needed for dizziness.   potassium chloride 10 MEQ tablet Commonly known as:  K-DUR,KLOR-CON Take 1 tablet (10 mEq total) by mouth daily. Start taking on:  10/04/2018   spironolactone 25 MG tablet Commonly known as:  ALDACTONE Take 50 mg by mouth daily.   warfarin 1 MG tablet Commonly known as:  COUMADIN Take as directed. If you are unsure how to take this medication, talk to your nurse or doctor. Original instructions:  Take 1 mg by mouth daily.       Major procedures and Radiology Reports - PLEASE review detailed and final reports for all details, in brief -   TYRONE BALASH  ECHO COMPLETE WO IMAGING ENHANCING AGENT  Order# [id number]  Reading physician: Thayer Headings, MD Ordering physician: Albertine Patricia, MD Study date: 10/03/18  Study Result   Result status: Final result                              *[facility]*                         [address], [place] [address]                            [phone]  ------------------------------------------------------------------- Transthoracic Echocardiography  Patient:    Jasmine [name], Jasmine [name] MR #:       [id number] Study Date: 10/03/2018 Gender:     F Age:        49 Height:     162.6 cm Weight:     84.6 kg BSA:        1.99 m^2 Pt. Status: Room:       3W04C   ADMITTING    Jasmine [name], Jasmine [name]  ORDERING     Jasmine [name], Jasmine [name]  SONOGRAPHER  Johny Chess, RDCS, CCT  ATTENDING    Mariea Clonts  PERFORMING   [facility], Inpatient  cc:  ------------------------------------------------------------------- LV EF: 50% -   55%  ------------------------------------------------------------------- Indications:      TIA 435.9.  ------------------------------------------------------------------- History:   PMH:   Atrial fibrillation.  Risk factors:  Diastolic heart failure.  Hypertension.  ------------------------------------------------------------------- Study Conclusions  - Left ventricle: The cavity size was normal. Wall thickness was   increased in a pattern of mild LVH. There was focal basal   hypertrophy. Systolic function was normal. The estimated ejection   fraction was in the range of 50% to 55%. Wall motion was normal;   there were no regional wall motion abnormalities. - Mitral valve: There was mild to moderate regurgitation. - Left atrium: The atrium was severely dilated. - Right atrium: The atrium was mildly dilated. - Tricuspid valve: There was moderate regurgitation. - Pulmonary arteries: Systolic pressure was moderately increased.   PA peak pressure: 50 mm Hg ([name]).  ------------------------------------------------------------------- Study data:  Comparison was made to the study of 04/01/2017.  Study status:  Routine.  Procedure:  The patient reported no pain pre or post test. Transthoracic echocardiography. Image quality was adequate.  Study completion:  There were no complications.  Transthoracic echocardiography.  M-mode, complete 2D, spectral Doppler, and color Doppler.  Birthdate:  Patient birthdate: 11/23/1934.  Age:  Patient is 84 yr old.  Sex:  Gender: female. BMI: 32 kg/m^2.  Blood pressure:     145/54  Patient status: Inpatient.  Study date:  Study date: 10/03/2018. Study time: 09:00 AM.  Location:  Echo laboratory.  -------------------------------------------------------------------  ------------------------------------------------------------------- Left ventricle:  The cavity size was normal. Wall thickness was increased in a pattern of mild LVH. There was focal basal hypertrophy. Systolic function was normal. The estimated ejection fraction was in the range of 50% to 55%. Wall motion was normal; there were no regional wall motion  abnormalities.  ------------------------------------------------------------------- Aortic valve:   Structurally normal valve.   Cusp separation was normal.  Doppler:  Transvalvular velocity was within the normal range. There was no stenosis. There was no regurgitation.  ------------------------------------------------------------------- Aorta:  Aortic root: The aortic root was normal in size. Ascending aorta: The ascending aorta was normal in size.  ------------------------------------------------------------------- Mitral valve:   Mildly thickened leaflets .  Doppler:  There was mild to moderate regurgitation.  ------------------------------------------------------------------- Left atrium:  The atrium was severely dilated.  ------------------------------------------------------------------- Right ventricle:  The cavity size was normal. Systolic function was normal.  ------------------------------------------------------------------- Pulmonic valve:    The valve appears to be grossly normal. Doppler:  There was mild regurgitation.  ------------------------------------------------------------------- Tricuspid valve:   The valve appears to be grossly normal. Doppler:  There was moderate regurgitation.  ------------------------------------------------------------------- Pulmonary artery:   Systolic pressure was moderately increased.  ------------------------------------------------------------------- Right atrium:  The atrium was mildly dilated.  ------------------------------------------------------------------- Pericardium:  There was no pericardial effusion.  ------------------------------------------------------------------- Systemic veins: Inferior vena cava: The vessel was normal in size. The respirophasic diameter changes were in the normal range (>= 50%), consistent with normal central venous  pressure.  ------------------------------------------------------------------- Measurements   Left ventricle                         Value        Reference  LV ID, ED, PLAX chordal        (L)     40    mm     43 - 52  LV ID, ES, PLAX chordal                24    mm     23 - 38  LV fx shortening, PLAX chordal         40    %      >=29  LV PW thickness, ED                    11    mm     ----------  IVS/LV PW ratio, ED                    0.73         <=1.3    Ventricular septum                     Value        Reference  IVS thickness, ED                      8     mm     ----------    LVOT  Value        Reference  LVOT ID, [name]                             18    mm     ----------  LVOT area                              2.54  cm^2   ----------    Aorta                                  Value        Reference  Aortic root ID, ED                     21    mm     ----------    Left atrium                            Value        Reference  LA ID, A-P, ES                         49    mm     ----------  LA ID/bsa, A-P                 (H)     2.47  cm/m^2 <=2.2  LA volume, [name]                           116   ml     ----------  LA volume/bsa, [name]                       58.4  ml/m^2 ----------  LA volume, ES, 1-p A4C                 105   ml     ----------  LA volume/bsa, ES, 1-p A4C             52.9  ml/m^2 ----------  LA volume, ES, 1-p A2C                 124   ml     ----------  LA volume/bsa, ES, 1-p A2C             62.4  ml/m^2 ----------    Pulmonary arteries                     Value        Reference  PA pressure, [name], DP             (H)     50    mm Hg  <=30    Right atrium                           Value        Reference  RA ID, [name]-I, ES, A4C            (H)     65    mm     34 - 49  RA area, ES, A4C               (  H)     19.6  cm^2   8.3 - 19.5  RA volume, ES, A/L                     48.6  ml     ----------  RA volume/bsa, ES, A/L                 24.5   ml/m^2 ----------    Systemic veins                         Value        Reference  Estimated CVP                          5     mm Hg  ----------  Legend: (L)  and  (H)  mark values outside specified reference range.  ------------------------------------------------------------------- Prepared and Electronically Authenticated by  Mertie Moores, M.D. 2019-10-26T13:36:46   Ct Head Wo Contrast  Result Date: 10/02/2018 CLINICAL DATA:  84 year old female with right leg weakness and numbness beginning at 1230 hours. EXAM: CT HEAD WITHOUT CONTRAST TECHNIQUE: Contiguous axial images were obtained from the base of the skull through the vertex without intravenous contrast. COMPARISON:  Brain MRI 12/23/2011.  Head CT 06/20/2007. FINDINGS: Brain: Cerebral volume is within normal limits for age. No midline shift, ventriculomegaly, mass effect, evidence of mass lesion, intracranial hemorrhage or evidence of cortically based acute infarction. Normal for age gray-white matter differentiation throughout the brain. No cortical encephalomalacia identified. Vascular: Calcified atherosclerosis at the skull base. No suspicious intracranial vascular hyperdensity. Dominant left vertebral artery. Skull: Chronic 12 millimeter dural calcification along the left vertex is unchanged since 2008. No acute osseous abnormality identified. Sinuses/Orbits: Visualized paranasal sinuses and mastoids are stable since 2008 and well pneumatized. Other: Visualized orbits and scalp soft tissues are within normal limits. IMPRESSION: Normal for age noncontrast CT appearance of the brain. Electronically Signed   By: Genevie Ann M.D.   On: 10/02/2018 15:17   Mr Jodene Nam Neck W Wo Contrast  Result Date: 10/02/2018 CLINICAL DATA:  Right lower extremity numbness and weakness EXAM: MR HEAD WITHOUT CONTRAST MR CIRCLE OF WILLIS WITHOUT CONTRAST MRA OF THE NECK WITHOUT AND WITH CONTRAST TECHNIQUE: Multiplanar, multiecho pulse sequences of the brain,  circle of willis and surrounding structures were obtained without intravenous contrast. Angiographic images of the neck were obtained using MRA technique without and with intravenous contrast. CONTRAST:  8 mL Gadavist COMPARISON:  Head CT 10/02/2018 FINDINGS: MRI HEAD FINDINGS BRAIN: The midline structures are normal. There is no acute infarct, acute hemorrhage or mass. Minimal white matter hyperintensity, nonspecific and commonly seen in asymptomatic patients of this age. Mild atrophy without lobar predilection. Blood-sensitive sequences show no chronic microhemorrhage or superficial siderosis. SKULL AND UPPER CERVICAL SPINE: The visualized skull base, calvarium, upper cervical spine and extracranial soft tissues are normal. SINUSES/ORBITS: No fluid levels or advanced mucosal thickening. No mastoid or middle ear effusion. The orbits are normal. MRA HEAD FINDINGS Intracranial internal carotid arteries: Normal. Anterior cerebral arteries: Normal. Middle cerebral arteries: Normal. Posterior communicating arteries: Absent bilaterally. Posterior cerebral arteries: Normal. Basilar artery: Normal. Vertebral arteries: Left dominant. Normal. Superior cerebellar arteries: Normal. Anterior inferior cerebellar arteries: Normal. Posterior inferior cerebellar arteries: Normal. MRA NECK FINDINGS Aortic arch: Normal 3 vessel aortic branching pattern. The visualized subclavian arteries are normal. Right carotid system: Normal course and caliber without stenosis or evidence of  dissection. Left carotid system: Normal course and caliber without stenosis or evidence of dissection. Vertebral arteries: Left dominant. Vertebral artery origins are normal. Vertebral arteries are normal in course and caliber to the vertebrobasilar confluence without stenosis or evidence of dissection. IMPRESSION: 1. No acute intracranial abnormality. 2. Minimal chronic white matter disease for age. This may indicate mild chronic small vessel disease. 3.  Normal intracranial and cervical MRA. Electronically Signed   By: Ulyses Jarred M.D.   On: 10/02/2018 18:38   Mr Brain Wo Contrast  Result Date: 10/02/2018 CLINICAL DATA:  Right lower extremity numbness and weakness EXAM: MR HEAD WITHOUT CONTRAST MR CIRCLE OF WILLIS WITHOUT CONTRAST MRA OF THE NECK WITHOUT AND WITH CONTRAST TECHNIQUE: Multiplanar, multiecho pulse sequences of the brain, circle of willis and surrounding structures were obtained without intravenous contrast. Angiographic images of the neck were obtained using MRA technique without and with intravenous contrast. CONTRAST:  8 mL Gadavist COMPARISON:  Head CT 10/02/2018 FINDINGS: MRI HEAD FINDINGS BRAIN: The midline structures are normal. There is no acute infarct, acute hemorrhage or mass. Minimal white matter hyperintensity, nonspecific and commonly seen in asymptomatic patients of this age. Mild atrophy without lobar predilection. Blood-sensitive sequences show no chronic microhemorrhage or superficial siderosis. SKULL AND UPPER CERVICAL SPINE: The visualized skull base, calvarium, upper cervical spine and extracranial soft tissues are normal. SINUSES/ORBITS: No fluid levels or advanced mucosal thickening. No mastoid or middle ear effusion. The orbits are normal. MRA HEAD FINDINGS Intracranial internal carotid arteries: Normal. Anterior cerebral arteries: Normal. Middle cerebral arteries: Normal. Posterior communicating arteries: Absent bilaterally. Posterior cerebral arteries: Normal. Basilar artery: Normal. Vertebral arteries: Left dominant. Normal. Superior cerebellar arteries: Normal. Anterior inferior cerebellar arteries: Normal. Posterior inferior cerebellar arteries: Normal. MRA NECK FINDINGS Aortic arch: Normal 3 vessel aortic branching pattern. The visualized subclavian arteries are normal. Right carotid system: Normal course and caliber without stenosis or evidence of dissection. Left carotid system: Normal course and caliber without  stenosis or evidence of dissection. Vertebral arteries: Left dominant. Vertebral artery origins are normal. Vertebral arteries are normal in course and caliber to the vertebrobasilar confluence without stenosis or evidence of dissection. IMPRESSION: 1. No acute intracranial abnormality. 2. Minimal chronic white matter disease for age. This may indicate mild chronic small vessel disease. 3. Normal intracranial and cervical MRA. Electronically Signed   By: Ulyses Jarred M.D.   On: 10/02/2018 18:38   Mr Jodene Nam Head Wo Contrast  Result Date: 10/02/2018 CLINICAL DATA:  Right lower extremity numbness and weakness EXAM: MR HEAD WITHOUT CONTRAST MR CIRCLE OF WILLIS WITHOUT CONTRAST MRA OF THE NECK WITHOUT AND WITH CONTRAST TECHNIQUE: Multiplanar, multiecho pulse sequences of the brain, circle of willis and surrounding structures were obtained without intravenous contrast. Angiographic images of the neck were obtained using MRA technique without and with intravenous contrast. CONTRAST:  8 mL Gadavist COMPARISON:  Head CT 10/02/2018 FINDINGS: MRI HEAD FINDINGS BRAIN: The midline structures are normal. There is no acute infarct, acute hemorrhage or mass. Minimal white matter hyperintensity, nonspecific and commonly seen in asymptomatic patients of this age. Mild atrophy without lobar predilection. Blood-sensitive sequences show no chronic microhemorrhage or superficial siderosis. SKULL AND UPPER CERVICAL SPINE: The visualized skull base, calvarium, upper cervical spine and extracranial soft tissues are normal. SINUSES/ORBITS: No fluid levels or advanced mucosal thickening. No mastoid or middle ear effusion. The orbits are normal. MRA HEAD FINDINGS Intracranial internal carotid arteries: Normal. Anterior cerebral arteries: Normal. Middle cerebral arteries: Normal. Posterior communicating arteries: Absent bilaterally. Posterior cerebral  arteries: Normal. Basilar artery: Normal. Vertebral arteries: Left dominant. Normal.  Superior cerebellar arteries: Normal. Anterior inferior cerebellar arteries: Normal. Posterior inferior cerebellar arteries: Normal. MRA NECK FINDINGS Aortic arch: Normal 3 vessel aortic branching pattern. The visualized subclavian arteries are normal. Right carotid system: Normal course and caliber without stenosis or evidence of dissection. Left carotid system: Normal course and caliber without stenosis or evidence of dissection. Vertebral arteries: Left dominant. Vertebral artery origins are normal. Vertebral arteries are normal in course and caliber to the vertebrobasilar confluence without stenosis or evidence of dissection. IMPRESSION: 1. No acute intracranial abnormality. 2. Minimal chronic white matter disease for age. This may indicate mild chronic small vessel disease. 3. Normal intracranial and cervical MRA. Electronically Signed   By: Ulyses Jarred M.D.   On: 10/02/2018 18:38    Micro Results    No results found for this or any previous visit (from the past 240 hour([name])).     Today   Subjective    Jasmine [name] today has no  Dizziness, visual changes, extremity weakness, has some numbness of buttom of right foot       Patient has been seen and examined prior to discharge   Objective   Blood pressure (!) 143/86, pulse 71, temperature (!) 97.3 F (36.3 C), temperature source Oral, resp. rate 16, height 5\' 4"  (1.626 m), weight 84.6 kg, SpO2 99 %.  No intake or output data in the 24 hours ending 10/03/18 1705  Exam Gen:- Awake  , comfortable HEENT:- [place].AT,  Neck-Supple Neck,No JVD,  Lungs- mostly clear CV- S1, S2 normal Abd-  +ve B.Sounds, Abd Soft, No tenderness,    Extremity/Skin:- Intact peripheral pulses    Data Review   CBC w Diff:  Lab Results  Component Value Date   WBC 6.7 10/03/2018   HGB 10.0 (L) 10/03/2018   HCT 31.9 (L) 10/03/2018   PLT 154 10/03/2018   LYMPHOPCT 7 10/02/2018   MONOPCT 4 10/02/2018   EOSPCT 0 10/02/2018   BASOPCT 0 10/02/2018     CMP:  Lab Results  Component Value Date   NA 137 10/03/2018   NA 136 06/15/2018   K 3.2 (L) 10/03/2018   CL 104 10/03/2018   CO2 21 (L) 10/03/2018   BUN 31 (H) 10/03/2018   BUN 35 (H) 06/15/2018   CREATININE 1.28 (H) 10/03/2018   CREATININE 1.54 (H) 08/06/2016   PROT 6.7 10/02/2018   PROT 7.5 06/15/2018   ALBUMIN 3.2 (L) 10/02/2018   ALBUMIN 4.1 06/15/2018   BILITOT 1.5 (H) 10/02/2018   BILITOT 1.0 06/15/2018   ALKPHOS 93 10/02/2018   AST 27 10/02/2018   ALT 14 10/02/2018  .   Total Discharge time is about 33 minutes  Benito Mccreedy M.D on 10/03/2018 at 5:05 PM  [facility]   Office  [phone]  Dragon dictation system was used to create this note, attempts have been made to correct errors, however presence of uncorrected errors is not a reflection quality of care provided

## 2018-11-01 NOTE — Progress Notes (Signed)
Cardiology Office Note:    Date:  11/02/2018   ID:  Jasmine [name], DOB 1934-12-06, MRN [id number]  PCP:  [name], MD  Cardiologist:  Sinclair Grooms, MD   Referring MD: [name], MD   Chief Complaint  Patient presents with  . Atrial Fibrillation  . Congestive Heart Failure    History of Present Illness:    Jasmine [name] is a 84 y.o. female with a hx of chronic atrial  fibrillation,CHADSVASC score greater than 2,diastolic heart failure,and hypertension.  Recently hospitalized for right arm numbness and right leg numbness and weakness.  Neurological evaluation was performed without definite abnormality identified.  The numbness was transient.  She was felt to have a TIA.  This was in the setting of discontinuation of Coumadin for 10 days while the patient was on colchicine for gout in both hands and feet.  Coumadin has been resumed.  No recurrence of neurological symptoms.  She is accompanied by her sister.  The patient has a difficult time remembering exactly when she wanted to talk to me about.  She is confused about whether any of my cardiology colleagues saw her while she was hospitalized in October.  She has been back to see primary physician, Dr. Kevan Ny.  She has mild orthopnea.  She has difficulty with ambulating.  She is in a wheelchair today.  Make it may get through it was on the most prescribe more day significantly.    Past Medical History:  Diagnosis Date  . Allergic rhinitis due to pollen   . Atrial fibrillation with rapid ventricular response ([place])   . Congestive heart failure, unspecified    patient states she is not aware of this  . Dizziness   . DVT (deep venous thrombosis) (HCC) left leg  . Essential hypertension, malignant   . GERD (gastroesophageal reflux disease)   . Heart murmur   . Insulin resistance    History of insulin resistance.  . Insulin resistance   . Obesity   . Pure hypercholesterolemia   . Vertigo     Past Surgical  History:  Procedure Laterality Date  . APPENDECTOMY    . BREAST BIOPSY    . CHOLECYSTECTOMY    . PARTIAL HYSTERECTOMY      Current Medications: Current Meds  Medication Sig  . atenolol (TENORMIN) 100 MG tablet Take 100 mg by mouth daily.  . colchicine 0.6 MG tablet Take 0.6 mg by mouth daily.  . furosemide (LASIX) 40 MG tablet TAKE 1 TABLET BY MOUTH ONCE DAILY  . hydrALAZINE (APRESOLINE) 25 MG tablet Take 50 mg by mouth 2 (two) times daily.  . meclizine (ANTIVERT) 25 MG tablet Take 25 mg by mouth 3 (three) times daily as needed for dizziness.   Marland Kitchen spironolactone (ALDACTONE) 25 MG tablet Take 50 mg by mouth daily.   Marland Kitchen warfarin (COUMADIN) 1 MG tablet Take 1 mg by mouth daily.      Allergies:   Codeine; Lovenox [enoxaparin sodium]; Norvasc [amlodipine besylate]; Sulfa antibiotics; and Latex   Social History   Socioeconomic History  . Marital status: Single    Spouse name: Not on file  . Number of children: Not on file  . Years of education: Not on file  . Highest education level: Not on file  Occupational History  . Not on file  Social Needs  . Financial resource strain: Not on file  . Food insecurity:    Worry: Not on file  Inability: Not on file  . Transportation needs:    Medical: Not on file    Non-medical: Not on file  Tobacco Use  . Smoking status: Never Smoker  . Smokeless tobacco: Never Used  Substance and Sexual Activity  . Alcohol use: No  . Drug use: No  . Sexual activity: Not Currently    Birth control/protection: Post-menopausal  Lifestyle  . Physical activity:    Days per week: Not on file    Minutes per session: Not on file  . Stress: Not on file  Relationships  . Social connections:    Talks on phone: Not on file    Gets together: Not on file    Attends religious service: Not on file    Active member of club or organization: Not on file    Attends meetings of clubs or organizations: Not on file    Relationship status: Not on file  Other  Topics Concern  . Not on file  Social History Narrative  . Not on file     Family History: The patient's family history includes Diabetes in her mother; Hypertension in her sister; Pulmonary embolism in her sister.  ROS:   Please see the history of present illness.    She has chronic right ankle pain.  She fell at home September 23.  She did not faint.  She developed gout in late September and was started on colchicine at which time Coumadin was held.  She subsequently was admitted to the hospital on October with right leg numbness that was felt to be a TIA.  Coumadin therapy has been resumed.  All other systems reviewed and are negative.  EKGs/Labs/Other Studies Reviewed:    The following studies were reviewed today: 2D Doppler echocardiogram 10/03/2018: Study Conclusions  - Left ventricle: The cavity size was normal. Wall thickness was   increased in a pattern of mild LVH. There was focal basal   hypertrophy. Systolic function was normal. The estimated ejection   fraction was in the range of 50% to 55%. Wall motion was normal;   there were no regional wall motion abnormalities. - Mitral valve: There was mild to moderate regurgitation. - Left atrium: The atrium was severely dilated. - Right atrium: The atrium was mildly dilated. - Tricuspid valve: There was moderate regurgitation. - Pulmonary arteries: Systolic pressure was moderately increased.   PA peak pressure: 50 mm Hg (S).  EKG:  EKG is  ordered today.  The ekg on 10/06/2018 strata atrial fibrillation with controlled rate and occasional PVCs.  PVCs were new.  Rate was slightly faster than usual.  Recent Labs: 06/15/2018: NT-Pro BNP 1,850 10/02/2018: ALT 14 10/03/2018: BUN 31; Creatinine, Ser 1.28; Hemoglobin 10.0; Platelets 154; Potassium 3.2; Sodium 137  Recent Lipid Panel    Component Value Date/Time   CHOL 95 10/03/2018 0521   TRIG 45 10/03/2018 0521   HDL 26 (L) 10/03/2018 0521   CHOLHDL 3.7 10/03/2018 0521    VLDL 9 10/03/2018 0521   LDLCALC 60 10/03/2018 0521    Physical Exam:    VS:  BP (!) 172/90   Pulse 89   Ht 5\' 4"  (1.626 m)   Wt 170 lb (77.1 kg)   SpO2 98%   BMI 29.18 kg/m     Wt Readings from Last 3 Encounters:  11/02/18 170 lb (77.1 kg)  10/03/18 186 lb 8.2 oz (84.6 kg)  04/14/17 188 lb 8 oz (85.5 kg)     GEN:  Well nourished, well developed  in no acute distress HEENT: Normal NECK: No JVD. LYMPHATICS: No lymphadenopathy CARDIAC: IIRR, no murmur, no gallop, trace bilateral ankle edema. VASCULAR: 2+ bilateral radial and carotid pulses.  No bruits. RESPIRATORY:  Clear to auscultation without rales, wheezing or rhonchi  ABDOMEN: Soft, non-tender, non-distended, No pulsatile mass, MUSCULOSKELETAL: No deformity  SKIN: Warm and dry NEUROLOGIC:  Alert and oriented x 3 PSYCHIATRIC:  Normal affect   ASSESSMENT:    1. Permanent atrial fibrillation   2. Chronic diastolic HF (heart failure) ([place])   3. Essential hypertension   4. Long term current use of anticoagulant therapy    PLAN:    In order of problems listed above:  1. Rate is reasonably controlled.  I am not sure why Tenormin was discontinued during the recent hospital stay.  Likely she has been placed back on the medication, possibly by Dr. Marlou Sa. 2. She may be a little volume overloaded.  A BMP will be obtained today.  Her current diuretic regimen is already relatively strong since she is on Aldactone 50 mg once per day and erosive Myotte 40 mg/day.  If we need further diuresis, may increase furosemide to 80 mg/day. 3. The blood pressure is poorly controlled.  I am not sure why her ARB was discontinued.  Perhaps it has something to do with kidney function although the October 03, 2018 creatinine is 1.28.  Once I do a little investigation to figure out why all the medication changes, will likely consider resuming ARB therapy but may have to decrease spironolactone to 25 mg to allow a safety margin for  potassium. 4. Continue Coumadin and follow-up in the Coumadin clinic  Once laboratory data returns, we will decide what to do to further lower her blood pressure which today is 172/90 mmHg.  I did discuss low-salt diet with her and avoidance of nonsteroidal anti-inflammatory agents.  Likely need to consider resuming ARB therapy.  She already has articular complaints and therefore hydralazine is not a good medication for her long-term.  If we add an ARB, mineralocorticoid receptor blocker therapy may need to be discontinued or at the very least decrease in intensity.  We will try to speak with Dr. Marlou Sa to see if I am missing something with reference to why ARB therapy was discontinued.  Greater than 50% of the time during this office visit was spent in education, counseling, and coordination of care related to underlying disease process and testing as outlined.    Medication Adjustments/Labs and Tests Ordered: Current medicines are reviewed at length with the patient today.  Concerns regarding medicines are outlined above.  Orders Placed This Encounter  Procedures  . Basic metabolic panel  . Pro b natriuretic peptide (BNP)   No orders of the defined types were placed in this encounter.   Patient Instructions  Medication Instructions:  Your physician recommends that you continue on your current medications as directed. Please refer to the Current Medication list given to you today.  If you need a refill on your cardiac medications before your next appointment, please call your pharmacy.   Lab work: TODAY: BMET, pro-BNP If you have labs (blood work) drawn today and your tests are completely normal, you will receive your results only by: Marland Kitchen MyChart Message (if you have MyChart) OR . A paper copy in the mail If you have any lab test that is abnormal or we need to change your treatment, we will call you to review the results.  Testing/Procedures: None ordered  Follow-Up: At  [facility], you and your health needs are our priority.  As part of our continuing mission to provide you with exceptional heart care, we have created designated Provider Care Teams.  These Care Teams include your primary Cardiologist (physician) and Advanced Practice Providers (APPs -  Physician Assistants and Nurse Practitioners) who all work together to provide you with the care you need, when you need it. . You will need a follow up appointment in 9-12 months.  Please call our office 2 months in advance to schedule this appointment.  You may see Sinclair Grooms, MD or one of the following Advanced Practice Providers on your designated Care Team:   . Truitt Merle, NP . Cecilie Kicks, NP . Kathyrn Drown, NP   Any Other Special Instructions Will Be Listed Below (If Applicable).     Signed, Sinclair Grooms, MD  11/02/2018 5:30 PM    [place]

## 2018-11-02 ENCOUNTER — Encounter: Payer: Self-pay | Admitting: Interventional Cardiology

## 2018-11-02 ENCOUNTER — Ambulatory Visit: Payer: Medicare Other | Admitting: Interventional Cardiology

## 2018-11-02 VITALS — BP 172/90 | HR 89 | Ht 64.0 in | Wt 170.0 lb

## 2018-11-02 DIAGNOSIS — I5032 Chronic diastolic (congestive) heart failure: Secondary | ICD-10-CM

## 2018-11-02 DIAGNOSIS — Z7901 Long term (current) use of anticoagulants: Secondary | ICD-10-CM | POA: Diagnosis not present

## 2018-11-02 DIAGNOSIS — I10 Essential (primary) hypertension: Secondary | ICD-10-CM | POA: Diagnosis not present

## 2018-11-02 DIAGNOSIS — I4821 Permanent atrial fibrillation: Secondary | ICD-10-CM

## 2018-11-02 NOTE — Patient Instructions (Signed)
Medication Instructions:  Your physician recommends that you continue on your current medications as directed. Please refer to the Current Medication list given to you today.  If you need a refill on your cardiac medications before your next appointment, please call your pharmacy.   Lab work: TODAY: BMET, pro-BNP If you have labs (blood work) drawn today and your tests are completely normal, you will receive your results only by: Marland Kitchen MyChart Message (if you have MyChart) OR . A paper copy in the mail If you have any lab test that is abnormal or we need to change your treatment, we will call you to review the results.  Testing/Procedures: None ordered  Follow-Up: At [facility], you and your health needs are our priority.  As part of our continuing mission to provide you with exceptional heart care, we have created designated Provider Care Teams.  These Care Teams include your primary Cardiologist (physician) and Advanced Practice Providers (APPs -  Physician Assistants and Nurse Practitioners) who all work together to provide you with the care you need, when you need it. . You will need a follow up appointment in 9-12 months.  Please call our office 2 months in advance to schedule this appointment.  You may see Sinclair Grooms, MD or one of the following Advanced Practice Providers on your designated Care Team:   . Truitt Merle, NP . Cecilie Kicks, NP . Kathyrn Drown, NP   Any Other Special Instructions Will Be Listed Below (If Applicable).

## 2018-11-03 ENCOUNTER — Telehealth: Payer: Self-pay | Admitting: *Deleted

## 2018-11-03 DIAGNOSIS — I5032 Chronic diastolic (congestive) heart failure: Secondary | ICD-10-CM

## 2018-11-03 DIAGNOSIS — R7989 Other specified abnormal findings of blood chemistry: Secondary | ICD-10-CM

## 2018-11-03 LAB — BASIC METABOLIC PANEL
BUN/Creatinine Ratio: 18 (ref 12–28)
BUN: 21 mg/dL (ref 8–27)
CHLORIDE: 105 mmol/L (ref 96–106)
CO2: 17 mmol/L — AB (ref 20–29)
Calcium: 9.8 mg/dL (ref 8.7–10.3)
Creatinine, Ser: 1.19 mg/dL — ABNORMAL HIGH (ref 0.57–1.00)
GFR calc Af Amer: 49 mL/min/{1.73_m2} — ABNORMAL LOW (ref >59)
GFR, EST NON AFRICAN AMERICAN: 43 mL/min/{1.73_m2} — AB (ref >59)
Glucose: 119 mg/dL — ABNORMAL HIGH (ref 65–99)
Potassium: 4.6 mmol/L (ref 3.5–5.2)
SODIUM: 139 mmol/L (ref 134–144)

## 2018-11-03 LAB — PRO B NATRIURETIC PEPTIDE: NT-PRO BNP: 4675 pg/mL — AB (ref 0–738)

## 2018-11-03 MED ORDER — FUROSEMIDE 40 MG PO TABS: 40.0000 mg | ORAL_TABLET | Freq: Two times a day (BID) | ORAL | 3 refills | Status: DC

## 2018-11-03 MED ORDER — SPIRONOLACTONE 25 MG PO TABS: ORAL_TABLET | ORAL | 3 refills | Status: DC

## 2018-11-03 NOTE — Telephone Encounter (Signed)
-----   Message from Belva Crome, MD sent at 11/03/2018  1:37 PM EST ----- Let the patient know the blood tests suggest that there is fluid buildup in her system.  Increase furosemide to 40 mg twice daily and decrease spironolactone to 1 tablet (25 mg) on Monday, Wednesday, and Friday or 12.5 mg daily.  We need to be certain that her Spironolactone tablets or 25 mg in strength at night 50 mg.  Basic metabolic panel and BNP in 1 week. A copy will be sent to [name], MD

## 2018-11-03 NOTE — Telephone Encounter (Signed)
Spoke with pt and went over results and recommendations.  Pt agreeable to plan.  She will have labs drawn 12/5 when her sister can bring her to the office for labs.  Pt and sister repeated instructions to me.  Pt appreciative for call.

## 2018-11-06 LAB — PROTIME-INR

## 2018-11-12 ENCOUNTER — Other Ambulatory Visit: Payer: Medicare Other | Admitting: *Deleted

## 2018-11-12 DIAGNOSIS — R7989 Other specified abnormal findings of blood chemistry: Secondary | ICD-10-CM

## 2018-11-12 DIAGNOSIS — I5032 Chronic diastolic (congestive) heart failure: Secondary | ICD-10-CM

## 2018-11-13 LAB — PRO B NATRIURETIC PEPTIDE: NT-PRO BNP: 2853 pg/mL — AB (ref 0–738)

## 2018-11-13 LAB — BASIC METABOLIC PANEL
BUN/Creatinine Ratio: 26 (ref 12–28)
BUN: 36 mg/dL — AB (ref 8–27)
CALCIUM: 9.6 mg/dL (ref 8.7–10.3)
CO2: 19 mmol/L — ABNORMAL LOW (ref 20–29)
CREATININE: 1.37 mg/dL — AB (ref 0.57–1.00)
Chloride: 98 mmol/L (ref 96–106)
GFR calc Af Amer: 41 mL/min/{1.73_m2} — ABNORMAL LOW (ref >59)
GFR, EST NON AFRICAN AMERICAN: 36 mL/min/{1.73_m2} — AB (ref >59)
Glucose: 147 mg/dL — ABNORMAL HIGH (ref 65–99)
Potassium: 3.6 mmol/L (ref 3.5–5.2)
Sodium: 137 mmol/L (ref 134–144)

## 2018-11-16 ENCOUNTER — Telehealth: Payer: Self-pay | Admitting: *Deleted

## 2018-11-16 DIAGNOSIS — I5032 Chronic diastolic (congestive) heart failure: Secondary | ICD-10-CM

## 2018-11-16 NOTE — Telephone Encounter (Signed)
Thx

## 2018-11-16 NOTE — Telephone Encounter (Signed)
-----   Message from Belva Crome, MD sent at 11/13/2018  1:37 PM EST ----- Let the patient know labs are stable on 40 mg furosemide BID and Spironolactone 12.5 mg daily. Verify that she is taking that dose and inquire if she feels better. BMET and BNP in 1 week. A copy will be sent to [name], MD

## 2018-11-16 NOTE — Telephone Encounter (Signed)
Spoke with the pt and endorsed to her, her lab results and recommendations per Dr Tamala Julian. Per the pt, she states she is feeling much better, and she is taking both her lasix 40 mg po bid and spironolactone 12.5 mg po daily, as prescribed by Dr Tamala Julian. Pt is scheduled for a repeat bmet and pro-bnp in one week on 11/23/18.  Pt verbalized understanding and agrees with this plan. Will send a message to both Dr Tamala Julian and Anderson Malta, to make them aware that pt is feeling better and taking her regimen as advised.

## 2018-11-23 ENCOUNTER — Other Ambulatory Visit: Payer: Medicare Other | Admitting: *Deleted

## 2018-11-23 DIAGNOSIS — I5032 Chronic diastolic (congestive) heart failure: Secondary | ICD-10-CM

## 2018-11-23 LAB — BASIC METABOLIC PANEL
BUN / CREAT RATIO: 26 (ref 12–28)
BUN: 40 mg/dL — ABNORMAL HIGH (ref 8–27)
CALCIUM: 9.7 mg/dL (ref 8.7–10.3)
CO2: 19 mmol/L — AB (ref 20–29)
CREATININE: 1.56 mg/dL — AB (ref 0.57–1.00)
Chloride: 99 mmol/L (ref 96–106)
GFR, EST AFRICAN AMERICAN: 35 mL/min/{1.73_m2} — AB (ref >59)
GFR, EST NON AFRICAN AMERICAN: 31 mL/min/{1.73_m2} — AB (ref >59)
Glucose: 118 mg/dL — ABNORMAL HIGH (ref 65–99)
Potassium: 3.5 mmol/L (ref 3.5–5.2)
Sodium: 139 mmol/L (ref 134–144)

## 2018-11-23 LAB — PRO B NATRIURETIC PEPTIDE: NT-Pro BNP: 3049 pg/mL — ABNORMAL HIGH (ref 0–738)

## 2018-11-24 ENCOUNTER — Telehealth: Payer: Self-pay | Admitting: *Deleted

## 2018-11-24 MED ORDER — FUROSEMIDE 40 MG PO TABS: 60.0000 mg | ORAL_TABLET | Freq: Every day | ORAL | 3 refills | Status: DC

## 2018-11-24 NOTE — Telephone Encounter (Signed)
Spoke with pt and went over recommendations per Dr. Tamala Julian.  Pt currently takes Spironolactone 25mg  MWF.  She would like to continue this as it is hard for her to split pills.  Pt agreeable to appt in January.  Scheduled pt 1/10.

## 2018-11-24 NOTE — Telephone Encounter (Signed)
-----   Message from Belva Crome, MD sent at 11/23/2018  5:26 PM EST ----- Let the patient know to decrease furosemide to 60 mg daily and continue spironolatone 12.5 mg daily. Needs OV in early January with BMET A copy will be sent to [name], MD

## 2018-12-16 ENCOUNTER — Ambulatory Visit: Payer: Medicare Other | Admitting: Podiatry

## 2018-12-16 ENCOUNTER — Encounter: Payer: Self-pay | Admitting: Podiatry

## 2018-12-16 VITALS — BP 168/75

## 2018-12-16 DIAGNOSIS — M79675 Pain in left toe(s): Secondary | ICD-10-CM

## 2018-12-16 DIAGNOSIS — M79674 Pain in right toe(s): Secondary | ICD-10-CM

## 2018-12-16 DIAGNOSIS — I739 Peripheral vascular disease, unspecified: Secondary | ICD-10-CM

## 2018-12-16 DIAGNOSIS — B351 Tinea unguium: Secondary | ICD-10-CM | POA: Diagnosis not present

## 2018-12-16 DIAGNOSIS — L84 Corns and callosities: Secondary | ICD-10-CM | POA: Diagnosis not present

## 2018-12-16 NOTE — Patient Instructions (Addendum)
Onychomycosis/Fungal Toenails  WHAT IS IT? An infection that lies within the keratin of your nail plate that is caused by a fungus.  WHY ME? Fungal infections affect all ages, sexes, races, and creeds.  There may be many factors that predispose you to a fungal infection such as age, coexisting medical conditions such as diabetes, or an autoimmune disease; stress, medications, fatigue, genetics, etc.  Bottom line: fungus thrives in a warm, moist environment and your shoes offer such a location.  IS IT CONTAGIOUS? Theoretically, yes.  You do not want to share shoes, nail clippers or files with someone who has fungal toenails.  Walking around barefoot in the same room or sleeping in the same bed is unlikely to transfer the organism.  It is important to realize, however, that fungus can spread easily from one nail to the next on the same foot.  HOW DO WE TREAT THIS?  There are several ways to treat this condition.  Treatment may depend on many factors such as age, medications, pregnancy, liver and kidney conditions, etc.  It is best to ask your doctor which options are available to you.  1. No treatment.   Unlike many other medical concerns, you can live with this condition.  However for many people this can be a painful condition and may lead to ingrown toenails or a bacterial infection.  It is recommended that you keep the nails cut short to help reduce the amount of fungal nail. 2. Topical treatment.  These range from herbal remedies to prescription strength nail lacquers.  About 40-50% effective, topicals require twice daily application for approximately 9 to 12 months or until an entirely new nail has grown out.  The most effective topicals are medical grade medications available through physicians offices. 3. Oral antifungal medications.  With an 80-90% cure rate, the most common oral medication requires 3 to 4 months of therapy and stays in your system for a year as the new nail grows out.  Oral  antifungal medications do require blood work to make sure it is a safe drug for you.  A liver function panel will be performed prior to starting the medication and after the first month of treatment.  It is important to have the blood work performed to avoid any harmful side effects.  In general, this medication safe but blood work is required. 4. Laser Therapy.  This treatment is performed by applying a specialized laser to the affected nail plate.  This therapy is noninvasive, fast, and non-painful.  It is not covered by insurance and is therefore, out of pocket.  The results have been very good with a 80-95% cure rate.  [facility] is the only practice in the area to offer this therapy. 5. Permanent Nail Avulsion.  Removing the entire nail so that a new nail will not grow back.  Corns and Calluses Corns are small areas of thickened skin that occur on the top, sides, or tip of a toe. They contain a cone-shaped core with a point that can press on a nerve below. This causes pain.  Calluses are areas of thickened skin that can occur anywhere on the body, including the hands, fingers, palms, soles of the feet, and heels. Calluses are usually larger than corns. What are the causes? Corns and calluses are caused by rubbing (friction) or pressure, such as from shoes that are too tight or do not fit properly. What increases the risk? Corns are more likely to develop in people   who have misshapen toes (toe deformities), such as hammer toes. Calluses can occur with friction to any area of the skin. They are more likely to develop in people who:  Work with their hands.  Wear shoes that fit poorly, are too tight, or are high-heeled.  Have toe deformities. What are the signs or symptoms? Symptoms of a corn or callus include:  A hard growth on the skin.  Pain or tenderness under the skin.  Redness and swelling.  Increased discomfort while wearing tight-fitting shoes, if your feet are  affected. If a corn or callus becomes infected, symptoms may include:  Redness and swelling that gets worse.  Pain.  Fluid, blood, or pus draining from the corn or callus. How is this diagnosed? Corns and calluses may be diagnosed based on your symptoms, your medical history, and a physical exam. How is this treated? Treatment for corns and calluses may include:  Removing the cause of the friction or pressure. This may involve: ? Changing your shoes. ? Wearing shoe inserts (orthotics) or other protective layers in your shoes, such as a corn pad. ? Wearing gloves.  Applying medicine to the skin (topical medicine) to help soften skin in the hardened, thickened areas.  Removing layers of dead skin with a file to reduce the size of the corn or callus.  Removing the corn or callus with a scalpel or laser.  Taking antibiotic medicines, if your corn or callus is infected.  Having surgery, if a toe deformity is the cause. Follow these instructions at home:   Take over-the-counter and prescription medicines only as told by your health care provider.  If you were prescribed an antibiotic, take it as told by your health care provider. Do not stop taking it even if your condition starts to improve.  Wear shoes that fit well. Avoid wearing high-heeled shoes and shoes that are too tight or too loose.  Wear any padding, protective layers, gloves, or orthotics as told by your health care provider.  Soak your hands or feet and then use a file or pumice stone to soften your corn or callus. Do this as told by your health care provider.  Check your corn or callus every day for symptoms of infection. Contact a health care provider if you:  Notice that your symptoms do not improve with treatment.  Have redness or swelling that gets worse.  Notice that your corn or callus becomes painful.  Have fluid, blood, or pus coming from your corn or callus.  Have new symptoms. Summary  Corns are  small areas of thickened skin that occur on the top, sides, or tip of a toe.  Calluses are areas of thickened skin that can occur anywhere on the body, including the hands, fingers, palms, and soles of the feet. Calluses are usually larger than corns.  Corns and calluses are caused by rubbing (friction) or pressure, such as from shoes that are too tight or do not fit properly.  Treatment may include wearing any padding, protective layers, gloves, or orthotics as told by your health care provider. This information is not intended to replace advice given to you by your health care provider. Make sure you discuss any questions you have with your health care provider. Document Released: 08/31/2004 Document Revised: 10/08/2017 Document Reviewed: 10/08/2017 Elsevier Interactive Patient Education  2019 [employer].

## 2018-12-17 NOTE — Progress Notes (Signed)
Cardiology Office Note:    Date:  12/18/2018   ID:  AZLIN ZILBERMAN, DOB [birth year], MRN [id number]  PCP:  [name], MD  Cardiologist:  Sinclair Grooms, MD   Referring MD: [name], MD   Chief Complaint  Patient presents with  . Congestive Heart Failure    History of Present Illness:    Jasmine [name] is a 85 y.o. female with a hx of chronic atrial  fibrillation,CHADSVASCscore greater than 2,diastolic heart failure,and hypertension.  With adjustment in diuretic therapy, breathing has significantly improved.  Blood pressure is still suboptimal.  She denies angina.  She has not had syncope or significant lower extremity swelling.  She is sleeping well.  She is relatively sedentary.  She does not recall what her "allergy" to amlodipine was.  Her genetically identical sister is on amlodipine and having no significant issues.  No neurological complaints.  No palpitations.  Past Medical History:  Diagnosis Date  . Allergic rhinitis due to pollen   . Atrial fibrillation with rapid ventricular response ([place])   . Congestive heart failure, unspecified    patient states she is not aware of this  . Dizziness   . DVT (deep venous thrombosis) (HCC) left leg  . Essential hypertension, malignant   . GERD (gastroesophageal reflux disease)   . Heart murmur   . Insulin resistance    History of insulin resistance.  . Insulin resistance   . Obesity   . Pure hypercholesterolemia   . Vertigo     Past Surgical History:  Procedure Laterality Date  . APPENDECTOMY    . BREAST BIOPSY    . CHOLECYSTECTOMY    . PARTIAL HYSTERECTOMY      Current Medications: Current Meds  Medication Sig  . atenolol (TENORMIN) 100 MG tablet Take 100 mg by mouth daily.  . colchicine 0.6 MG tablet Take 0.6 mg by mouth daily.  . furosemide (LASIX) 40 MG tablet Take 1.5 tablets (60 mg total) by mouth daily.  . meclizine (ANTIVERT) 25 MG tablet Take 25 mg by mouth 3 (three) times daily as  needed for dizziness.   Marland Kitchen spironolactone (ALDACTONE) 25 MG tablet Take one tablet by mouth on Monday, Wednesday and Friday  . warfarin (COUMADIN) 1 MG tablet Take 1 mg by mouth daily.   . [DISCONTINUED] hydrALAZINE (APRESOLINE) 50 MG tablet Take 50 mg by mouth 2 (two) times daily.     Allergies:   Codeine; Lovenox [enoxaparin sodium]; Norvasc [amlodipine besylate]; Sulfa antibiotics; and Latex   Social History   Socioeconomic History  . Marital status: Single    Spouse name: Not on file  . Number of children: Not on file  . Years of education: Not on file  . Highest education level: Not on file  Occupational History  . Not on file  Social Needs  . Financial resource strain: Not on file  . Food insecurity:    Worry: Not on file    Inability: Not on file  . Transportation needs:    Medical: Not on file    Non-medical: Not on file  Tobacco Use  . Smoking status: Never Smoker  . Smokeless tobacco: Never Used  Substance and Sexual Activity  . Alcohol use: No  . Drug use: No  . Sexual activity: Not Currently    Birth control/protection: Post-menopausal  Lifestyle  . Physical activity:    Days per week: Not on file    Minutes per session: Not on file  .  Stress: Not on file  Relationships  . Social connections:    Talks on phone: Not on file    Gets together: Not on file    Attends religious service: Not on file    Active member of club or organization: Not on file    Attends meetings of clubs or organizations: Not on file    Relationship status: Not on file  Other Topics Concern  . Not on file  Social History Narrative  . Not on file     Family History: The patient's family history includes Diabetes in her mother; Hypertension in her sister; Pulmonary embolism in her sister.  ROS:   Please see the history of present illness.    Current episodes of vertigo, have been present for quite some time.  Overall is feeling better.  All other systems reviewed and are  negative.  EKGs/Labs/Other Studies Reviewed:    The following studies were reviewed today:   Creatinine is 1.56 November 23, 2018.  LDL is 60 in October 2019.   2D Doppler echocardiogram October 03, 2018: Study Conclusions  - Left ventricle: The cavity size was normal. Wall thickness was   increased in a pattern of mild LVH. There was focal basal   hypertrophy. Systolic function was normal. The estimated ejection   fraction was in the range of 50% to 55%. Wall motion was normal;   there were no regional wall motion abnormalities. - Mitral valve: There was mild to moderate regurgitation. - Left atrium: The atrium was severely dilated. - Right atrium: The atrium was mildly dilated. - Tricuspid valve: There was moderate regurgitation. - Pulmonary arteries: Systolic pressure was moderately increased.   PA peak pressure: 50 mm Hg (S).  EKG:  EKG is not repeated today.  The last EKG was performed October 06, 2018 and demonstrated atrial fibrillation with rapid ventricular response at 113 bpm and Ashman's phenomenon.  Recent Labs: 10/02/2018: ALT 14 10/03/2018: Hemoglobin 10.0; Platelets 154 11/23/2018: BUN 40; Creatinine, Ser 1.56; NT-Pro BNP 3,049; Potassium 3.5; Sodium 139  Recent Lipid Panel    Component Value Date/Time   CHOL 95 10/03/2018 0521   TRIG 45 10/03/2018 0521   HDL 26 (L) 10/03/2018 0521   CHOLHDL 3.7 10/03/2018 0521   VLDL 9 10/03/2018 0521   LDLCALC 60 10/03/2018 0521    Physical Exam:    VS:  BP (!) 184/84   Pulse 91   Ht 5\' 4"  (1.626 m)   Wt 161 lb (73 kg)   SpO2 99%   BMI 27.64 kg/m     Wt Readings from Last 3 Encounters:  12/18/18 161 lb (73 kg)  11/02/18 170 lb (77.1 kg)  10/03/18 186 lb 8.2 oz (84.6 kg)     GEN: Obese, conversant, appearing younger than stated age.. No acute distress HEENT: Normal NECK: No JVD. LYMPHATICS: No lymphadenopathy CARDIAC: Irregularly irregular RR.  No murmur, gallop, edema VASCULAR: Pulses 2+ and symmetric  in the radial and carotid, Bruits absent bilateral carotid. RESPIRATORY:  Clear to auscultation without rales, wheezing or rhonchi  ABDOMEN: Soft, non-tender, non-distended, No pulsatile mass, MUSCULOSKELETAL: No deformity  SKIN: Warm and dry NEUROLOGIC:  Alert and oriented x 3 PSYCHIATRIC:  Normal affect   ASSESSMENT:    1. Permanent atrial fibrillation   2. Essential hypertension   3. Chronic diastolic HF (heart failure) ([place])   4. Long term current use of anticoagulant therapy   5. TIA (transient ischemic attack)    PLAN:    In  order of problems listed above:  1. Based upon today's exam, rate control is adequate. 2. Blood pressure is very poorly controlled.  Hydralazine has been added, and is not a good long-term solution.  After reviewing her chart, I do not believe she has significant issues with amlodipine to warrant withholding its use as a antihypertensive agent.  Amlodipine 5 mg/day is added.  Target blood pressure 130/80 mmHg.  If after adding amlodipine were not able to achieve the target we will add low-dose ARB therapy and watch kidney function closely.  Low-salt diet is advised. 3. Volume overload has resolved on combination of furosemide and Aldactone. 4. No bleeding complications on Coumadin. 5. No new neurological complaints.  Still struggling to control blood pressure.  The current plan will be to make adjustments and move her regimen into a more contemporary group of medications.  I have added amlodipine today and discontinued hydralazine.  We will next consider switching atenolol to carvedilol, perhaps 25 mg twice daily.  Basic metabolic panel is obtained today.  This will give guidance concerning intensity of diuretic therapy and also whether it would be safe to resume an ARB.  Follow-up in blood pressure clinic in 2 to 4 weeks.    Medication Adjustments/Labs and Tests Ordered: Current medicines are reviewed at length with the patient today.  Concerns regarding  medicines are outlined above.  Orders Placed This Encounter  Procedures  . Basic metabolic panel   Meds ordered this encounter  Medications  . amLODipine (NORVASC) 5 MG tablet    Sig: Take 1 tablet (5 mg total) by mouth daily.    Dispense:  90 tablet    Refill:  3    D/c hydralazine    Patient Instructions  Medication Instructions:  1) DISCONTINUE Hydralazine 2) START Amlodipine 5mg  once daily  If you need a refill on your cardiac medications before your next appointment, please call your pharmacy.   Lab work: BMET today  If you have labs (blood work) drawn today and your tests are completely normal, you will receive your results only by: Marland Kitchen MyChart Message (if you have MyChart) OR . A paper copy in the mail If you have any lab test that is abnormal or we need to change your treatment, we will call you to review the results.  Testing/Procedures: None  Follow-Up: Your physician recommends that you schedule a follow-up appointment in: 2-4 weeks with the Hypertension Clinic.  At [facility], you and your health needs are our priority.  As part of our continuing mission to provide you with exceptional heart care, we have created designated Provider Care Teams.  These Care Teams include your primary Cardiologist (physician) and Advanced Practice Providers (APPs -  Physician Assistants and Nurse Practitioners) who all work together to provide you with the care you need, when you need it. You will need a follow up appointment in 4 months.  Please call our office 2 months in advance to schedule this appointment.  You may see Sinclair Grooms, MD or one of the following Advanced Practice Providers on your designated Care Team:   Truitt Merle, NP Cecilie Kicks, NP . Kathyrn Drown, NP  Any Other Special Instructions Will Be Listed Below (If Applicable).  Please continue to monitor your blood pressure and bring those readings with you to your Hypertension Clinic  appointment.   Low-Sodium Eating Plan Sodium, which is an element that makes up salt, helps you maintain a healthy balance of fluids in your body.  Too much sodium can increase your blood pressure and cause fluid and waste to be held in your body. Your health care provider or dietitian may recommend following this plan if you have high blood pressure (hypertension), kidney disease, liver disease, or heart failure. Eating less sodium can help lower your blood pressure, reduce swelling, and protect your heart, liver, and kidneys. What are tips for following this plan? General guidelines  Most people on this plan should limit their sodium intake to 1,500-2,000 mg (milligrams) of sodium each day. Reading food labels   The Nutrition Facts label lists the amount of sodium in one serving of the food. If you eat more than one serving, you must multiply the listed amount of sodium by the number of servings.  Choose foods with less than 140 mg of sodium per serving.  Avoid foods with 300 mg of sodium or more per serving. Shopping  Look for lower-sodium products, often labeled as "low-sodium" or "no salt added."  Always check the sodium content even if foods are labeled as "unsalted" or "no salt added".  Buy fresh foods. ? Avoid canned foods and premade or frozen meals. ? Avoid canned, cured, or processed meats  Buy breads that have less than 80 mg of sodium per slice. Cooking  Eat more home-cooked food and less restaurant, buffet, and fast food.  Avoid adding salt when cooking. Use salt-free seasonings or herbs instead of table salt or sea salt. Check with your health care provider or pharmacist before using salt substitutes.  Cook with plant-based oils, such as canola, sunflower, or olive oil. Meal planning  When eating at a restaurant, ask that your food be prepared with less salt or no salt, if possible.  Avoid foods that contain MSG (monosodium glutamate). MSG is sometimes added to  [place] food, bouillon, and some canned foods. What foods are recommended? The items listed may not be a complete list. Talk with your dietitian about what dietary choices are best for you. Grains Low-sodium cereals, including oats, puffed wheat and rice, and shredded wheat. Low-sodium crackers. Unsalted rice. Unsalted pasta. Low-sodium bread. Whole-grain breads and whole-grain pasta. Vegetables Fresh or frozen vegetables. "No salt added" canned vegetables. "No salt added" tomato sauce and paste. Low-sodium or reduced-sodium tomato and vegetable juice. Fruits Fresh, frozen, or canned fruit. Fruit juice. Meats and other protein foods Fresh or frozen (no salt added) meat, poultry, seafood, and fish. Low-sodium canned tuna and salmon. Unsalted nuts. Dried peas, beans, and lentils without added salt. Unsalted canned beans. Eggs. Unsalted nut butters. Dairy Milk. Soy milk. Cheese that is naturally low in sodium, such as ricotta cheese, fresh mozzarella, or Swiss cheese Low-sodium or reduced-sodium cheese. Cream cheese. Yogurt. Fats and oils Unsalted butter. Unsalted margarine with no trans fat. Vegetable oils such as canola or olive oils. Seasonings and other foods Fresh and dried herbs and spices. Salt-free seasonings. Low-sodium mustard and ketchup. Sodium-free salad dressing. Sodium-free light mayonnaise. Fresh or refrigerated horseradish. Lemon juice. Vinegar. Homemade, reduced-sodium, or low-sodium soups. Unsalted popcorn and pretzels. Low-salt or salt-free chips. What foods are not recommended? The items listed may not be a complete list. Talk with your dietitian about what dietary choices are best for you. Grains Instant hot cereals. Bread stuffing, pancake, and biscuit mixes. Croutons. Seasoned rice or pasta mixes. Noodle soup cups. Boxed or frozen macaroni and cheese. Regular salted crackers. Self-rising flour. Vegetables Sauerkraut, pickled vegetables, and relishes. Olives. [place] fries.  Onion rings. Regular canned vegetables (not low-sodium or reduced-sodium). Regular  canned tomato sauce and paste (not low-sodium or reduced-sodium). Regular tomato and vegetable juice (not low-sodium or reduced-sodium). Frozen vegetables in sauces. Meats and other protein foods Meat or fish that is salted, canned, smoked, spiced, or pickled. Bacon, ham, sausage, hotdogs, corned beef, chipped beef, packaged lunch meats, salt pork, jerky, pickled herring, anchovies, regular canned tuna, sardines, salted nuts. Dairy Processed cheese and cheese spreads. Cheese curds. Blue cheese. Feta cheese. String cheese. Regular cottage cheese. Buttermilk. Canned milk. Fats and oils Salted butter. Regular margarine. Ghee. Bacon fat. Seasonings and other foods Onion salt, garlic salt, seasoned salt, table salt, and sea salt. Canned and packaged gravies. Worcestershire sauce. Tartar sauce. Barbecue sauce. Teriyaki sauce. Soy sauce, including reduced-sodium. Steak sauce. Fish sauce. Oyster sauce. Cocktail sauce. Horseradish that you find on the shelf. Regular ketchup and mustard. Meat flavorings and tenderizers. Bouillon cubes. Hot sauce and Tabasco sauce. Premade or packaged marinades. Premade or packaged taco seasonings. Relishes. Regular salad dressings. Salsa. Potato and tortilla chips. Corn chips and puffs. Salted popcorn and pretzels. Canned or dried soups. Pizza. Frozen entrees and pot pies. Summary  Eating less sodium can help lower your blood pressure, reduce swelling, and protect your heart, liver, and kidneys.  Most people on this plan should limit their sodium intake to 1,500-2,000 mg (milligrams) of sodium each day.  Canned, boxed, and frozen foods are high in sodium. Restaurant foods, fast foods, and pizza are also very high in sodium. You also get sodium by adding salt to food.  Try to cook at home, eat more fresh fruits and vegetables, and eat less fast food, canned, processed, or prepared foods. This  information is not intended to replace advice given to you by your health care provider. Make sure you discuss any questions you have with your health care provider. Document Released: 05/17/2002 Document Revised: 11/18/2016 Document Reviewed: 11/18/2016 Elsevier Interactive Patient Education  2019 [place], Sinclair Grooms, MD  12/18/2018 11:16 AM    [place]

## 2018-12-18 ENCOUNTER — Encounter: Payer: Self-pay | Admitting: Interventional Cardiology

## 2018-12-18 ENCOUNTER — Other Ambulatory Visit: Payer: Medicare Other

## 2018-12-18 ENCOUNTER — Ambulatory Visit: Payer: Medicare Other | Admitting: Interventional Cardiology

## 2018-12-18 VITALS — BP 184/84 | HR 91 | Ht 64.0 in | Wt 161.0 lb

## 2018-12-18 DIAGNOSIS — G459 Transient cerebral ischemic attack, unspecified: Secondary | ICD-10-CM

## 2018-12-18 DIAGNOSIS — I10 Essential (primary) hypertension: Secondary | ICD-10-CM

## 2018-12-18 DIAGNOSIS — I4821 Permanent atrial fibrillation: Secondary | ICD-10-CM | POA: Diagnosis not present

## 2018-12-18 DIAGNOSIS — Z7901 Long term (current) use of anticoagulants: Secondary | ICD-10-CM

## 2018-12-18 DIAGNOSIS — I5032 Chronic diastolic (congestive) heart failure: Secondary | ICD-10-CM

## 2018-12-18 LAB — BASIC METABOLIC PANEL
BUN/Creatinine Ratio: 25 (ref 12–28)
BUN: 35 mg/dL — ABNORMAL HIGH (ref 8–27)
CALCIUM: 10 mg/dL (ref 8.7–10.3)
CO2: 20 mmol/L (ref 20–29)
Chloride: 102 mmol/L (ref 96–106)
Creatinine, Ser: 1.41 mg/dL — ABNORMAL HIGH (ref 0.57–1.00)
GFR calc Af Amer: 40 mL/min/{1.73_m2} — ABNORMAL LOW (ref >59)
GFR calc non Af Amer: 35 mL/min/{1.73_m2} — ABNORMAL LOW (ref >59)
Glucose: 120 mg/dL — ABNORMAL HIGH (ref 65–99)
Potassium: 3.6 mmol/L (ref 3.5–5.2)
Sodium: 142 mmol/L (ref 134–144)

## 2018-12-18 MED ORDER — AMLODIPINE BESYLATE 5 MG PO TABS: 5.0000 mg | ORAL_TABLET | Freq: Every day | ORAL | 3 refills | Status: DC

## 2018-12-18 NOTE — Patient Instructions (Signed)
Medication Instructions:  1) DISCONTINUE Hydralazine 2) START Amlodipine 5mg  once daily  If you need a refill on your cardiac medications before your next appointment, please call your pharmacy.   Lab work: BMET today  If you have labs (blood work) drawn today and your tests are completely normal, you will receive your results only by: Marland Kitchen MyChart Message (if you have MyChart) OR . A paper copy in the mail If you have any lab test that is abnormal or we need to change your treatment, we will call you to review the results.  Testing/Procedures: None  Follow-Up: Your physician recommends that you schedule a follow-up appointment in: 2-4 weeks with the Hypertension Clinic.  At [facility], you and your health needs are our priority.  As part of our continuing mission to provide you with exceptional heart care, we have created designated Provider Care Teams.  These Care Teams include your primary Cardiologist (physician) and Advanced Practice Providers (APPs -  Physician Assistants and Nurse Practitioners) who all work together to provide you with the care you need, when you need it. You will need a follow up appointment in 4 months.  Please call our office 2 months in advance to schedule this appointment.  You may see Sinclair Grooms, MD or one of the following Advanced Practice Providers on your designated Care Team:   Truitt Merle, NP Cecilie Kicks, NP . Kathyrn Drown, NP  Any Other Special Instructions Will Be Listed Below (If Applicable).  Please continue to monitor your blood pressure and bring those readings with you to your Hypertension Clinic appointment.   Low-Sodium Eating Plan Sodium, which is an element that makes up salt, helps you maintain a healthy balance of fluids in your body. Too much sodium can increase your blood pressure and cause fluid and waste to be held in your body. Your health care provider or dietitian may recommend following this plan if you have high  blood pressure (hypertension), kidney disease, liver disease, or heart failure. Eating less sodium can help lower your blood pressure, reduce swelling, and protect your heart, liver, and kidneys. What are tips for following this plan? General guidelines  Most people on this plan should limit their sodium intake to 1,500-2,000 mg (milligrams) of sodium each day. Reading food labels   The Nutrition Facts label lists the amount of sodium in one serving of the food. If you eat more than one serving, you must multiply the listed amount of sodium by the number of servings.  Choose foods with less than 140 mg of sodium per serving.  Avoid foods with 300 mg of sodium or more per serving. Shopping  Look for lower-sodium products, often labeled as "low-sodium" or "no salt added."  Always check the sodium content even if foods are labeled as "unsalted" or "no salt added".  Buy fresh foods. ? Avoid canned foods and premade or frozen meals. ? Avoid canned, cured, or processed meats  Buy breads that have less than 80 mg of sodium per slice. Cooking  Eat more home-cooked food and less restaurant, buffet, and fast food.  Avoid adding salt when cooking. Use salt-free seasonings or herbs instead of table salt or sea salt. Check with your health care provider or pharmacist before using salt substitutes.  Cook with plant-based oils, such as canola, sunflower, or olive oil. Meal planning  When eating at a restaurant, ask that your food be prepared with less salt or no salt, if possible.  Avoid foods that  contain MSG (monosodium glutamate). MSG is sometimes added to [place] food, bouillon, and some canned foods. What foods are recommended? The items listed may not be a complete list. Talk with your dietitian about what dietary choices are best for you. Grains Low-sodium cereals, including oats, puffed wheat and rice, and shredded wheat. Low-sodium crackers. Unsalted rice. Unsalted pasta. Low-sodium  bread. Whole-grain breads and whole-grain pasta. Vegetables Fresh or frozen vegetables. "No salt added" canned vegetables. "No salt added" tomato sauce and paste. Low-sodium or reduced-sodium tomato and vegetable juice. Fruits Fresh, frozen, or canned fruit. Fruit juice. Meats and other protein foods Fresh or frozen (no salt added) meat, poultry, seafood, and fish. Low-sodium canned tuna and salmon. Unsalted nuts. Dried peas, beans, and lentils without added salt. Unsalted canned beans. Eggs. Unsalted nut butters. Dairy Milk. Soy milk. Cheese that is naturally low in sodium, such as ricotta cheese, fresh mozzarella, or Swiss cheese Low-sodium or reduced-sodium cheese. Cream cheese. Yogurt. Fats and oils Unsalted butter. Unsalted margarine with no trans fat. Vegetable oils such as canola or olive oils. Seasonings and other foods Fresh and dried herbs and spices. Salt-free seasonings. Low-sodium mustard and ketchup. Sodium-free salad dressing. Sodium-free light mayonnaise. Fresh or refrigerated horseradish. Lemon juice. Vinegar. Homemade, reduced-sodium, or low-sodium soups. Unsalted popcorn and pretzels. Low-salt or salt-free chips. What foods are not recommended? The items listed may not be a complete list. Talk with your dietitian about what dietary choices are best for you. Grains Instant hot cereals. Bread stuffing, pancake, and biscuit mixes. Croutons. Seasoned rice or pasta mixes. Noodle soup cups. Boxed or frozen macaroni and cheese. Regular salted crackers. Self-rising flour. Vegetables Sauerkraut, pickled vegetables, and relishes. Olives. [place] fries. Onion rings. Regular canned vegetables (not low-sodium or reduced-sodium). Regular canned tomato sauce and paste (not low-sodium or reduced-sodium). Regular tomato and vegetable juice (not low-sodium or reduced-sodium). Frozen vegetables in sauces. Meats and other protein foods Meat or fish that is salted, canned, smoked, spiced, or  pickled. Bacon, ham, sausage, hotdogs, corned beef, chipped beef, packaged lunch meats, salt pork, jerky, pickled herring, anchovies, regular canned tuna, sardines, salted nuts. Dairy Processed cheese and cheese spreads. Cheese curds. Blue cheese. Feta cheese. String cheese. Regular cottage cheese. Buttermilk. Canned milk. Fats and oils Salted butter. Regular margarine. Ghee. Bacon fat. Seasonings and other foods Onion salt, garlic salt, seasoned salt, table salt, and sea salt. Canned and packaged gravies. Worcestershire sauce. Tartar sauce. Barbecue sauce. Teriyaki sauce. Soy sauce, including reduced-sodium. Steak sauce. Fish sauce. Oyster sauce. Cocktail sauce. Horseradish that you find on the shelf. Regular ketchup and mustard. Meat flavorings and tenderizers. Bouillon cubes. Hot sauce and Tabasco sauce. Premade or packaged marinades. Premade or packaged taco seasonings. Relishes. Regular salad dressings. Salsa. Potato and tortilla chips. Corn chips and puffs. Salted popcorn and pretzels. Canned or dried soups. Pizza. Frozen entrees and pot pies. Summary  Eating less sodium can help lower your blood pressure, reduce swelling, and protect your heart, liver, and kidneys.  Most people on this plan should limit their sodium intake to 1,500-2,000 mg (milligrams) of sodium each day.  Canned, boxed, and frozen foods are high in sodium. Restaurant foods, fast foods, and pizza are also very high in sodium. You also get sodium by adding salt to food.  Try to cook at home, eat more fresh fruits and vegetables, and eat less fast food, canned, processed, or prepared foods. This information is not intended to replace advice given to you by your health care provider. Make sure you  discuss any questions you have with your health care provider. Document Released: 05/17/2002 Document Revised: 11/18/2016 Document Reviewed: 11/18/2016 Elsevier Interactive Patient Education  2019 [employer].

## 2019-01-01 ENCOUNTER — Encounter: Payer: Self-pay | Admitting: Pharmacist

## 2019-01-01 ENCOUNTER — Ambulatory Visit (INDEPENDENT_AMBULATORY_CARE_PROVIDER_SITE_OTHER): Payer: Medicare Other | Admitting: Pharmacist

## 2019-01-01 VITALS — BP 150/80 | HR 77

## 2019-01-01 DIAGNOSIS — I10 Essential (primary) hypertension: Secondary | ICD-10-CM | POA: Diagnosis not present

## 2019-01-01 MED ORDER — CARVEDILOL 25 MG PO TABS: 25.0000 mg | ORAL_TABLET | Freq: Two times a day (BID) | ORAL | 11 refills | Status: DC

## 2019-01-01 NOTE — Patient Instructions (Addendum)
Stop taking atenolol. Start taking carvedilol 25mg  two times a day tomorrow morning.  Continue taking furosemide 60mg  daily, spironolactone 25mg  Monday, Wed and Friday, amlodipine 5mg  daily  Continue to check your blood pressure at home and bring your readings with you to your next appointment.

## 2019-01-01 NOTE — Progress Notes (Signed)
Patient ID: Jasmine [name]                 DOB: 09-Jun-1934                      MRN: [id number]     HPI: Jasmine [name] is a 85 y.o. female referred by Dr. Tamala Julian to HTN clinic. PMH is significant for afib, diastolic heart failure, DVT, HLD.  At last visit with Dr. Tamala Julian on 12/18/18 her blood pressure was 184/84. Dr. Tamala Julian stopped her hydralazine as he felt it was not a good long term solution. She was started on amlodipine 5mg  since she could not remember her "allergy" to it. Her fluid status has improved with furosemide 60mg  daily and spironolactone 25mg  MWF. Serum creatine relatively stable.   Patient is accompanied today by her sister. She denies dizziness, lightheadedness, headaches. States she has some blurred vision, but she wears glasses and is due for an eye check.  Patient monitors her blood pressure at home. She has an arm cuff- [employer]ON brand. She keeps very good records.   Patient jokes that she is our lab rat to experiment on. Explained to her that everything we do here is based off of evidence. She appears willing to comply with medication changes.  Patient states he swelling is much better. Notices just a little in her left foot.   Current HTN meds: spironolactone 25mg  MWF, amlodipine 5mg  daily, atenolol 100mg  daily, furosemide 60mg  daily Previously tried: irbesartan 300mg  daily (PCP took her off of it) BP goal: <130/80  Family History: The patient's family history includes Diabetes in her mother; Hypertension in her sister; Pulmonary embolism in her sister.  Social History:   Diet: patient does not cook with salt  Exercise: unable to exercise at this time.  Home BP readings: 148/90, 141/68, 149/73, 148/65, 156/65, 153/66, 161/75, 168/77, 148/79 (this AM) HR 52-82  Wt Readings from Last 3 Encounters:  12/18/18 161 lb (73 kg)  11/02/18 170 lb (77.1 kg)  10/03/18 186 lb 8.2 oz (84.6 kg)   BP Readings from Last 3 Encounters:  12/18/18 (!) 184/84  12/16/18 (!) 168/75    11/02/18 (!) 172/90   Pulse Readings from Last 3 Encounters:  12/18/18 91  11/02/18 89  10/03/18 71    Renal function: Estimated Creatinine Clearance: 30.1 mL/min (A) (by C-G formula based on SCr of 1.41 mg/dL (H)).  Past Medical History:  Diagnosis Date  . Allergic rhinitis due to pollen   . Atrial fibrillation with rapid ventricular response ([place])   . Congestive heart failure, unspecified    patient states she is not aware of this  . Dizziness   . DVT (deep venous thrombosis) (HCC) left leg  . Essential hypertension, malignant   . GERD (gastroesophageal reflux disease)   . Heart murmur   . Insulin resistance    History of insulin resistance.  . Insulin resistance   . Obesity   . Pure hypercholesterolemia   . Vertigo     Current Outpatient Medications on File Prior to Visit  Medication Sig Dispense Refill  . amLODipine (NORVASC) 5 MG tablet Take 1 tablet (5 mg total) by mouth daily. 90 tablet 3  . atenolol (TENORMIN) 100 MG tablet Take 100 mg by mouth daily.  3  . colchicine 0.6 MG tablet Take 0.6 mg by mouth daily.  2  . furosemide (LASIX) 40 MG tablet Take 1.5 tablets (60 mg total) by mouth daily. 135 tablet 3  .  meclizine (ANTIVERT) 25 MG tablet Take 25 mg by mouth 3 (three) times daily as needed for dizziness.   1  . spironolactone (ALDACTONE) 25 MG tablet Take one tablet by mouth on Monday, Wednesday and Friday 45 tablet 3  . warfarin (COUMADIN) 1 MG tablet Take 1 mg by mouth daily.      No current facility-administered medications on file prior to visit.     Allergies  Allergen Reactions  . Codeine   . Lovenox [Enoxaparin Sodium]   . Norvasc [Amlodipine Besylate]   . Sulfa Antibiotics   . Latex Rash    There were no vitals taken for this visit.   Assessment/Plan:  1. Hypertension - Blood pressure is not at goal of <130/80. Had a discussion with patient about treatment options. Will change atenolol 100mg  to carvedilol 25mg  BID which should provide  better blood pressure lowering than atenolol. Continue spironolactone 25mg  MWF, amlodipine 5mg  daily and furosemide 60mg  daily. Will bring patient back in 2 weeks for another blood pressure check. I anticipate that her blood pressure will still be above goal, however I do not want to make too many changes at once. If BP still elevated, will consider adding back an ARB.    Thank you  Ramond Dial, Pharm.D, [facility]  [address], [place], [place] [address]  Phone: [phone]; Fax: [phone]

## 2019-01-06 NOTE — Progress Notes (Signed)
Subjective: Jasmine [name] presents today referred by Jasmine Blocker, MD with cc of painful, discolored, thick toenails which have been present for several weeks.  Her pain does interfere with daily activities.  Pain is aggravated when wearing enclosed shoe gear.  Jasmine [name] relates no history of foot wounds.  She does relate history of ankle fracture of the right ankle for which she underwent ORIF.  She also relates being on blood thinner Coumadin. Patient has h/o afib and DVT.   Past Medical History:  Diagnosis Date  . Allergic rhinitis due to pollen   . Atrial fibrillation with rapid ventricular response ([place])   . Congestive heart failure, unspecified    patient states she is not aware of this  . Dizziness   . DVT (deep venous thrombosis) (HCC) left leg  . Essential hypertension, malignant   . GERD (gastroesophageal reflux disease)   . Heart murmur   . Insulin resistance    History of insulin resistance.  . Insulin resistance   . Obesity   . Pure hypercholesterolemia   . Vertigo      Patient Active Problem List   Diagnosis Date Noted  . TIA (transient ischemic attack) 10/02/2018  . Long term current use of anticoagulant therapy 02/08/2014  . Chronic diastolic HF (heart failure) ([place]) 11/21/2012  . Insulin resistance 11/21/2012  . Deep venous thrombosis of lower extremity ([place]) 06/02/2012  . Hypertension 01/07/2012  . Atrial fibrillation ([place]) 12/23/2011     Past Surgical History:  Procedure Laterality Date  . APPENDECTOMY    . BREAST BIOPSY    . CHOLECYSTECTOMY    . PARTIAL HYSTERECTOMY        Current Outpatient Medications:  .  colchicine 0.6 MG tablet, Take 0.6 mg by mouth daily., Disp: , Rfl: 2 .  furosemide (LASIX) 40 MG tablet, Take 1.5 tablets (60 mg total) by mouth daily., Disp: 135 tablet, Rfl: 3 .  meclizine (ANTIVERT) 25 MG tablet, Take 25 mg by mouth 3 (three) times daily as needed for dizziness. , Disp: , Rfl: 1 .  spironolactone (ALDACTONE) 25 MG  tablet, Take one tablet by mouth on Monday, Wednesday and Friday, Disp: 45 tablet, Rfl: 3 .  warfarin (COUMADIN) 1 MG tablet, Take 1 mg by mouth daily. , Disp: , Rfl:  .  amLODipine (NORVASC) 5 MG tablet, Take 1 tablet (5 mg total) by mouth daily., Disp: 90 tablet, Rfl: 3 .  carvedilol (COREG) 25 MG tablet, Take 1 tablet (25 mg total) by mouth 2 (two) times daily., Disp: 60 tablet, Rfl: 11   Allergies  Allergen Reactions  . Codeine   . Lovenox [Enoxaparin Sodium]   . Norvasc [Amlodipine Besylate]   . Sulfa Antibiotics   . Latex Rash     Social History   Occupational History  . Not on file  Tobacco Use  . Smoking status: Never Smoker  . Smokeless tobacco: Never Used  Substance and Sexual Activity  . Alcohol use: No  . Drug use: No  . Sexual activity: Not Currently    Birth control/protection: Post-menopausal     Family History  Problem Relation Age of Onset  . Diabetes Mother   . Hypertension Sister   . Pulmonary embolism Sister      Immunization History  Administered Date(s) Administered  . Influenza Whole 09/29/2012     Review of systems: Positive Findings in bold print.  Constitutional:  chills, fatigue, fever, sweats, weight change Communication: Optometrist, sign Ecologist, hand writing,  iPad/Android device Head: headaches, head injury Eyes: changes in vision, eye pain, glaucoma, cataracts, macular degeneration, diplopia, glare,  light sensitivity, eyeglasses or contacts, blindness Ears nose mouth throat: Hard of hearing, ringing in ears, deaf, sign language,  vertigo,   nosebleeds,  rhinitis,  cold sores, snoring, swollen glands Cardiovascular: HTN, edema, arrhythmia, pacemaker in place, defibrillator in place,  chest pain/tightness, chronic anticoagulation, blood clot, heart failure Peripheral Vascular: leg cramps, varicose veins, blood clots, lymphedema, varicose veins Respiratory:  difficulty breathing, denies congestion, SOB, wheezing, cough,  emphysema Gastrointestinal: change in appetite or weight, abdominal pain, constipation, diarrhea, nausea, vomiting, vomiting blood, change in bowel habits, abdominal pain, jaundice, rectal bleeding, hemorrhoids, Genitourinary:  nocturia,  pain on urination,  blood in urine, Foley catheter, urinary urgency Musculoskeletal: uses mobility aid,  cramping, stiff joints, painful joints, decreased joint motion, fractures, OA, gout Skin: +changes in toenails, color change, dryness, itching, mole changes,  rash  Neurological: headaches, numbness in feet, paresthesias in feet, burning in feet, fainting,  seizures, change in speech. denies headaches, memory problems/poor historian, cerebral palsy, weakness, paralysis Endocrine: diabetes, hypothyroidism, hyperthyroidism,  goiter, dry mouth, flushing, heat intolerance,  cold intolerance,  excessive thirst, denies polyuria,  Nocturia, insulin resistance Hematological:  easy bleeding, excessive bleeding, easy bruising, enlarged lymph nodes, on long term blood thinner, history of past transusions Allergy/immunological:  hives, eczema, frequent infections, multiple drug allergies, seasonal allergies, transplant recipient Psychiatric:  anxiety, depression, mood disorder, suicidal ideations, hallucinations   Objective: Vascular Examination: Capillary refill time immediate x 10 digits   Dorsalis pedis is faintly palpable on the right and nonpalpable on the left  Posterior tibial pulses faintly palpable bilaterally  No digital hair x 10 digits  Skin temperature gradient WNL b/l  Edema noted bilateral foot and ankles  Dependent rubor present BLE  Dermatological Examination: Skin with normal turgor, texture and tone b/l   Toenails 1-5 b/l discolored, thick, dystrophic with subungual debris and pain with palpation to nailbeds due to thickness of nails.  Incurvated toenail noted left great toe medial border with tenderness to palpation.  There is no erythema,  no edema, no drainage, no flocculence noted.  Hyperkeratotic lesion submetatarsal head 1, 5 right foot  Hyperkeratotic lesion lesion noted submetatarsal head 5 left.    Hyperkeratotic lesion noted dorsal aspect of the right fifth digit PIPJ  Musculoskeletal: Muscle strength 5/5 to all LE muscle groups  Hammertoes 2 through 5 bilaterally  Neurological: Sensation intact with 10 gram monofilament Vibratory sensation intact.  Assessment: 1. Painful onychomycosis toenails 1-5 b/l  2. Corns and callosities submetatarsal head 1, 5 right foot, submetatarsal head 5 left, dorsal aspect of the right fifth digit PIPJ 3. PAD    Plan: 1. Discussed onychomycosis and treatment options.  Literature dispensed on today. 2. Toenails 1-5 b/l were debrided in length and girth without iatrogenic bleeding. 4. Corns and callosities pared without incident submetatarsal head 1, 5 right foot, submetatarsal head 5 left, dorsal aspect of the right fifth digit PIPJ 3. Patient to continue soft, supportive shoe gear 4. Patient to report any pedal injuries to medical professional immediately. 5. Follow up 3 months.  6. Patient/POA to call should there be a concern in the interim.

## 2019-01-07 ENCOUNTER — Encounter: Payer: Self-pay | Admitting: Podiatry

## 2019-01-15 ENCOUNTER — Ambulatory Visit: Payer: Medicare Other

## 2019-01-19 ENCOUNTER — Ambulatory Visit: Payer: Medicare Other

## 2019-01-21 ENCOUNTER — Ambulatory Visit (INDEPENDENT_AMBULATORY_CARE_PROVIDER_SITE_OTHER): Payer: Medicare Other | Admitting: Pharmacist

## 2019-01-21 ENCOUNTER — Encounter (INDEPENDENT_AMBULATORY_CARE_PROVIDER_SITE_OTHER): Payer: Self-pay

## 2019-01-21 ENCOUNTER — Encounter: Payer: Self-pay | Admitting: Pharmacist

## 2019-01-21 DIAGNOSIS — I10 Essential (primary) hypertension: Secondary | ICD-10-CM

## 2019-01-21 NOTE — Progress Notes (Signed)
Patient ID: Jasmine [name]                 DOB: 06/28/1934                      MRN: [id number]     HPI: Jasmine [name] is a 85 y.o. female referred by Dr. Tamala Julian to HTN clinic. PMH is significant for afib, diastolic heart failure, DVT, HLD.  At last visit with the HTN clinic her blood pressure was 150/80. Her atenolol was changed to carvedilol 25mg  twice a day for better blood pressure lowering.   Patient presents today in good spirits. She is accompanied by her sister. She denies dizziness, lightheadedness, blurred vision and SOB. She states that she gets very sleepy after taking her medications. She thinks it is from the new medication (carvedilol). She also notes some swelling in her ankles, but denies worsening since last visit. She also states she has some headaches, but thinks they are sinus related.   Patient monitors her blood pressure at home. She has an arm cuff- [employer]ON brand. She keeps very good records.   Patient and sister express concern that we do not have access to PCP notes and visa versa. States that she gets confused sometimes with all the changes. Also expressed frustration and embarrassment that she does not know why Avapro was stopped. States at one time she got a call from a kidney doctor about setting up an appointment but was never told by her PCP anything about it.   Current HTN meds: spironolactone 25mg  MWF, amlodipine 5mg  daily, carvedilol 25mg  twice a day, furosemide 60mg  daily Previously tried: irbesartan 300mg  daily (PCP took her off of it) BP goal: <130/80  Family History: The patient's family history includes Diabetes in her mother; Hypertension in her sister; Pulmonary embolism in her sister.  Social History:   Diet: patient does not cook with salt  Exercise: unable to exercise at this time.  Home BP readings: 132/91. 144/79,143/83, 148/69, 151/86, 160/78, 147/81, 138/75, 163/82, 152/78, 129/72, 151/80, 149/93 HR 57-84  Wt Readings from Last 3  Encounters:  12/18/18 161 lb (73 kg)  11/02/18 170 lb (77.1 kg)  10/03/18 186 lb 8.2 oz (84.6 kg)   BP Readings from Last 3 Encounters:  01/01/19 (!) 150/80  12/18/18 (!) 184/84  12/16/18 (!) 168/75   Pulse Readings from Last 3 Encounters:  01/01/19 77  12/18/18 91  11/02/18 89    Renal function: CrCl cannot be calculated (Patient's most recent lab result is older than the maximum 21 days allowed.).  Past Medical History:  Diagnosis Date  . Allergic rhinitis due to pollen   . Atrial fibrillation with rapid ventricular response ([place])   . Congestive heart failure, unspecified    patient states she is not aware of this  . Dizziness   . DVT (deep venous thrombosis) (H[facility]) left leg  . Essential hypertension, malignant   . GERD (gastroesophageal reflux disease)   . Heart murmur   . Insulin resistance    History of insulin resistance.  . Insulin resistance   . Obesity   . Pure hypercholesterolemia   . Vertigo     Current Outpatient Medications on File Prior to Visit  Medication Sig Dispense Refill  . amLODipine (NORVASC) 5 MG tablet Take 1 tablet (5 mg total) by mouth daily. 90 tablet 3  . carvedilol (COREG) 25 MG tablet Take 1 tablet (25 mg total) by mouth 2 (two) times daily. [place]  tablet 11  . colchicine 0.6 MG tablet Take 0.6 mg by mouth daily.  2  . furosemide (LASIX) 40 MG tablet Take 1.5 tablets (60 mg total) by mouth daily. 135 tablet 3  . meclizine (ANTIVERT) 25 MG tablet Take 25 mg by mouth 3 (three) times daily as needed for dizziness.   1  . spironolactone (ALDACTONE) 25 MG tablet Take one tablet by mouth on Monday, Wednesday and Friday 45 tablet 3  . warfarin (COUMADIN) 1 MG tablet Take 1 mg by mouth daily.      No current facility-administered medications on file prior to visit.     Allergies  Allergen Reactions  . Codeine   . Lovenox [Enoxaparin Sodium]   . Norvasc [Amlodipine Besylate]   . Sulfa Antibiotics   . Latex Rash    There were no vitals taken  for this visit.   Assessment/Plan:  1. Hypertension -  Blood pressure above goal of <130/80. Will decreased carvedilol to 12.5mg  twice a day due to increased fatigue. Will see if this helps. I do not want to increase amlodipine due to swelling. Patient had existing swelling prior to starting amlodipine, therefore I do not think it needs to be stopped, but concerned if we increase the dose may potentially worsen edema. Patients renal function and K look stable and I feel as though restarting ARB is the best option. However patient would prefer I find out the exact reason why Avapro was stopped before restarting. I will contact PCP office to see why it was stopped.   Thank you  Ramond Dial, Pharm.D, [facility]  [address], [place], [place] [address]  Phone: [phone]; Fax: [phone]

## 2019-01-21 NOTE — Patient Instructions (Signed)
Decrease carvedilol to 12.5mg  every 12 hours.  I will contact Dr. Marlou Sa office about why Avapro was stopped. I will also send our notes over to his office.  I will call you when I find out the answer.   Call us at [phone] with any questions or concerns.

## 2019-01-26 ENCOUNTER — Telehealth: Payer: Self-pay | Admitting: Pharmacist

## 2019-01-26 NOTE — Telephone Encounter (Signed)
Received voicemail from Dr. Marlou Sa- patient scr was up to 1.86 crcl 5ml/min when Irbasartan was d/c in September. Patient's renal function has improved, around 1.4. I think it is reasonable to resume irbasartan at low dose 75mg  daily and watch renal function closely as mediation is titrated.. Will forward to Dr. Tamala Julian for his opinion.  Somewhat limited in options as we cannot increase the dose of amldodipine, she did not tolerate higher dose of carvedilol and chlorthalidone also effects kidney function and is less effective at lower creatine clearance. Would try to avoid hydralazine if possible due to multiple doses per day.

## 2019-01-26 NOTE — Telephone Encounter (Signed)
Left message at [name] office to figure out why patient Jasmine [name] was d/c per patient request

## 2019-01-27 NOTE — Telephone Encounter (Signed)
Resume irbsartan

## 2019-01-28 MED ORDER — IRBESARTAN 75 MG PO TABS: 75.0000 mg | ORAL_TABLET | Freq: Every day | ORAL | 11 refills | Status: DC

## 2019-01-28 NOTE — Telephone Encounter (Signed)
Called patient and let her know that I discussed with Dr. Tamala Julian and Dr. Marlou Sa about resuming irbastatan. Will restart at low dose 75mg  daily and check lab work at next appointment on 3/17. Patient argeeable to plan  States that she is feeling better, less sleepy, with the lower dose of carvedilol. Complains of some swelling, but states that it improves with elevation on feet.

## 2019-02-23 ENCOUNTER — Other Ambulatory Visit: Payer: Self-pay

## 2019-02-23 ENCOUNTER — Ambulatory Visit (INDEPENDENT_AMBULATORY_CARE_PROVIDER_SITE_OTHER): Payer: Medicare Other | Admitting: Pharmacist

## 2019-02-23 ENCOUNTER — Encounter: Payer: Self-pay | Admitting: Pharmacist

## 2019-02-23 VITALS — BP 170/70 | HR 97

## 2019-02-23 DIAGNOSIS — I10 Essential (primary) hypertension: Secondary | ICD-10-CM | POA: Diagnosis not present

## 2019-02-23 LAB — BASIC METABOLIC PANEL
BUN/Creatinine Ratio: 27 (ref 12–28)
BUN: 43 mg/dL — ABNORMAL HIGH (ref 8–27)
CALCIUM: 10.2 mg/dL (ref 8.7–10.3)
CO2: 19 mmol/L — ABNORMAL LOW (ref 20–29)
Chloride: 97 mmol/L (ref 96–106)
Creatinine, Ser: 1.6 mg/dL — ABNORMAL HIGH (ref 0.57–1.00)
GFR calc Af Amer: 34 mL/min/{1.73_m2} — ABNORMAL LOW (ref >59)
GFR calc non Af Amer: 30 mL/min/{1.73_m2} — ABNORMAL LOW (ref >59)
Glucose: 149 mg/dL — ABNORMAL HIGH (ref 65–99)
Potassium: 4.5 mmol/L (ref 3.5–5.2)
Sodium: 138 mmol/L (ref 134–144)

## 2019-02-23 MED ORDER — ATENOLOL 50 MG PO TABS: 50.0000 mg | ORAL_TABLET | Freq: Every day | ORAL | 11 refills | Status: DC

## 2019-02-23 NOTE — Patient Instructions (Addendum)
It was a pleasure to see you again today.  Start taking atenolol 50mg  daily in the morning. Do not take carvedilol.  Continue taking spironolactone 25mg  on Monday, Wednesday and Friday in the morning Take furosemide 60mg  daily in the morning Take Avapro (irbasartan) 75mg  daily in the evening Take amlodipine 5mg  daily in the evening  Continue checking blood pressure at home.  Call us at [phone] with any questions or concerns.

## 2019-02-23 NOTE — Progress Notes (Signed)
Patient ID: Jasmine [name]                 DOB: [birth year]                      MRN: [id number]     HPI: SHAMS FILL is a 85 y.o. female referred by Dr. Tamala Julian to HTN clinic. PMH is significant for afib, diastolic heart failure, DVT, HLD.  At last visit with the HTN clinic her blood pressure was 150/82. Her carvedilol was decreased to 12.5mg  twice a day due to fatigue. She was also started on irbesartan 75mg  daily with close monitoring after discussion with Dr. Marlou Sa and Dr. Tamala Julian. Patient will get a BMP drawn today.  Patient denies dizziness, lightheadedness, blurred vision and SOB. States she occasionally has a slight headache in the mornings.  Patient states that she stopped taking carvedilol as she felt sleepy after eating breakfast and it upset her stomach. Patient previously told me on the phone that her fatigue had improved with lower dose. Patient expresses frustration with all the changes and that all of the sudden "everything is happening to me at 34"  Patient monitors her blood pressure at home. She has an arm cuff- [employer]ON brand. She keeps very good records.   Current HTN meds: spironolactone 25mg  MWF, amlodipine 5mg  daily, furosemide 60mg  daily, irbesartan 75mg  daily Previously tried: irbesartan 300mg  daily (PCP took her off of it) BP goal: <130/80  Family History: The patient's family history includes Diabetes in her mother; Hypertension in her sister; Pulmonary embolism in her sister.  Social History:   Diet: patient does not cook with salt  Exercise: unable to exercise at this time.  Home BP readings:  130/72, 155/81, 156/78, 156/78, 164/79, 167/83, 149/77, 153/83, 149/77, 141/82, 152/90, 150/95, 146/77, 140/80, 142/8/0,135/72, 162/89, 169/76, 139/76 HR 64-81  Wt Readings from Last 3 Encounters:  12/18/18 161 lb (73 kg)  11/02/18 170 lb (77.1 kg)  10/03/18 186 lb 8.2 oz (84.6 kg)   BP Readings from Last 3 Encounters:  01/21/19 (!) 150/82  01/01/19 (!) 150/80   12/18/18 (!) 184/84   Pulse Readings from Last 3 Encounters:  01/21/19 62  01/01/19 77  12/18/18 91    Renal function: CrCl cannot be calculated (Patient's most recent lab result is older than the maximum 21 days allowed.).  Past Medical History:  Diagnosis Date  . Allergic rhinitis due to pollen   . Atrial fibrillation with rapid ventricular response ([place])   . Congestive heart failure, unspecified    patient states she is not aware of this  . Dizziness   . DVT (deep venous thrombosis) ([facility]) left leg  . Essential hypertension, malignant   . GERD (gastroesophageal reflux disease)   . Heart murmur   . Insulin resistance    History of insulin resistance.  . Insulin resistance   . Obesity   . Pure hypercholesterolemia   . Vertigo     Current Outpatient Medications on File Prior to Visit  Medication Sig Dispense Refill  . amLODipine (NORVASC) 5 MG tablet Take 1 tablet (5 mg total) by mouth daily. 90 tablet 3  . carvedilol (COREG) 25 MG tablet Take 0.5 tablets (12.5 mg total) by mouth 2 (two) times daily with a meal. 60 tablet 11  . colchicine 0.6 MG tablet Take 0.6 mg by mouth daily.  2  . furosemide (LASIX) 40 MG tablet Take 1.5 tablets (60 mg total) by mouth daily. 135 tablet 3  .  irbesartan (AVAPRO) 75 MG tablet Take 1 tablet (75 mg total) by mouth daily. 30 tablet 11  . meclizine (ANTIVERT) 25 MG tablet Take 25 mg by mouth 3 (three) times daily as needed for dizziness.   1  . spironolactone (ALDACTONE) 25 MG tablet Take one tablet by mouth on Monday, Wednesday and Friday 45 tablet 3  . warfarin (COUMADIN) 1 MG tablet Take 1 mg by mouth daily.      No current facility-administered medications on file prior to visit.     Allergies  Allergen Reactions  . Codeine   . Lovenox [Enoxaparin Sodium]   . Norvasc [Amlodipine Besylate]   . Sulfa Antibiotics   . Latex Rash    There were no vitals taken for this visit.   Assessment/Plan:  1. Hypertension - Blood pressure  not at goal of <130/80. Concerned with patients history of afib being off of beta blocker for rate control. I will resume atenolol, which she was previously on and tolerated well. Will start atenolol 50mg  daily (previously on 100mg  daily). Checking BMP today due to starting irbesartan 75mg  daily. Discussed proper blood pressure technique and taking blood pressure 2 hours after morning meds. Also discussed taking diuretics in the morning and taking amlodipine and irbsartan in the evening to help with elevated AM blood pressures. Follow up in clinic in 4 weeks.    Thank you  Ramond Dial, Pharm.D, [facility]  [address], [place], [place] [address]  Phone: [phone]; Fax: [phone]

## 2019-02-25 ENCOUNTER — Telehealth: Payer: Self-pay | Admitting: Pharmacist

## 2019-02-25 NOTE — Telephone Encounter (Signed)
Dr. Luiz Ochoa do you think about decreasing lasix to 40mg  daily? And instructions to take an extra 20mg  for swelling?  Called patient and let her know that her kidney function is stable compared to the a few months ago. Continue Avapro 75mg  daily, atenolol 50mg , amlodipine 5mg , spironolactone 25mg  MWF and furosemide 60mg  daily.  There was a slight bump in scr after starting Avapro (almost to be expected and <30%).  BUN/scr ratio trending up. Possibly that patient could be a little over diuresed. Patient states that she doesn't have any swelling.

## 2019-02-25 NOTE — Telephone Encounter (Signed)
Okay to decrease furosemide intensity as long as dyspnea does not recur.

## 2019-02-26 NOTE — Telephone Encounter (Signed)
Spoke with patient and instructed her to decrease her furosemide 40mg  daily, but if she has shortness of breath or increased swelling to take an extra 20mg  tablet.

## 2019-03-17 ENCOUNTER — Other Ambulatory Visit: Payer: Self-pay

## 2019-03-17 ENCOUNTER — Ambulatory Visit: Payer: Medicare Other | Admitting: Podiatry

## 2019-03-17 VITALS — Temp 97.3°F

## 2019-03-17 DIAGNOSIS — L84 Corns and callosities: Secondary | ICD-10-CM

## 2019-03-17 DIAGNOSIS — I739 Peripheral vascular disease, unspecified: Secondary | ICD-10-CM | POA: Diagnosis not present

## 2019-03-17 DIAGNOSIS — B351 Tinea unguium: Secondary | ICD-10-CM | POA: Diagnosis not present

## 2019-03-17 DIAGNOSIS — M79674 Pain in right toe(s): Secondary | ICD-10-CM | POA: Diagnosis not present

## 2019-03-17 DIAGNOSIS — M79675 Pain in left toe(s): Secondary | ICD-10-CM

## 2019-03-18 ENCOUNTER — Telehealth: Payer: Self-pay | Admitting: Pharmacist

## 2019-03-18 ENCOUNTER — Encounter: Payer: Self-pay | Admitting: Podiatry

## 2019-03-18 NOTE — Progress Notes (Signed)
Subjective: Jasmine [name] is a 85 y.o. y.o. female who is on long term blood thinner Coumadin and presents today with painful, discolored, thick toenails which interfere with daily activities. Pain is aggravated when wearing enclosed shoe gear. Pain is relieved with periodic professional debridement.  Pt also presents with painful callus formation noted b/l feet.  Jasmine Blocker, MD is her PCP.    Current Outpatient Medications:  .  amLODipine (NORVASC) 5 MG tablet, Take 1 tablet (5 mg total) by mouth daily., Disp: 90 tablet, Rfl: 3 .  atenolol (TENORMIN) 50 MG tablet, Take 1 tablet (50 mg total) by mouth daily., Disp: 30 tablet, Rfl: 11 .  colchicine 0.6 MG tablet, Take 0.6 mg by mouth daily., Disp: , Rfl: 2 .  furosemide (LASIX) 40 MG tablet, Take 1.5 tablets (60 mg total) by mouth daily., Disp: 135 tablet, Rfl: 3 .  irbesartan (AVAPRO) 75 MG tablet, Take 1 tablet (75 mg total) by mouth daily., Disp: 30 tablet, Rfl: 11 .  meclizine (ANTIVERT) 25 MG tablet, Take 25 mg by mouth 3 (three) times daily as needed for dizziness. , Disp: , Rfl: 1 .  spironolactone (ALDACTONE) 25 MG tablet, Take one tablet by mouth on Monday, Wednesday and Friday, Disp: 45 tablet, Rfl: 3 .  warfarin (COUMADIN) 1 MG tablet, Take 1 mg by mouth daily. , Disp: , Rfl:    Allergies  Allergen Reactions  . Codeine   . Lovenox [Enoxaparin Sodium]   . Norvasc [Amlodipine Besylate]   . Sulfa Antibiotics   . Latex Rash     Objective: Vascular Examination: Capillary refill time <3 seconds x 10 digits  Dorsalis pedis pulses faontly palpable on the right; nonpalpable on the left  Posterior tibial pulses faintly palpable b/l.  No digital hair x 10 digits  Skin temperature gradient WNL b/l.  Dependent rubor b/l LE.  Dermatological Examination: Skin with normal turgor, texture and tone b/l.  Toenails 1-5 b/l discolored, thick, dystrophic with subungual debris and pain with palpation to nailbeds due to thickness  of nails.  Hyperkeratotic lesion submetatarsal head 1 left foot with small blood blister with light drainage. No erythema, no edema, no drainage, no flocculence noted.   Musculoskeletal: Muscle strength 5/5 to all LE muscle groups .  Hammertoe 2-5 b/l  Wearing black Karle Starch style shoes with soft, stretchable uppers.  Neurological: Sensation intact with 10 gram monofilament.  Vibratory sensation intact.  Assessment: Painful onychomycosis toenails 1-5 b/l in patient on blood thinner.  Corn dorsal PIPJ right 5th digit PAD  Plan: 1. Toenails 1-5 b/l were debrided in length and girth without iatrogenic bleeding. 2. Hyperkeratotic lesion(s) right 5th digit corn, submet head 1 left foot debrided utilizing sterile scalpel blade. Lumicain utilized to address light bleeding from blood blister. Triple antibiotic ointment and bandaid applied. Patient instructed to apply Polysporin Ointment once daily for one week.  3. Left shoe offlloaded for callus submet head 1 left foot.    4. Patient to continue soft, supportive shoe gear daily. 5. Patient to report any pedal injuries to medical professional immediately. 6. Avoid self trimming due to use of blood thinner. 7. Follow up 3 months. 8. Patient/POA to call should there be a concern in the interim.

## 2019-03-18 NOTE — Telephone Encounter (Signed)
Spoke with patient who reports that her blood pressures have been doing fairly well over the last few weeks, running in 130s/80s. She denies any dizziness, SOB, blurred vision. She states rarely she does have some very mild swelling that is improved with propping feet up.   Advised she continue on current regimen and call with any concerns will likely want to get repeat BMET when safe to do so from [facility]. Will add to list for follow up labs and visit if needed.

## 2019-03-23 ENCOUNTER — Ambulatory Visit: Payer: Medicare Other

## 2019-04-09 ENCOUNTER — Telehealth: Payer: Self-pay | Admitting: Interventional Cardiology

## 2019-04-09 NOTE — Telephone Encounter (Signed)
Spoke with pt and she is agreeable to phone visit.  States unable to do video visit.  Went over consent.  Pt agreeable.      Virtual Visit Pre-Appointment Phone Call  "(Name), I am calling you today to discuss your upcoming appointment. We are currently trying to limit exposure to the virus that causes COVID-19 by seeing patients at home rather than in the office."  1. "What is the BEST phone number to call the day of the visit?" - include this in appointment notes  2. "Do you have or have access to (through a family member/friend) a smartphone with video capability that we can use for your visit?" a. If yes - list this number in appt notes as "cell" (if different from BEST phone #) and list the appointment type as a VIDEO visit in appointment notes b. If no - list the appointment type as a PHONE visit in appointment notes  3. Confirm consent - "In the setting of the current Covid19 crisis, you are scheduled for a (phone or video) visit with your provider on (date) at (time).  Just as we do with many in-office visits, in order for you to participate in this visit, we must obtain consent.  If you'd like, I can send this to your mychart (if signed up) or email for you to review.  Otherwise, I can obtain your verbal consent now.  All virtual visits are billed to your insurance company just like a normal visit would be.  By agreeing to a virtual visit, we'd like you to understand that the technology does not allow for your provider to perform an examination, and thus may limit your provider's ability to fully assess your condition. If your provider identifies any concerns that need to be evaluated in person, we will make arrangements to do so.  Finally, though the technology is pretty good, we cannot assure that it will always work on either your or our end, and in the setting of a video visit, we may have to convert it to a phone-only visit.  In either situation, we cannot ensure that we have a secure  connection.  Are you willing to proceed?" STAFF: Did the patient verbally acknowledge consent to telehealth visit? Document YES/NO here: yes  4. Advise patient to be prepared - "Two hours prior to your appointment, go ahead and check your blood pressure, pulse, oxygen saturation, and your weight (if you have the equipment to check those) and write them all down. When your visit starts, your provider will ask you for this information. If you have an Apple Watch or Kardia device, please plan to have heart rate information ready on the day of your appointment. Please have a pen and paper handy nearby the day of the visit as well."  5. Give patient instructions for MyChart download to smartphone OR Doximity/Doxy.me as below if video visit (depending on what platform provider is using)  6. Inform patient they will receive a phone call 15 minutes prior to their appointment time (may be from unknown caller ID) so they should be prepared to answer    TELEPHONE CALL NOTE  Jasmine [name] has been deemed a candidate for a follow-up tele-health visit to limit community exposure during the Covid-19 pandemic. I spoke with the patient via phone to ensure availability of phone/video source, confirm preferred email & phone number, and discuss instructions and expectations.  I reminded Jasmine [name] to be prepared with any vital sign and/or heart rhythm  information that could potentially be obtained via home monitoring, at the time of her visit. I reminded Jasmine [name] to expect a phone call prior to her visit.  Bowers,Jennifer L, LPN 06/13/2262 3:35 PM   INSTRUCTIONS FOR DOWNLOADING THE MYCHART APP TO SMARTPHONE  - The patient must first make sure to have activated MyChart and know their login information - If Apple, go to [employer] and type in MyChart in the search bar and download the app. If Android, ask patient to go to [employer] and type in [facility] in the search bar and download the app. The  app is free but as with any other app downloads, their phone may require them to verify saved payment information or Apple/Android password.  - The patient will need to then log into the app with their MyChart username and password, and select [facility] as their healthcare provider to link the account. When it is time for your visit, go to the MyChart app, find appointments, and click Begin Video Visit. Be sure to Select Allow for your device to access the Microphone and Camera for your visit. You will then be connected, and your provider will be with you shortly.  **If they have any issues connecting, or need assistance please contact MyChart service desk [phone])**  **If using a computer, in order to ensure the best quality for their visit they will need to use either of the following Internet Browsers: [employer], or Google Chrome**  IF USING DOXIMITY or DOXY.ME - The patient will receive a link just prior to their visit by text.     FULL LENGTH CONSENT FOR TELE-HEALTH VISIT   I hereby voluntarily request, consent and authorize [facility] and its employed or contracted physicians, physician assistants, nurse practitioners or other licensed health care professionals (the Practitioner), to provide me with telemedicine health care services (the "Services") as deemed necessary by the treating Practitioner. I acknowledge and consent to receive the Services by the Practitioner via telemedicine. I understand that the telemedicine visit will involve communicating with the Practitioner through live audiovisual communication technology and the disclosure of certain medical information by electronic transmission. I acknowledge that I have been given the opportunity to request an in-person assessment or other available alternative prior to the telemedicine visit and am voluntarily participating in the telemedicine visit.  I understand that I have the right to withhold or withdraw  my consent to the use of telemedicine in the course of my care at any time, without affecting my right to future care or treatment, and that the Practitioner or I may terminate the telemedicine visit at any time. I understand that I have the right to inspect all information obtained and/or recorded in the course of the telemedicine visit and may receive copies of available information for a reasonable fee.  I understand that some of the potential risks of receiving the Services via telemedicine include:  Marland Kitchen Delay or interruption in medical evaluation due to technological equipment failure or disruption; . Information transmitted may not be sufficient (e.g. poor resolution of images) to allow for appropriate medical decision making by the Practitioner; and/or  . In rare instances, security protocols could fail, causing a breach of personal health information.  Furthermore, I acknowledge that it is my responsibility to provide information about my medical history, conditions and care that is complete and accurate to the best of my ability. I acknowledge that Practitioner's advice, recommendations, and/or decision may be based on factors  not within their control, such as incomplete or inaccurate data provided by me or distortions of diagnostic images or specimens that may result from electronic transmissions. I understand that the practice of medicine is not an exact science and that Practitioner makes no warranties or guarantees regarding treatment outcomes. I acknowledge that I will receive a copy of this consent concurrently upon execution via email to the email address I last provided but may also request a printed copy by calling the office of [facility].    I understand that my insurance will be billed for this visit.   I have read or had this consent read to me. . I understand the contents of this consent, which adequately explains the benefits and risks of the Services being provided via  telemedicine.  . I have been provided ample opportunity to ask questions regarding this consent and the Services and have had my questions answered to my satisfaction. . I give my informed consent for the services to be provided through the use of telemedicine in my medical care  By participating in this telemedicine visit I agree to the above.

## 2019-04-09 NOTE — Telephone Encounter (Signed)
Attempted to contact pt in regards to appt with Dr. Tamala Julian on 5/12.  Left message to call back.  Pt needs to be changed to a virtual visit with Doximity if possible.  Was going to offer same date and time currently scheduled, just change to virtual. Please send to me or Earvin Hansen for scheduling.

## 2019-04-19 DIAGNOSIS — N183 Chronic kidney disease, stage 3 unspecified: Secondary | ICD-10-CM

## 2019-04-19 HISTORY — DX: Chronic kidney disease, stage 3 unspecified: N18.30

## 2019-04-19 NOTE — Progress Notes (Signed)
Virtual Visit via Telephone Note   This visit type was conducted due to national recommendations for restrictions regarding the COVID-19 Pandemic (e.g. social distancing) in an effort to limit this patient's exposure and mitigate transmission in our community.  Due to her co-morbid illnesses, this patient is at least at moderate risk for complications without adequate follow up.  This format is felt to be most appropriate for this patient at this time.  The patient did not have access to video technology/had technical difficulties with video requiring transitioning to audio format only (telephone).  All issues noted in this document were discussed and addressed.  No physical exam could be performed with this format.  Please refer to the patient's chart for her  consent to telehealth for [facility].   Date:  04/19/2019   ID:  Jasmine [name], DOB [birth year], MRN [id number]  Patient Location: Home Provider Location: Office  PCP:  [name], MD  Cardiologist:  Sinclair Grooms, MD  Electrophysiologist:  None   Evaluation Performed:  Follow-Up Visit  Chief Complaint:  AF/CHF/CKD  History of Present Illness:    Jasmine [name] is a 85 y.o. female with hx of chronicatrial fibrillation,CHADSVASCscore greater than 2,diastolic heart failure,CKD 3, TIA, and hypertension.  Jasmine [name] is doing better.  Breathing and swelling have improved.  She is weighing herself daily.  She is watching sodium intake and fluid intake.  She is compliant with her medical regimen.  She is ambulatory and having much less difficulty.  She denies chest pain and syncope.  The patient does not have symptoms concerning for COVID-19 infection (fever, chills, cough, or new shortness of breath).    Past Medical History:  Diagnosis Date  . Allergic rhinitis due to pollen   . Atrial fibrillation with rapid ventricular response ([place])   . Congestive heart failure, unspecified    patient states she is not aware  of this  . Dizziness   . DVT (deep venous thrombosis) (HCC) left leg  . Essential hypertension, malignant   . GERD (gastroesophageal reflux disease)   . Heart murmur   . Insulin resistance    History of insulin resistance.  . Insulin resistance   . Obesity   . Pure hypercholesterolemia   . Vertigo    Past Surgical History:  Procedure Laterality Date  . APPENDECTOMY    . BREAST BIOPSY    . CHOLECYSTECTOMY    . PARTIAL HYSTERECTOMY       No outpatient medications have been marked as taking for the 04/20/19 encounter (Appointment) with Belva Crome, MD.     Allergies:   Codeine; Lovenox [enoxaparin sodium]; Norvasc [amlodipine besylate]; Sulfa antibiotics; and Latex   Social History   Tobacco Use  . Smoking status: Never Smoker  . Smokeless tobacco: Never Used  Substance Use Topics  . Alcohol use: No  . Drug use: No     Family Hx: The patient's family history includes Diabetes in her mother; Hypertension in her sister; Pulmonary embolism in her sister.  ROS:   Please see the history of present illness.    Concerned about her kidney function. All other systems reviewed and are negative.   Prior CV studies:   The following studies were reviewed today:  No recent studies.  Echo performed October 2019 demonstrates EF 50 to 55%.  Labs/Other Tests and Data Reviewed:    EKG:  No ECG reviewed.  Recent Labs: 10/02/2018: ALT 14 10/03/2018: Hemoglobin 10.0; Platelets 154 11/23/2018: NT-Pro  BNP 3,049 02/23/2019: BUN 43; Creatinine, Ser 1.60; Potassium 4.5; Sodium 138   Recent Lipid Panel Lab Results  Component Value Date/Time   CHOL 95 10/03/2018 05:21 AM   TRIG 45 10/03/2018 05:21 AM   HDL 26 (L) 10/03/2018 05:21 AM   CHOLHDL 3.7 10/03/2018 05:21 AM   LDLCALC 60 10/03/2018 05:21 AM    Wt Readings from Last 3 Encounters:  12/18/18 161 lb (73 kg)  11/02/18 170 lb (77.1 kg)  10/03/18 186 lb 8.2 oz (84.6 kg)     Objective:    Vital Signs:  There were no  vitals taken for this visit.   VITAL SIGNS:  reviewed  ASSESSMENT & PLAN:    1. Permanent atrial fibrillation   2. CKD (chronic kidney disease) stage 3, GFR 30-59 ml/min (HCC)   3. Chronic diastolic HF (heart failure) ([place])   4. Long term current use of anticoagulant therapy   5. TIA (transient ischemic attack)   6. Chronic deep vein thrombosis (DVT) of distal vein of left lower extremity (HCC)   7. Essential hypertension   8. 2019 novel coronavirus disease (COVID-19)    PLAN:  1. Has good heart rate control with known chronic/permanent atrial fibrillation 2. Basic metabolic panel within the next 4 weeks 3. We will check a BMP at next lab draw. 4. INR is followed by Dr. Kevan Ny. 5. Not discussed 6. Not discussed but continues on chronic anticoagulation as prophylaxis for both stroke and recurrent DVT. 7. Target blood pressure is within the range desired.  Salt and fluid restrictions were reemphasized. 8.   COVID-19 Education: The signs and symptoms of COVID-19 were discussed with the patient and how to seek care for testing (follow up with PCP or arrange E-visit).  The importance of social distancing was discussed today.  Time:   Today, I have spent 15 minutes with the patient with telehealth technology discussing the above problems.     Medication Adjustments/Labs and Tests Ordered: Current medicines are reviewed at length with the patient today.  Concerns regarding medicines are outlined above.   Tests Ordered: No orders of the defined types were placed in this encounter.   Medication Changes: No orders of the defined types were placed in this encounter.   Disposition:  Follow up in 4 month(s)  Signed, Sinclair Grooms, MD  04/19/2019 2:02 PM    [place]

## 2019-04-20 ENCOUNTER — Other Ambulatory Visit: Payer: Self-pay

## 2019-04-20 ENCOUNTER — Telehealth (INDEPENDENT_AMBULATORY_CARE_PROVIDER_SITE_OTHER): Payer: Medicare Other | Admitting: Interventional Cardiology

## 2019-04-20 ENCOUNTER — Encounter: Payer: Self-pay | Admitting: Interventional Cardiology

## 2019-04-20 VITALS — BP 138/61 | HR 61 | Ht 64.0 in | Wt 169.0 lb

## 2019-04-20 DIAGNOSIS — I5032 Chronic diastolic (congestive) heart failure: Secondary | ICD-10-CM

## 2019-04-20 DIAGNOSIS — I4821 Permanent atrial fibrillation: Secondary | ICD-10-CM | POA: Diagnosis not present

## 2019-04-20 DIAGNOSIS — I10 Essential (primary) hypertension: Secondary | ICD-10-CM

## 2019-04-20 DIAGNOSIS — I825Z2 Chronic embolism and thrombosis of unspecified deep veins of left distal lower extremity: Secondary | ICD-10-CM

## 2019-04-20 DIAGNOSIS — N183 Chronic kidney disease, stage 3 unspecified: Secondary | ICD-10-CM

## 2019-04-20 DIAGNOSIS — U071 COVID-19: Secondary | ICD-10-CM

## 2019-04-20 DIAGNOSIS — Z7901 Long term (current) use of anticoagulants: Secondary | ICD-10-CM

## 2019-04-20 DIAGNOSIS — G459 Transient cerebral ischemic attack, unspecified: Secondary | ICD-10-CM

## 2019-04-20 NOTE — Patient Instructions (Signed)
Medication Instructions:  Your physician recommends that you continue on your current medications as directed. Please refer to the Current Medication list given to you today.  If you need a refill on your cardiac medications before your next appointment, please call your pharmacy.   Lab work: Your physician recommends that you return for lab work within the next month.  If you have labs (blood work) drawn today and your tests are completely normal, you will receive your results only by: Marland Kitchen MyChart Message (if you have MyChart) OR . A paper copy in the mail If you have any lab test that is abnormal or we need to change your treatment, we will call you to review the results.  Testing/Procedures: None  Follow-Up: At [facility], you and your health needs are our priority.  As part of our continuing mission to provide you with exceptional heart care, we have created designated Provider Care Teams.  These Care Teams include your primary Cardiologist (physician) and Advanced Practice Providers (APPs -  Physician Assistants and Nurse Practitioners) who all work together to provide you with the care you need, when you need it. You will need a follow up appointment in 4 months.  Please call our office 2 months in advance to schedule this appointment.  You may see Sinclair Grooms, MD or one of the following Advanced Practice Providers on your designated Care Team:   Truitt Merle, NP Cecilie Kicks, NP . Kathyrn Drown, NP  Any Other Special Instructions Will Be Listed Below (If Applicable).

## 2019-05-10 ENCOUNTER — Telehealth: Payer: Self-pay | Admitting: *Deleted

## 2019-05-10 NOTE — Telephone Encounter (Signed)
    COVID-19 Pre-Screening Questions:  . In the past 7 to 10 days have you had a cough,  shortness of breath, headache, congestion, fever (100 or greater) body aches, chills, sore throat, or sudden loss of taste or sense of smell? . Have you been around anyone with known Covid 19. . Have you been around anyone who is awaiting Covid 19 test results in the past 7 to 10 days? . Have you been around anyone who has been exposed to Covid 19, or has mentioned symptoms of Covid 19 within the past 7 to 10 days?  If you have any concerns/questions about symptoms patients report during screening (either on the phone or at [facility]). Contact the provider seeing the patient or DOD for further guidance.  If neither are available contact a member of the leadership team.           Contacted patient via telephone call. All Covid 19 questions were no. Patient has a mask. KB

## 2019-05-12 ENCOUNTER — Other Ambulatory Visit: Payer: Self-pay

## 2019-05-12 ENCOUNTER — Other Ambulatory Visit: Payer: Medicare Other | Admitting: *Deleted

## 2019-05-12 DIAGNOSIS — I5032 Chronic diastolic (congestive) heart failure: Secondary | ICD-10-CM

## 2019-05-12 LAB — BASIC METABOLIC PANEL
BUN/Creatinine Ratio: 32 — ABNORMAL HIGH (ref 12–28)
BUN: 60 mg/dL — ABNORMAL HIGH (ref 8–27)
CO2: 19 mmol/L — ABNORMAL LOW (ref 20–29)
Calcium: 10.2 mg/dL (ref 8.7–10.3)
Chloride: 99 mmol/L (ref 96–106)
Creatinine, Ser: 1.87 mg/dL — ABNORMAL HIGH (ref 0.57–1.00)
GFR calc Af Amer: 28 mL/min/{1.73_m2} — ABNORMAL LOW (ref >59)
GFR calc non Af Amer: 25 mL/min/{1.73_m2} — ABNORMAL LOW (ref >59)
Glucose: 110 mg/dL — ABNORMAL HIGH (ref 65–99)
Potassium: 4.6 mmol/L (ref 3.5–5.2)
Sodium: 138 mmol/L (ref 134–144)

## 2019-05-12 LAB — PRO B NATRIURETIC PEPTIDE: NT-Pro BNP: 1994 pg/mL — ABNORMAL HIGH (ref 0–738)

## 2019-05-17 ENCOUNTER — Telehealth: Payer: Self-pay | Admitting: *Deleted

## 2019-05-17 DIAGNOSIS — I5032 Chronic diastolic (congestive) heart failure: Secondary | ICD-10-CM

## 2019-05-17 MED ORDER — SPIRONOLACTONE 25 MG PO TABS: 12.5000 mg | ORAL_TABLET | Freq: Every day | ORAL | 3 refills | Status: DC

## 2019-05-17 NOTE — Telephone Encounter (Signed)
Spoke with pt and went over recommendations.  Pt verbalized understanding and was appreciative for call.  She will come for labs on the 6/22.

## 2019-05-17 NOTE — Telephone Encounter (Signed)
-----   Message from Belva Crome, MD sent at 05/14/2019  9:37 PM EDT ----- Let the patient know we should try tp go back to Spironolactone 12.5 mg daily. Check BMET, BNP in 2 weeks. A copy will be sent to [name], MD

## 2019-05-27 ENCOUNTER — Other Ambulatory Visit: Payer: Self-pay | Admitting: Interventional Cardiology

## 2019-05-28 LAB — BASIC METABOLIC PANEL
BUN/Creatinine Ratio: 32 — ABNORMAL HIGH (ref 12–28)
BUN: 57 mg/dL — ABNORMAL HIGH (ref 8–27)
CO2: 18 mmol/L — ABNORMAL LOW (ref 20–29)
Calcium: 9.6 mg/dL (ref 8.7–10.3)
Chloride: 107 mmol/L — ABNORMAL HIGH (ref 96–106)
Creatinine, Ser: 1.77 mg/dL — ABNORMAL HIGH (ref 0.57–1.00)
GFR calc Af Amer: 30 mL/min/{1.73_m2} — ABNORMAL LOW (ref >59)
GFR calc non Af Amer: 26 mL/min/{1.73_m2} — ABNORMAL LOW (ref >59)
Glucose: 110 mg/dL — ABNORMAL HIGH (ref 65–99)
Potassium: 4.5 mmol/L (ref 3.5–5.2)
Sodium: 140 mmol/L (ref 134–144)

## 2019-05-28 LAB — PRO B NATRIURETIC PEPTIDE: NT-Pro BNP: 2619 pg/mL — ABNORMAL HIGH (ref 0–738)

## 2019-05-31 ENCOUNTER — Other Ambulatory Visit: Payer: Medicare Other

## 2019-06-15 ENCOUNTER — Other Ambulatory Visit: Payer: Self-pay | Admitting: Interventional Cardiology

## 2019-06-16 ENCOUNTER — Encounter: Payer: Self-pay | Admitting: Podiatry

## 2019-06-16 ENCOUNTER — Ambulatory Visit: Payer: Medicare Other | Admitting: Podiatry

## 2019-06-16 ENCOUNTER — Other Ambulatory Visit: Payer: Self-pay

## 2019-06-16 VITALS — Temp 97.8°F

## 2019-06-16 DIAGNOSIS — M79675 Pain in left toe(s): Secondary | ICD-10-CM | POA: Diagnosis not present

## 2019-06-16 DIAGNOSIS — M79674 Pain in right toe(s): Secondary | ICD-10-CM | POA: Diagnosis not present

## 2019-06-16 DIAGNOSIS — B351 Tinea unguium: Secondary | ICD-10-CM

## 2019-06-16 NOTE — Patient Instructions (Signed)

## 2019-06-17 NOTE — Progress Notes (Signed)
Subjective: Jasmine [name] is a 85 y.o. y.o. female who presents for preventative foot care today with PAD and cc of painful, discolored, thick toenails and painful callus/corn which interfere with daily activities. Pain is aggravated when wearing enclosed shoe gear. Pain is relieved with periodic professional debridement.  [name], MD is her PCP.    Current Outpatient Medications:  .  amLODipine (NORVASC) 5 MG tablet, Take 1 tablet (5 mg total) by mouth daily., Disp: 90 tablet, Rfl: 3 .  atenolol (TENORMIN) 50 MG tablet, Take 1 tablet (50 mg total) by mouth daily., Disp: 30 tablet, Rfl: 11 .  colchicine 0.6 MG tablet, Take 0.6 mg by mouth daily., Disp: , Rfl: 2 .  furosemide (LASIX) 40 MG tablet, Take 1 tablet by mouth once daily, Disp: 90 tablet, Rfl: 2 .  irbesartan (AVAPRO) 75 MG tablet, Take 1 tablet (75 mg total) by mouth daily., Disp: 30 tablet, Rfl: 11 .  meclizine (ANTIVERT) 25 MG tablet, Take 12.5 mg by mouth 3 (three) times daily as needed for dizziness. , Disp: , Rfl: 1 .  spironolactone (ALDACTONE) 25 MG tablet, Take 0.5 tablets (12.5 mg total) by mouth daily., Disp: 90 tablet, Rfl: 3 .  triamcinolone cream (KENALOG) 0.5 %, , Disp: , Rfl:  .  warfarin (COUMADIN) 1 MG tablet, Take 1 mg by mouth daily. , Disp: , Rfl:   Allergies  Allergen Reactions  . Codeine   . Lovenox [Enoxaparin Sodium]   . Norvasc [Amlodipine Besylate]   . Sulfa Antibiotics   . Latex Rash    Objective: Vitals:   06/16/19 1353  Temp: 97.8 F (36.6 C)    Vascular Examination: Capillary refill time <3 seconds x 10 digits  Dorsalis pedis pulses faintly palpable b/l.  Posterior tibial pulses faintly palpable b/l.  No digital hair x 10 digits.  Dependent rubor b/l LE.  Dermatological Examination: Skin with normal turgor, texture and tone b/l.  Toenails 1-5 b/l discolored, thick, dystrophic with subungual debris and pain with palpation to nailbeds due to thickness of nails.  Resolved  corn 5th digit PIPJ right foot.  Musculoskeletal: Muscle strength 5/5 to all LE muscle groups.  Hammertoes 2-5 b/l.  Neurological: Sensation intact 5/5 b/l with 10 gram monofilament.  Vibratory sensation intact b/l.  Assessment: 1. Painful onychomycosis toenails 1-5 b/l 2. Peripheral arterial disease 3. Resolved corn right 5th digit  Plan: 1. Toenails 1-5 b/l were debrided in length and girth without iatrogenic bleeding. 2. Patient to continue soft, supportive shoe gear daily. 3. Patient to report any pedal injuries to medical professional immediately. 4. Follow up 3 months.  5. Patient/POA to call should there be a concern in the interim.

## 2019-08-17 ENCOUNTER — Telehealth: Payer: Self-pay

## 2019-08-17 NOTE — Telephone Encounter (Signed)

## 2019-08-18 NOTE — Progress Notes (Signed)
Virtual Visit via Telephone Note   This visit type was conducted due to national recommendations for restrictions regarding the COVID-19 Pandemic (e.g. social distancing) in an effort to limit this patient's exposure and mitigate transmission in our community.  Due to her co-morbid illnesses, this patient is at least at moderate risk for complications without adequate follow up.  This format is felt to be most appropriate for this patient at this time.  The patient did not have access to video technology/had technical difficulties with video requiring transitioning to audio format only (telephone).  All issues noted in this document were discussed and addressed.  No physical exam could be performed with this format.  Please refer to the patient's chart for her  consent to telehealth for [facility].   Date:  08/19/2019   ID:  Jasmine [name], DOB [birth year], MRN [id number]  Patient Location: Home Provider Location: Home  PCP:  [name], MD  Cardiologist:  Sinclair Grooms, MD  Electrophysiologist:  None   Evaluation Performed:  Follow-Up Visit  Chief Complaint:  CHF/AF  History of Present Illness:    Jasmine [name] is a 85 y.o. female with chronicatrial fibrillation,CHADSVASCscore greater than 2,diastolic heart failure,CKD 3, TIA, and hypertension.  She is doing well.  She saw Dr. Marlou Sa earlier this week.  Her weight was 152 pounds.  She has trace left lower extremity edema.  She denies chest pain.  She is switched over to Eliquis without complications.  She is still reluctant about coming into the office.  No blood work is been done since June.  The patient does not have symptoms concerning for COVID-19 infection (fever, chills, cough, or new shortness of breath).    Past Medical History:  Diagnosis Date  . Allergic rhinitis due to pollen   . Atrial fibrillation with rapid ventricular response ([place])   . Congestive heart failure, unspecified    patient states she  is not aware of this  . Dizziness   . DVT (deep venous thrombosis) (HCC) left leg  . Essential hypertension, malignant   . GERD (gastroesophageal reflux disease)   . Heart murmur   . Insulin resistance    History of insulin resistance.  . Insulin resistance   . Obesity   . Pure hypercholesterolemia   . Vertigo    Past Surgical History:  Procedure Laterality Date  . APPENDECTOMY    . BREAST BIOPSY    . CHOLECYSTECTOMY    . PARTIAL HYSTERECTOMY       No outpatient medications have been marked as taking for the 08/19/19 encounter (Telemedicine) with Belva Crome, MD.     Allergies:   Codeine, Lovenox [enoxaparin sodium], Norvasc [amlodipine besylate], Sulfa antibiotics, and Latex   Social History   Tobacco Use  . Smoking status: Never Smoker  . Smokeless tobacco: Never Used  Substance Use Topics  . Alcohol use: No  . Drug use: No     Family Hx: The patient's family history includes Diabetes in her mother; Hypertension in her sister; Pulmonary embolism in her sister.  ROS:   Please see the history of present illness.    Overall she feels well.  She has improved overall feelings concerning the pandemic.  She is getting out some with physical activity. All other systems reviewed and are negative.   Prior CV studies:   The following studies were reviewed today:  No new data.  Labs/Other Tests and Data Reviewed:    EKG:  No  ECG reviewed.  Recent Labs: 10/02/2018: ALT 14 10/03/2018: Hemoglobin 10.0; Platelets 154 05/27/2019: BUN 57; Creatinine, Ser 1.77; NT-Pro BNP 2,619; Potassium 4.5; Sodium 140   Recent Lipid Panel Lab Results  Component Value Date/Time   CHOL 95 10/03/2018 05:21 AM   TRIG 45 10/03/2018 05:21 AM   HDL 26 (L) 10/03/2018 05:21 AM   CHOLHDL 3.7 10/03/2018 05:21 AM   LDLCALC 60 10/03/2018 05:21 AM    Wt Readings from Last 3 Encounters:  08/19/19 152 lb (68.9 kg)  04/20/19 169 lb (76.7 kg)  12/18/18 161 lb (73 kg)     Objective:     Vital Signs:  BP (!) 141/72   Pulse 61   Ht 5\' 4"  (1.626 m)   Wt 152 lb (68.9 kg)   BMI 26.09 kg/m    VITAL SIGNS:  reviewed  ASSESSMENT & PLAN:     1. Chronic diastolic HF (heart failure) ([place])   2. Permanent atrial fibrillation   3. CKD (chronic kidney disease) stage 3, GFR 30-59 ml/min (HCC)   4. Long term current use of anticoagulant therapy   5. TIA (transient ischemic attack)   6. Essential hypertension   7. Educated About Covid-19 Virus Infection    PLAN:  1. Same therapy 2. Unchanged 3. BMET needed. BNP as well 4. Now on Eliquis 5. Not addressed. 6. Target 130/80 mm Hg 7. Social distancing, masking, and handwashing recommended.  Needs 3 month IP f/u with me. BMET now and then.  COVID-19 Education: The signs and symptoms of COVID-19 were discussed with the patient and how to seek care for testing (follow up with PCP or arrange E-visit).  The importance of social distancing was discussed today.  Time:   Today, I have spent 15 minutes with the patient with telehealth technology discussing the above problems.     Medication Adjustments/Labs and Tests Ordered: Current medicines are reviewed at length with the patient today.  Concerns regarding medicines are outlined above.   Tests Ordered: No orders of the defined types were placed in this encounter.   Medication Changes: No orders of the defined types were placed in this encounter.   Follow Up:  In Person in 3 month(s)  Signed, Sinclair Grooms, MD  08/19/2019 9:36 AM    [place]

## 2019-08-19 ENCOUNTER — Telehealth (INDEPENDENT_AMBULATORY_CARE_PROVIDER_SITE_OTHER): Payer: Medicare Other | Admitting: Interventional Cardiology

## 2019-08-19 ENCOUNTER — Other Ambulatory Visit: Payer: Self-pay

## 2019-08-19 ENCOUNTER — Encounter (INDEPENDENT_AMBULATORY_CARE_PROVIDER_SITE_OTHER): Payer: Self-pay

## 2019-08-19 ENCOUNTER — Encounter: Payer: Self-pay | Admitting: Interventional Cardiology

## 2019-08-19 ENCOUNTER — Other Ambulatory Visit: Payer: Medicare Other | Admitting: *Deleted

## 2019-08-19 VITALS — BP 141/72 | HR 61 | Ht 64.0 in | Wt 152.0 lb

## 2019-08-19 DIAGNOSIS — I4821 Permanent atrial fibrillation: Secondary | ICD-10-CM

## 2019-08-19 DIAGNOSIS — I5032 Chronic diastolic (congestive) heart failure: Secondary | ICD-10-CM

## 2019-08-19 DIAGNOSIS — Z7189 Other specified counseling: Secondary | ICD-10-CM

## 2019-08-19 DIAGNOSIS — I10 Essential (primary) hypertension: Secondary | ICD-10-CM

## 2019-08-19 DIAGNOSIS — N183 Chronic kidney disease, stage 3 unspecified: Secondary | ICD-10-CM

## 2019-08-19 DIAGNOSIS — G459 Transient cerebral ischemic attack, unspecified: Secondary | ICD-10-CM

## 2019-08-19 DIAGNOSIS — Z7901 Long term (current) use of anticoagulants: Secondary | ICD-10-CM

## 2019-08-19 NOTE — Addendum Note (Signed)
Addended by: Loren Racer on: 08/19/2019 09:52 AM   Modules accepted: Orders

## 2019-08-19 NOTE — Patient Instructions (Signed)
Medication Instructions:  Your physician recommends that you continue on your current medications as directed. Please refer to the Current Medication list given to you today.  If you need a refill on your cardiac medications before your next appointment, please call your pharmacy.   Lab work: BMET and Pro BNP soon  If you have labs (blood work) drawn today and your tests are completely normal, you will receive your results only by: Marland Kitchen MyChart Message (if you have MyChart) OR . A paper copy in the mail If you have any lab test that is abnormal or we need to change your treatment, we will call you to review the results.  Testing/Procedures: None  Follow-Up: At [facility], you and your health needs are our priority.  As part of our continuing mission to provide you with exceptional heart care, we have created designated Provider Care Teams.  These Care Teams include your primary Cardiologist (physician) and Advanced Practice Providers (APPs -  Physician Assistants and Nurse Practitioners) who all work together to provide you with the care you need, when you need it. You will need a follow up appointment in 3 months.  Please call our office 2 months in advance to schedule this appointment.  You may see Sinclair Grooms, MD or one of the following Advanced Practice Providers on your designated Care Team:   Truitt Merle, NP Cecilie Kicks, NP . Kathyrn Drown, NP  Any Other Special Instructions Will Be Listed Below (If Applicable).

## 2019-08-20 LAB — BASIC METABOLIC PANEL
BUN/Creatinine Ratio: 29 — ABNORMAL HIGH (ref 12–28)
BUN: 42 mg/dL — ABNORMAL HIGH (ref 8–27)
CO2: 18 mmol/L — ABNORMAL LOW (ref 20–29)
Calcium: 9.5 mg/dL (ref 8.7–10.3)
Chloride: 102 mmol/L (ref 96–106)
Creatinine, Ser: 1.43 mg/dL — ABNORMAL HIGH (ref 0.57–1.00)
GFR calc Af Amer: 39 mL/min/{1.73_m2} — ABNORMAL LOW (ref >59)
GFR calc non Af Amer: 34 mL/min/{1.73_m2} — ABNORMAL LOW (ref >59)
Glucose: 90 mg/dL (ref 65–99)
Potassium: 4.5 mmol/L (ref 3.5–5.2)
Sodium: 137 mmol/L (ref 134–144)

## 2019-08-20 LAB — PRO B NATRIURETIC PEPTIDE: NT-Pro BNP: 2961 pg/mL — ABNORMAL HIGH (ref 0–738)

## 2019-09-15 ENCOUNTER — Ambulatory Visit: Payer: Medicare Other | Admitting: Podiatry

## 2019-09-15 ENCOUNTER — Encounter: Payer: Self-pay | Admitting: Podiatry

## 2019-09-15 ENCOUNTER — Other Ambulatory Visit: Payer: Self-pay

## 2019-09-15 DIAGNOSIS — B351 Tinea unguium: Secondary | ICD-10-CM | POA: Diagnosis not present

## 2019-09-15 DIAGNOSIS — M79675 Pain in left toe(s): Secondary | ICD-10-CM

## 2019-09-15 DIAGNOSIS — M79674 Pain in right toe(s): Secondary | ICD-10-CM

## 2019-09-15 NOTE — Patient Instructions (Signed)
Corns and Calluses Corns are small areas of thickened skin that occur on the top, sides, or tip of a toe. They contain a cone-shaped core with a point that can press on a nerve below. This causes pain.  Calluses are areas of thickened skin that can occur anywhere on the body, including the hands, fingers, palms, soles of the feet, and heels. Calluses are usually larger than corns. What are the causes? Corns and calluses are caused by rubbing (friction) or pressure, such as from shoes that are too tight or do not fit properly. What increases the risk? Corns are more likely to develop in people who have misshapen toes (toe deformities), such as hammer toes. Calluses can occur with friction to any area of the skin. They are more likely to develop in people who:  Work with their hands.  Wear shoes that fit poorly, are too tight, or are high-heeled.  Have toe deformities. What are the signs or symptoms? Symptoms of a corn or callus include:  A hard growth on the skin.  Pain or tenderness under the skin.  Redness and swelling.  Increased discomfort while wearing tight-fitting shoes, if your feet are affected. If a corn or callus becomes infected, symptoms may include:  Redness and swelling that gets worse.  Pain.  Fluid, blood, or pus draining from the corn or callus. How is this diagnosed? Corns and calluses may be diagnosed based on your symptoms, your medical history, and a physical exam. How is this treated? Treatment for corns and calluses may include:  Removing the cause of the friction or pressure. This may involve: ? Changing your shoes. ? Wearing shoe inserts (orthotics) or other protective layers in your shoes, such as a corn pad. ? Wearing gloves.  Applying medicine to the skin (topical medicine) to help soften skin in the hardened, thickened areas.  Removing layers of dead skin with a file to reduce the size of the corn or callus.  Removing the corn or callus with a  scalpel or laser.  Taking antibiotic medicines, if your corn or callus is infected.  Having surgery, if a toe deformity is the cause. Follow these instructions at home:   Take over-the-counter and prescription medicines only as told by your health care provider.  If you were prescribed an antibiotic, take it as told by your health care provider. Do not stop taking it even if your condition starts to improve.  Wear shoes that fit well. Avoid wearing high-heeled shoes and shoes that are too tight or too loose.  Wear any padding, protective layers, gloves, or orthotics as told by your health care provider.  Soak your hands or feet and then use a file or pumice stone to soften your corn or callus. Do this as told by your health care provider.  Check your corn or callus every day for symptoms of infection. Contact a health care provider if you:  Notice that your symptoms do not improve with treatment.  Have redness or swelling that gets worse.  Notice that your corn or callus becomes painful.  Have fluid, blood, or pus coming from your corn or callus.  Have new symptoms. Summary  Corns are small areas of thickened skin that occur on the top, sides, or tip of a toe.  Calluses are areas of thickened skin that can occur anywhere on the body, including the hands, fingers, palms, and soles of the feet. Calluses are usually larger than corns.  Corns and calluses are caused by   rubbing (friction) or pressure, such as from shoes that are too tight or do not fit properly.  Treatment may include wearing any padding, protective layers, gloves, or orthotics as told by your health care provider. This information is not intended to replace advice given to you by your health care provider. Make sure you discuss any questions you have with your health care provider. Document Released: 08/31/2004 Document Revised: 03/17/2019 Document Reviewed: 10/08/2017 Elsevier Patient Education  2020 Elsevier  Inc.   Onychomycosis/Fungal Toenails  WHAT IS IT? An infection that lies within the keratin of your nail plate that is caused by a fungus.  WHY ME? Fungal infections affect all ages, sexes, races, and creeds.  There may be many factors that predispose you to a fungal infection such as age, coexisting medical conditions such as diabetes, or an autoimmune disease; stress, medications, fatigue, genetics, etc.  Bottom line: fungus thrives in a warm, moist environment and your shoes offer such a location.  IS IT CONTAGIOUS? Theoretically, yes.  You do not want to share shoes, nail clippers or files with someone who has fungal toenails.  Walking around barefoot in the same room or sleeping in the same bed is unlikely to transfer the organism.  It is important to realize, however, that fungus can spread easily from one nail to the next on the same foot.  HOW DO WE TREAT THIS?  There are several ways to treat this condition.  Treatment may depend on many factors such as age, medications, pregnancy, liver and kidney conditions, etc.  It is best to ask your doctor which options are available to you.  1. No treatment.   Unlike many other medical concerns, you can live with this condition.  However for many people this can be a painful condition and may lead to ingrown toenails or a bacterial infection.  It is recommended that you keep the nails cut short to help reduce the amount of fungal nail. 2. Topical treatment.  These range from herbal remedies to prescription strength nail lacquers.  About 40-50% effective, topicals require twice daily application for approximately 9 to 12 months or until an entirely new nail has grown out.  The most effective topicals are medical grade medications available through physicians offices. 3. Oral antifungal medications.  With an 80-90% cure rate, the most common oral medication requires 3 to 4 months of therapy and stays in your system for a year as the new nail grows out.   Oral antifungal medications do require blood work to make sure it is a safe drug for you.  A liver function panel will be performed prior to starting the medication and after the first month of treatment.  It is important to have the blood work performed to avoid any harmful side effects.  In general, this medication safe but blood work is required. 4. Laser Therapy.  This treatment is performed by applying a specialized laser to the affected nail plate.  This therapy is noninvasive, fast, and non-painful.  It is not covered by insurance and is therefore, out of pocket.  The results have been very good with a 80-95% cure rate.  The [facility] is the only practice in the area to offer this therapy. 5. Permanent Nail Avulsion.  Removing the entire nail so that a new nail will not grow back. 

## 2019-09-19 NOTE — Progress Notes (Signed)
Subjective: Jasmine [name] is a 85 y.o. y.o. female who is on long term blood thinner Eliquis and presents today with painful hammertoe right 2nd digit and  painful, discolored, thick toenails which interfere with daily activities. Pain is aggravated when wearing enclosed shoe gear. Pain is relieved with periodic professional debridement.  Patient states her twin sister was hospitalized recently and was very ill. Her sister is now home recuperating from surgery.   [name], MD is her PCP.   Current Outpatient Medications on File Prior to Visit  Medication Sig Dispense Refill  . amLODipine (NORVASC) 5 MG tablet Take 1 tablet (5 mg total) by mouth daily. 90 tablet 3  . atenolol (TENORMIN) 50 MG tablet Take 1 tablet (50 mg total) by mouth daily. 30 tablet 11  . colchicine 0.6 MG tablet Take 0.6 mg by mouth daily.  2  . ELIQUIS 2.5 MG TABS tablet Take 1 tablet by mouth 2 (two) times daily.    . furosemide (LASIX) 40 MG tablet Take 1 tablet by mouth once daily 90 tablet 2  . irbesartan (AVAPRO) 75 MG tablet Take 1 tablet (75 mg total) by mouth daily. 30 tablet 11  . meclizine (ANTIVERT) 25 MG tablet Take 12.5 mg by mouth 3 (three) times daily as needed for dizziness.   1  . spironolactone (ALDACTONE) 25 MG tablet Take 0.5 tablets (12.5 mg total) by mouth daily. 90 tablet 3  . triamcinolone cream (KENALOG) 0.5 %      No current facility-administered medications on file prior to visit.      Allergies  Allergen Reactions  . Codeine   . Lovenox [Enoxaparin Sodium]   . Norvasc [Amlodipine Besylate]   . Sulfa Antibiotics   . Latex Rash     Objective: Vascular Examination: Capillary refill time <3 seconds x 10 digits.  Dorsalis pedis and Posterior tibial pulses palpable b/l.  No digital hair noted b/l.  Skin temperature gradient WNL b/l.  Dependent rubor noted b/l LE.  Dermatological Examination: Skin with normal turgor, texture and tone b/l.   Toenails 1-5 b/l discolored,  thick, dystrophic with subungual debris and pain with palpation to nailbeds due to thickness of nails.  No corn formation noted digits 1-5 b/l.  Musculoskeletal: Muscle strength 5/5 to all LE muscle groups b/l.  Hammertoe right 2nd digit with no hyperkeratosis. +tenderness to palpation.  Neurological: Sensation intact 5/5 b/l with 10 gram monofilament.  Vibratory sensation intact b/l.  Assessment: Painful onychomycosis toenails 1-5 b/l in patient on blood thinner.  Painful hammertoe right 2nd digit PAD  Plan: 1. Toenails 1-5 b/l were debrided in length and girth without iatrogenic bleeding. 2. For hammertoe right 2nd digit, dispensed Silipos toe cap for daily protection. Recommended shoe gear with stretchable uppers to prevent friction of hammertoe. 3. Patient to continue soft, supportive shoe gear daily. 4. Patient to report any pedal injuries to medical professional immediately. 5. Avoid self trimming due to use of blood thinner. 6. Follow up 3 months. 7. Patient/POA to call should there be a concern in the interim.

## 2019-11-15 NOTE — Progress Notes (Signed)
Cardiology Office Note:    Date:  11/17/2019   ID:  Jasmine [name], DOB 1934/09/28, MRN [id number]  PCP:  [name], MD  Cardiologist:  Sinclair Grooms, MD   Referring MD: [name], MD   Chief Complaint  Patient presents with  . Congestive Heart Failure  . Atrial Fibrillation    History of Present Illness:    Jasmine [name] is a 85 y.o. female with a hx of chronicatrial fibrillation,CHADSVASCscore greater than 2,diastolic heart failure,CKD 3, TIA,and hypertension.  There has been some confusion concerning her Lasix dose.  We instructed 40 mg alternating with 60 mg on a daily basis.  She has been taking 60 mg 3 days/week.  In that context she has gained some weight and is having more dyspnea on exertion and orthopnea.  There has been some lower extremity swelling.  She denies angina.  No neurological complaints.  No bleeding.   Past Medical History:  Diagnosis Date  . Allergic rhinitis due to pollen   . Atrial fibrillation with rapid ventricular response ([place])   . Congestive heart failure, unspecified    patient states she is not aware of this  . Dizziness   . DVT (deep venous thrombosis) (HCC) left leg  . Essential hypertension, malignant   . GERD (gastroesophageal reflux disease)   . Heart murmur   . Insulin resistance    History of insulin resistance.  . Insulin resistance   . Obesity   . Pure hypercholesterolemia   . Vertigo     Past Surgical History:  Procedure Laterality Date  . APPENDECTOMY    . BREAST BIOPSY    . CHOLECYSTECTOMY    . PARTIAL HYSTERECTOMY      Current Medications: Current Meds  Medication Sig  . amLODipine (NORVASC) 5 MG tablet Take 1 tablet (5 mg total) by mouth daily.  Marland Kitchen atenolol (TENORMIN) 50 MG tablet Take 1 tablet (50 mg total) by mouth daily.  . colchicine 0.6 MG tablet Take 0.6 mg by mouth daily.  Marland Kitchen ELIQUIS 2.5 MG TABS tablet Take 1 tablet by mouth 2 (two) times daily.  . furosemide (LASIX) 40 MG tablet Take  60 mg by mouth every other day.  . irbesartan (AVAPRO) 75 MG tablet Take 1 tablet (75 mg total) by mouth daily.  . meclizine (ANTIVERT) 25 MG tablet Take 12.5 mg by mouth 3 (three) times daily as needed for dizziness.   Marland Kitchen spironolactone (ALDACTONE) 25 MG tablet Take 0.5 tablets (12.5 mg total) by mouth daily.  Marland Kitchen triamcinolone cream (KENALOG) 0.5 %      Allergies:   Codeine, Lovenox [enoxaparin sodium], Norvasc [amlodipine besylate], Sulfa antibiotics, and Latex   Social History   Socioeconomic History  . Marital status: Single    Spouse name: Not on file  . Number of children: Not on file  . Years of education: Not on file  . Highest education level: Not on file  Occupational History  . Not on file  Social Needs  . Financial resource strain: Not on file  . Food insecurity    Worry: Not on file    Inability: Not on file  . Transportation needs    Medical: Not on file    Non-medical: Not on file  Tobacco Use  . Smoking status: Never Smoker  . Smokeless tobacco: Never Used  Substance and Sexual Activity  . Alcohol use: No  . Drug use: No  . Sexual activity: Not Currently  Birth control/protection: Post-menopausal  Lifestyle  . Physical activity    Days per week: Not on file    Minutes per session: Not on file  . Stress: Not on file  Relationships  . Social Herbalist on phone: Not on file    Gets together: Not on file    Attends religious service: Not on file    Active member of club or organization: Not on file    Attends meetings of clubs or organizations: Not on file    Relationship status: Not on file  Other Topics Concern  . Not on file  Social History Narrative  . Not on file     Family History: The patient's family history includes Diabetes in her mother; Hypertension in her sister; Pulmonary embolism in her sister.  ROS:   Please see the history of present illness.    Overall if you find [place] 11 over think she just misunderstood the  concept she is depressed and anxious.  Seems somewhat disturbed.  Does not voice real complaints.  All other systems reviewed and are negative.  EKGs/Labs/Other Studies Reviewed:    The following studies were reviewed today: No new imaging  EKG:  EKG atrial fibrillation with controlled rate of 69 bpm.  Poor R wave progression.  And when compared to be EKG from 10/06/2018, no changes noted.  Recent Labs: 08/19/2019: BUN 42; Creatinine, Ser 1.43; NT-Pro BNP 2,961; Potassium 4.5; Sodium 137  Recent Lipid Panel    Component Value Date/Time   CHOL 95 10/03/2018 0521   TRIG 45 10/03/2018 0521   HDL 26 (L) 10/03/2018 0521   CHOLHDL 3.7 10/03/2018 0521   VLDL 9 10/03/2018 0521   LDLCALC 60 10/03/2018 0521    Physical Exam:    VS:  BP (!) 144/86   Pulse 69   Ht 5' 4"  (1.626 m)   Wt 158 lb 12.8 oz (72 kg)   SpO2 100%   BMI 27.26 kg/m     Wt Readings from Last 3 Encounters:  11/17/19 158 lb 12.8 oz (72 kg)  08/19/19 152 lb (68.9 kg)  04/20/19 169 lb (76.7 kg)     GEN: Mast and compatible with age. No acute distress HEENT: Normal NECK: No JVD. LYMPHATICS: No lymphadenopathy CARDIAC: Irregularly irregular RR without murmur, gallop, or edema. VASCULAR:  Normal Pulses. No bruits. RESPIRATORY:  Clear to auscultation without rales, wheezing or rhonchi  ABDOMEN: Soft, non-tender, non-distended, No pulsatile mass, MUSCULOSKELETAL: No deformity  SKIN: Warm and dry NEUROLOGIC:  Alert and oriented x 3 PSYCHIATRIC:  Normal affect   ASSESSMENT:    1. Chronic diastolic HF (heart failure) ([place])   2. Permanent atrial fibrillation (HCC)   3. Stage 3b chronic kidney disease   4. Essential hypertension   5. Long term current use of anticoagulant therapy   6. TIA (transient ischemic attack)   7. Educated about COVID-19 virus infection    PLAN:    In order of problems listed above:  1. Volume overload.  Misunderstanding concerning dose of Lasix.  She is taking 60 mg 3 days a week.   The recommendation was to be on 40 mg alternating with 60 mg every day.  We will institute this and get a be met in 1 week. 2. Good rate control 3. Will reevaluate kidney function on the new dose of furosemide and 1 week 4. No bleeding complications 5. None 6. 3W's implemented to avoid COVID-19 infection  Clinical follow-up with me in  3 months.  Lab work in 1 week.   Medication Adjustments/Labs and Tests Ordered: Current medicines are reviewed at length with the patient today.  Concerns regarding medicines are outlined above.  No orders of the defined types were placed in this encounter.  No orders of the defined types were placed in this encounter.   There are no Patient Instructions on file for this visit.   Signed, Sinclair Grooms, MD  11/17/2019 9:39 AM    [facility]

## 2019-11-17 ENCOUNTER — Encounter: Payer: Self-pay | Admitting: Interventional Cardiology

## 2019-11-17 ENCOUNTER — Encounter (INDEPENDENT_AMBULATORY_CARE_PROVIDER_SITE_OTHER): Payer: Self-pay

## 2019-11-17 ENCOUNTER — Ambulatory Visit: Payer: Medicare Other | Admitting: Interventional Cardiology

## 2019-11-17 ENCOUNTER — Other Ambulatory Visit: Payer: Self-pay

## 2019-11-17 VITALS — BP 144/86 | HR 69 | Ht 64.0 in | Wt 158.8 lb

## 2019-11-17 DIAGNOSIS — I10 Essential (primary) hypertension: Secondary | ICD-10-CM | POA: Diagnosis not present

## 2019-11-17 DIAGNOSIS — I5032 Chronic diastolic (congestive) heart failure: Secondary | ICD-10-CM | POA: Diagnosis not present

## 2019-11-17 DIAGNOSIS — G459 Transient cerebral ischemic attack, unspecified: Secondary | ICD-10-CM

## 2019-11-17 DIAGNOSIS — I4821 Permanent atrial fibrillation: Secondary | ICD-10-CM

## 2019-11-17 DIAGNOSIS — N1832 Chronic kidney disease, stage 3b: Secondary | ICD-10-CM

## 2019-11-17 DIAGNOSIS — Z7901 Long term (current) use of anticoagulants: Secondary | ICD-10-CM

## 2019-11-17 DIAGNOSIS — Z7189 Other specified counseling: Secondary | ICD-10-CM

## 2019-11-17 MED ORDER — FUROSEMIDE 40 MG PO TABS: ORAL_TABLET | ORAL | 3 refills | Status: DC

## 2019-11-17 NOTE — Patient Instructions (Signed)
Medication Instructions:  1) TAKE Furosmide 40mg  every other day, alternating with 60mg  every other day.  *If you need a refill on your cardiac medications before your next appointment, please call your pharmacy*  Lab Work: BMET in 7-10 days  If you have labs (blood work) drawn today and your tests are completely normal, you will receive your results only by: Marland Kitchen MyChart Message (if you have MyChart) OR . A paper copy in the mail If you have any lab test that is abnormal or we need to change your treatment, we will call you to review the results.  Testing/Procedures: None  Follow-Up: At [facility], you and your health needs are our priority.  As part of our continuing mission to provide you with exceptional heart care, we have created designated Provider Care Teams.  These Care Teams include your primary Cardiologist (physician) and Advanced Practice Providers (APPs -  Physician Assistants and Nurse Practitioners) who all work together to provide you with the care you need, when you need it.  Your next appointment:   2-3 month(s)  The format for your next appointment:   In Person  Provider:   You may see Sinclair Grooms, MD or one of the following Advanced Practice Providers on your designated Care Team:    Truitt Merle, NP  Cecilie Kicks, NP  Kathyrn Drown, NP   Other Instructions

## 2019-11-26 ENCOUNTER — Other Ambulatory Visit: Payer: Self-pay

## 2019-11-26 ENCOUNTER — Other Ambulatory Visit: Payer: Medicare Other | Admitting: *Deleted

## 2019-11-26 DIAGNOSIS — I5032 Chronic diastolic (congestive) heart failure: Secondary | ICD-10-CM

## 2019-11-26 LAB — BASIC METABOLIC PANEL
BUN/Creatinine Ratio: 21 (ref 12–28)
BUN: 38 mg/dL — ABNORMAL HIGH (ref 8–27)
CO2: 20 mmol/L (ref 20–29)
Calcium: 9.7 mg/dL (ref 8.7–10.3)
Chloride: 103 mmol/L (ref 96–106)
Creatinine, Ser: 1.83 mg/dL — ABNORMAL HIGH (ref 0.57–1.00)
GFR calc Af Amer: 29 mL/min/{1.73_m2} — ABNORMAL LOW (ref >59)
GFR calc non Af Amer: 25 mL/min/{1.73_m2} — ABNORMAL LOW (ref >59)
Glucose: 111 mg/dL — ABNORMAL HIGH (ref 65–99)
Potassium: 4.5 mmol/L (ref 3.5–5.2)
Sodium: 140 mmol/L (ref 134–144)

## 2019-12-06 ENCOUNTER — Other Ambulatory Visit: Payer: Self-pay | Admitting: Interventional Cardiology

## 2019-12-06 MED ORDER — AMLODIPINE BESYLATE 5 MG PO TABS: 5.0000 mg | ORAL_TABLET | Freq: Every day | ORAL | 0 refills | Status: DC

## 2019-12-22 ENCOUNTER — Other Ambulatory Visit: Payer: Self-pay

## 2019-12-22 ENCOUNTER — Encounter: Payer: Self-pay | Admitting: Podiatry

## 2019-12-22 ENCOUNTER — Ambulatory Visit: Payer: Medicare Other | Admitting: Podiatry

## 2019-12-22 DIAGNOSIS — M79674 Pain in right toe(s): Secondary | ICD-10-CM | POA: Diagnosis not present

## 2019-12-22 DIAGNOSIS — B351 Tinea unguium: Secondary | ICD-10-CM

## 2019-12-22 DIAGNOSIS — M79675 Pain in left toe(s): Secondary | ICD-10-CM

## 2019-12-22 NOTE — Patient Instructions (Signed)

## 2019-12-28 NOTE — Progress Notes (Signed)
Subjective: Jasmine [name] is seen today for follow up painful, elongated, thickened toenails bilateral feet that she cannot cut. Pain interferes with daily activities. Aggravating factor includes wearing enclosed shoe gear and relieved with periodic debridement.  She is also on blood thinner, Eliquis.  Miss Kierce voices no new pedal concerns on today's visit.  Medications reviewed in chart.  Allergies  Allergen Reactions  . Codeine   . Lovenox [Enoxaparin Sodium]   . Norvasc [Amlodipine Besylate]   . Sulfa Antibiotics   . Latex Rash    Objective:  Vascular Examination: Capillary refill time immediate b/l.  Dorsalis pedis present b/l.  Posterior tibial pulses present b/l.  Digital hair absent b/l.  Dependent rubor b/l LE.  Skin temperature gradient WNL b/l.   Dermatological Examination: Skin with normal turgor, texture and tone b/l.  Toenails 1-5 b/l discolored, thick, dystrophic with subungual debris and pain with palpation to nailbeds due to thickness of nails.  Musculoskeletal: Muscle strength 5/5 to all LE muscle groups b/l.  Hammertoe right 2nd digit.   No pain, crepitus or joint limitation noted with ROM.   Neurological Examination: Protective sensation intact with 10 gram monofilament bilaterally.  Epicritic sensation present bilaterally.  Vibratory sensation intact bilaterally.   Assessment: Painful onychomycosis toenails 1-5 b/l   Plan: 1. Toenails 1-5 b/l were debrided in length and girth without iatrogenic bleeding. 2. For hammertoe right 2nd digit, continue Silipos toe cap for daily protection. Apply every morning, remove every evening. 3. Patient to continue soft, supportive shoe gear daily. 4. Patient to report any pedal injuries to medical professional immediately. 5. Follow up 3 months.  6. Patient/POA to call should there be a concern in the interim.

## 2020-01-11 DIAGNOSIS — J342 Deviated nasal septum: Secondary | ICD-10-CM

## 2020-01-11 DIAGNOSIS — R040 Epistaxis: Secondary | ICD-10-CM

## 2020-01-11 HISTORY — DX: Epistaxis: R04.0

## 2020-01-11 HISTORY — DX: Deviated nasal septum: J34.2

## 2020-01-19 ENCOUNTER — Other Ambulatory Visit: Payer: Self-pay | Admitting: Interventional Cardiology

## 2020-01-19 MED ORDER — IRBESARTAN 75 MG PO TABS: 75.0000 mg | ORAL_TABLET | Freq: Every day | ORAL | 9 refills | Status: DC

## 2020-02-14 ENCOUNTER — Other Ambulatory Visit: Payer: Self-pay | Admitting: Interventional Cardiology

## 2020-02-15 NOTE — Progress Notes (Signed)
Cardiology Office Note:    Date:  02/16/2020   ID:  CADEE AGRO, DOB February 03, 1934, MRN [id number]  PCP:  [name], MD  Cardiologist:  Sinclair Grooms, MD   Referring MD: [name], MD   Chief Complaint  Patient presents with  . Congestive Heart Failure  . Atrial Fibrillation    History of Present Illness:    Jasmine [name] is a 86 y.o. female with a hx of chronicatrial fibrillation,CHADSVASCscore greater than 2,diastolic heart failure,CKD 3, TIA,and hypertension.  Jasmine [name] walked into the office today slowly.  She seemed to not feel well.  She complains of bilateral lower extremity swelling, and dyspnea on exertion.  She has 2 pillow orthopnea.  She feels she has gained weight.  She has not had chest pain.  Had recent blood work done towards the end of February but does not know the results.  She is compliant with her medication regimen though she cannot recite the medications that she takes nor the doses.  She does remember that she takes furosemide 1-1/2 tablets 1 day alternated with 1 tablet the next.  She has no dietary restrictions concerning salt and fluid intake.  Past Medical History:  Diagnosis Date  . Allergic rhinitis due to pollen   . Atrial fibrillation with rapid ventricular response ([place])   . Congestive heart failure, unspecified    patient states she is not aware of this  . Dizziness   . DVT (deep venous thrombosis) (HCC) left leg  . Essential hypertension, malignant   . GERD (gastroesophageal reflux disease)   . Heart murmur   . Insulin resistance    History of insulin resistance.  . Insulin resistance   . Obesity   . Pure hypercholesterolemia   . Vertigo     Past Surgical History:  Procedure Laterality Date  . APPENDECTOMY    . BREAST BIOPSY    . CHOLECYSTECTOMY    . PARTIAL HYSTERECTOMY      Current Medications: Current Meds  Medication Sig  . amLODipine (NORVASC) 5 MG tablet Take 1 tablet (5 mg total) by mouth daily.  Marland Kitchen  atenolol (TENORMIN) 50 MG tablet Take 1 tablet by mouth once daily  . colchicine 0.6 MG tablet Take 0.6 mg by mouth daily.  Marland Kitchen ELIQUIS 2.5 MG TABS tablet Take 1 tablet by mouth 2 (two) times daily.  . furosemide (LASIX) 40 MG tablet Take one tablet (40mg  total) by mouth every other day, alternating with 1.5 tablets (60mg  total) by mouth every other day.  . irbesartan (AVAPRO) 75 MG tablet Take 1 tablet (75 mg total) by mouth daily.  . meclizine (ANTIVERT) 25 MG tablet Take 12.5 mg by mouth 3 (three) times daily as needed for dizziness.   . sodium chloride (OCEAN) 0.65 % SOLN nasal spray Place 1 spray into both nostrils as needed for congestion.  Marland Kitchen spironolactone (ALDACTONE) 25 MG tablet Take 0.5 tablets (12.5 mg total) by mouth daily.  Marland Kitchen triamcinolone ointment (KENALOG) 0.1 % APPLY TOPICALLY TO RASH TWICE DAILY     Allergies:   Codeine, Lovenox [enoxaparin sodium], Norvasc [amlodipine besylate], Sulfa antibiotics, and Latex   Social History   Socioeconomic History  . Marital status: Single    Spouse name: Not on file  . Number of children: Not on file  . Years of education: Not on file  . Highest education level: Not on file  Occupational History  . Not on file  Tobacco Use  . Smoking status:  Never Smoker  . Smokeless tobacco: Never Used  Substance and Sexual Activity  . Alcohol use: No  . Drug use: No  . Sexual activity: Not Currently    Birth control/protection: Post-menopausal  Other Topics Concern  . Not on file  Social History Narrative  . Not on file   Social Determinants of Health   Financial Resource Strain:   . Difficulty of Paying Living Expenses: Not on file  Food Insecurity:   . Worried About Charity fundraiser in the Last Year: Not on file  . Ran Out of Food in the Last Year: Not on file  Transportation Needs:   . Lack of Transportation (Medical): Not on file  . Lack of Transportation (Non-Medical): Not on file  Physical Activity:   . Days of Exercise per  Week: Not on file  . Minutes of Exercise per Session: Not on file  Stress:   . Feeling of Stress : Not on file  Social Connections:   . Frequency of Communication with Friends and Family: Not on file  . Frequency of Social Gatherings with Friends and Family: Not on file  . Attends Religious Services: Not on file  . Active Member of Clubs or Organizations: Not on file  . Attends Archivist Meetings: Not on file  . Marital Status: Not on file     Family History: The patient's family history includes Diabetes in her mother; Hypertension in her sister; Pulmonary embolism in her sister.  ROS:   Please see the history of present illness.    She has been having nosebleeding.  Concerned about her recent blood work and not knowing the results.  All other systems reviewed and are negative.  EKGs/Labs/Other Studies Reviewed:    The following studies were reviewed today: No new imaging data  EKG:  EKG not performed.  Recent Labs: 08/19/2019: NT-Pro BNP 2,961 11/26/2019: BUN 38; Creatinine, Ser 1.83; Potassium 4.5; Sodium 140  Recent Lipid Panel    Component Value Date/Time   CHOL 95 10/03/2018 0521   TRIG 45 10/03/2018 0521   HDL 26 (L) 10/03/2018 0521   CHOLHDL 3.7 10/03/2018 0521   VLDL 9 10/03/2018 0521   LDLCALC 60 10/03/2018 0521    Physical Exam:    VS:  BP 128/68   Pulse 71   Ht 5\' 4"  (1.626 m)   Wt 158 lb 12.8 oz (72 kg)   SpO2 100%   BMI 27.26 kg/m     Wt Readings from Last 3 Encounters:  02/16/20 158 lb 12.8 oz (72 kg)  11/17/19 158 lb 12.8 oz (72 kg)  08/19/19 152 lb (68.9 kg)     GEN: Compatible with age and wearing a mask. No acute distress HEENT: Normal NECK: No JVD. LYMPHATICS: No lymphadenopathy CARDIAC: Irregularly irregular RR without murmur, gallop, or edema. VASCULAR:  Normal Pulses. No bruits. RESPIRATORY:  Clear to auscultation without rales, wheezing or rhonchi  ABDOMEN: Soft, non-tender, non-distended, No pulsatile  mass, MUSCULOSKELETAL: No deformity  SKIN: Warm and dry NEUROLOGIC:  Alert and oriented x 3 PSYCHIATRIC:  Normal affect   ASSESSMENT:    1. Chronic diastolic HF (heart failure) ([place])   2. Permanent atrial fibrillation (HCC)   3. Stage 3b chronic kidney disease   4. Essential hypertension   5. Long term current use of anticoagulant therapy   6. Chronic deep vein thrombosis (DVT) of distal vein of left lower extremity ([place])   7. Educated about COVID-19 virus infection  PLAN:    In order of problems listed above:  1. Volume overload with evidence of both pulmonary and peripheral fluid retention.  Furosemide 80 mg twice daily for total of 4 doses then switch to a maintenance dose of 60 mg every day.  A BNP and basic metabolic panel will be obtained today and in 1 week.  She will need to have clinical follow-up in 2 weeks. 2. Control rate is noted on clinical exam 3. Most recent creatinine was 1.8 from December 2020. 4. Blood pressure is well controlled today.  Target 130/80 and actually at her age could be 140/80 mmHg. 5. Anticoagulation with Eliquis at lower dose due to decreased renal function and age is not causing any bleeding complications.  Continue current dose. 6. Continue anticoagulation 7. COVID-19 vaccine has been received.  3W's is being employed.   Medication Adjustments/Labs and Tests Ordered: Current medicines are reviewed at length with the patient today.  Concerns regarding medicines are outlined above.  No orders of the defined types were placed in this encounter.  No orders of the defined types were placed in this encounter.   There are no Patient Instructions on file for this visit.   Signed, Sinclair Grooms, MD  02/16/2020 9:37 AM    [facility]

## 2020-02-16 ENCOUNTER — Other Ambulatory Visit: Payer: Self-pay

## 2020-02-16 ENCOUNTER — Encounter: Payer: Self-pay | Admitting: Interventional Cardiology

## 2020-02-16 ENCOUNTER — Ambulatory Visit: Payer: Medicare Other | Admitting: Interventional Cardiology

## 2020-02-16 VITALS — BP 128/68 | HR 71 | Ht 64.0 in | Wt 158.8 lb

## 2020-02-16 DIAGNOSIS — I5032 Chronic diastolic (congestive) heart failure: Secondary | ICD-10-CM

## 2020-02-16 DIAGNOSIS — Z7901 Long term (current) use of anticoagulants: Secondary | ICD-10-CM

## 2020-02-16 DIAGNOSIS — I4821 Permanent atrial fibrillation: Secondary | ICD-10-CM

## 2020-02-16 DIAGNOSIS — Z7189 Other specified counseling: Secondary | ICD-10-CM

## 2020-02-16 DIAGNOSIS — I825Z2 Chronic embolism and thrombosis of unspecified deep veins of left distal lower extremity: Secondary | ICD-10-CM

## 2020-02-16 DIAGNOSIS — N1832 Chronic kidney disease, stage 3b: Secondary | ICD-10-CM

## 2020-02-16 DIAGNOSIS — I10 Essential (primary) hypertension: Secondary | ICD-10-CM

## 2020-02-16 MED ORDER — FUROSEMIDE 40 MG PO TABS: 60.0000 mg | ORAL_TABLET | Freq: Every day | ORAL | 3 refills | Status: DC

## 2020-02-16 NOTE — Patient Instructions (Signed)
Medication Instructions:  1) TAKE Furosemide 80mg  twice daily for 2 days, then decrease to 60mg  once daily thereafter.   *If you need a refill on your cardiac medications before your next appointment, please call your pharmacy*   Lab Work: BMET and Pro BNP today  If you have labs (blood work) drawn today and your tests are completely normal, you will receive your results only by: Marland Kitchen MyChart Message (if you have MyChart) OR . A paper copy in the mail If you have any lab test that is abnormal or we need to change your treatment, we will call you to review the results.   Testing/Procedures: None   Follow-Up: At [facility], you and your health needs are our priority.  As part of our continuing mission to provide you with exceptional heart care, we have created designated Provider Care Teams.  These Care Teams include your primary Cardiologist (physician) and Advanced Practice Providers (APPs -  Physician Assistants and Nurse Practitioners) who all work together to provide you with the care you need, when you need it.  We recommend signing up for the patient portal called "MyChart".  Sign up information is provided on this After Visit Summary.  MyChart is used to connect with patients for Virtual Visits (Telemedicine).  Patients are able to view lab/test results, encounter notes, upcoming appointments, etc.  Non-urgent messages can be sent to your provider as well.   To learn more about what you can do with MyChart, go to [email or web address].    Your next appointment:   2-3 week(s)  The format for your next appointment:   In Person  Provider:   You may see Sinclair Grooms, MD or one of the following Advanced Practice Providers on your designated Care Team:    Truitt Merle, NP  Cecilie Kicks, NP  Kathyrn Drown, NP    Other Instructions

## 2020-02-17 LAB — BASIC METABOLIC PANEL
BUN/Creatinine Ratio: 24 (ref 12–28)
BUN: 53 mg/dL — ABNORMAL HIGH (ref 8–27)
CO2: 19 mmol/L — ABNORMAL LOW (ref 20–29)
Calcium: 9.4 mg/dL (ref 8.7–10.3)
Chloride: 102 mmol/L (ref 96–106)
Creatinine, Ser: 2.2 mg/dL — ABNORMAL HIGH (ref 0.57–1.00)
GFR calc Af Amer: 23 mL/min/{1.73_m2} — ABNORMAL LOW (ref >59)
GFR calc non Af Amer: 20 mL/min/{1.73_m2} — ABNORMAL LOW (ref >59)
Glucose: 135 mg/dL — ABNORMAL HIGH (ref 65–99)
Potassium: 4.4 mmol/L (ref 3.5–5.2)
Sodium: 137 mmol/L (ref 134–144)

## 2020-02-17 LAB — PRO B NATRIURETIC PEPTIDE: NT-Pro BNP: 2897 pg/mL — ABNORMAL HIGH (ref 0–738)

## 2020-02-18 ENCOUNTER — Other Ambulatory Visit: Payer: Self-pay | Admitting: *Deleted

## 2020-02-18 DIAGNOSIS — N1832 Chronic kidney disease, stage 3b: Secondary | ICD-10-CM

## 2020-02-18 DIAGNOSIS — I5032 Chronic diastolic (congestive) heart failure: Secondary | ICD-10-CM

## 2020-02-21 ENCOUNTER — Other Ambulatory Visit: Payer: Self-pay

## 2020-02-21 ENCOUNTER — Other Ambulatory Visit: Payer: Medicare Other | Admitting: *Deleted

## 2020-02-21 DIAGNOSIS — I5032 Chronic diastolic (congestive) heart failure: Secondary | ICD-10-CM

## 2020-02-21 DIAGNOSIS — N1832 Chronic kidney disease, stage 3b: Secondary | ICD-10-CM

## 2020-02-22 LAB — BASIC METABOLIC PANEL
BUN/Creatinine Ratio: 26 (ref 12–28)
BUN: 54 mg/dL — ABNORMAL HIGH (ref 8–27)
CO2: 21 mmol/L (ref 20–29)
Calcium: 9.3 mg/dL (ref 8.7–10.3)
Chloride: 102 mmol/L (ref 96–106)
Creatinine, Ser: 2.11 mg/dL — ABNORMAL HIGH (ref 0.57–1.00)
GFR calc Af Amer: 24 mL/min/{1.73_m2} — ABNORMAL LOW (ref >59)
GFR calc non Af Amer: 21 mL/min/{1.73_m2} — ABNORMAL LOW (ref >59)
Glucose: 143 mg/dL — ABNORMAL HIGH (ref 65–99)
Potassium: 4.4 mmol/L (ref 3.5–5.2)
Sodium: 139 mmol/L (ref 134–144)

## 2020-02-23 ENCOUNTER — Other Ambulatory Visit: Payer: Self-pay | Admitting: *Deleted

## 2020-02-23 DIAGNOSIS — N1832 Chronic kidney disease, stage 3b: Secondary | ICD-10-CM

## 2020-03-01 NOTE — Progress Notes (Signed)
CARDIOLOGY OFFICE NOTE  Date:  03/06/2020    Jasmine [name] Date of Birth: July 01, 1934 Medical Record #[id number]  PCP:  [name], MD  Cardiologist:  Tamala Julian    Chief Complaint  Patient presents with  . Follow-up    Seen for Dr. Tamala Julian    History of Present Illness: Jasmine [name] is a 86 y.o. female who presents today for a 3 week check. Seen for Dr. Tamala Julian.   She has a history of persistent AF, diastolic HF, CKD stage III, prior TIA and HTN.   Last seen here 3 weeks ago by Dr. Tamala Julian - seemed to not be feeling well. Having more swelling and DOE. Weight was up. Lasix was increased for a few days.   The patient does not have symptoms concerning for COVID-19 infection (fever, chills, cough, or new shortness of breath).   Comes in today. Here with her twin sister Jasmine [name]. She notes she is doing much better. Weight is down 11 pounds. Swelling has improved.  Her follow up BMET showed that her kidney function looked better. She is not short of breath. Rarely dizzy/lightheaded - will sit for a few seconds longer and this will typically pass. No chest pain. She feels like she is doing much better. No bleeding but does bruise easily.   Past Medical History:  Diagnosis Date  . Allergic rhinitis due to pollen   . Atrial fibrillation with rapid ventricular response ([place])   . Congestive heart failure, unspecified    patient states she is not aware of this  . Dizziness   . DVT (deep venous thrombosis) (HCC) left leg  . Essential hypertension, malignant   . GERD (gastroesophageal reflux disease)   . Heart murmur   . Insulin resistance    History of insulin resistance.  . Insulin resistance   . Obesity   . Pure hypercholesterolemia   . Vertigo     Past Surgical History:  Procedure Laterality Date  . APPENDECTOMY    . BREAST BIOPSY    . CHOLECYSTECTOMY    . PARTIAL HYSTERECTOMY       Medications: Current Meds  Medication Sig  . amLODipine (NORVASC) 5 MG tablet Take  1 tablet (5 mg total) by mouth daily.  Marland Kitchen atenolol (TENORMIN) 50 MG tablet Take 1 tablet by mouth once daily  . colchicine 0.6 MG tablet Take 0.6 mg by mouth daily.  Marland Kitchen ELIQUIS 2.5 MG TABS tablet Take 1 tablet by mouth 2 (two) times daily.  . furosemide (LASIX) 40 MG tablet Take 1.5 tablets (60 mg total) by mouth daily.  . irbesartan (AVAPRO) 75 MG tablet Take 1 tablet (75 mg total) by mouth daily.  . meclizine (ANTIVERT) 25 MG tablet Take 12.5 mg by mouth 3 (three) times daily as needed for dizziness.   . sodium chloride (OCEAN) 0.65 % SOLN nasal spray Place 1 spray into both nostrils as needed for congestion.  Marland Kitchen spironolactone (ALDACTONE) 25 MG tablet Take 0.5 tablets (12.5 mg total) by mouth daily.     Allergies: Allergies  Allergen Reactions  . Codeine   . Lovenox [Enoxaparin Sodium]   . Norvasc [Amlodipine Besylate]   . Sulfa Antibiotics   . Latex Rash    Social History: The patient  reports that she has never smoked. She has never used smokeless tobacco. She reports that she does not drink alcohol or use drugs.   Family History: The patient's family history includes Diabetes in her mother; Hypertension in  her sister; Pulmonary embolism in her sister.   Review of Systems: Please see the history of present illness.   All other systems are reviewed and negative.   Physical Exam: VS:  BP 122/74   Pulse 80   Ht 5\' 4"  (1.626 m)   Wt 149 lb 12.8 oz (67.9 kg)   SpO2 98%   BMI 25.71 kg/m  .  BMI Body mass index is 25.71 kg/m.  Wt Readings from Last 3 Encounters:  03/06/20 149 lb 12.8 oz (67.9 kg)  02/16/20 158 lb 12.8 oz (72 kg)  11/17/19 158 lb 12.8 oz (72 kg)    General: Pleasant. Elderly. Alert and in no acute distress.   Cardiac: Irregular irregular rhythm. Rate is fine. Reported improved edema.   Respiratory:  Lungs are clear to auscultation bilaterally with normal work of breathing.  GI: Soft and nontender.  MS: No deformity or atrophy. Gait and ROM intact.  Skin:  Warm and dry. Color is normal.  Neuro:  Strength and sensation are intact and no gross focal deficits noted.  Psych: Alert, appropriate and with normal affect.   LABORATORY DATA:  EKG:  EKG is not ordered today.    Lab Results  Component Value Date   WBC 6.7 10/03/2018   HGB 10.0 (L) 10/03/2018   HCT 31.9 (L) 10/03/2018   PLT 154 10/03/2018   GLUCOSE 122 (H) 03/03/2020   CHOL 95 10/03/2018   TRIG 45 10/03/2018   HDL 26 (L) 10/03/2018   LDLCALC 60 10/03/2018   ALT 14 10/02/2018   AST 27 10/02/2018   NA 138 03/03/2020   K 3.9 03/03/2020   CL 101 03/03/2020   CREATININE 2.01 (H) 03/03/2020   BUN 45 (H) 03/03/2020   CO2 19 (L) 03/03/2020   TSH 7.793 (H) 06/04/2012   INR 1.64 10/02/2018   HGBA1C 6.4 (H) 10/03/2018     BNP (last 3 results) No results for input(s): BNP in the last 8760 hours.  ProBNP (last 3 results) Recent Labs    05/27/19 1027 08/19/19 1355 02/16/20 1001  PROBNP 2,619* 2,961* 2,897*     Other Studies Reviewed Today:  Echo Study Conclusions 09/2018  - Left ventricle: The cavity size was normal. Wall thickness was  increased in a pattern of mild LVH. There was focal basal  hypertrophy. Systolic function was normal. The estimated ejection  fraction was in the range of 50% to 55%. Wall motion was normal;  there were no regional wall motion abnormalities.  - Mitral valve: There was mild to moderate regurgitation.  - Left atrium: The atrium was severely dilated.  - Right atrium: The atrium was mildly dilated.  - Tricuspid valve: There was moderate regurgitation.  - Pulmonary arteries: Systolic pressure was moderately increased.  PA peak pressure: 50 mm Hg (S).    ASSESSMENT & PLAN:    1. Chronic diastolic HF - recent exacerbation - looks to be improved clinically - weight is down and she notes her swelling has improved - no real clear trigger noted.   2. Persistent AF - rate is fine - she is anticoagulated with Eliquis - low dose.     3. Stage 3b CKD - last lab from last week showed improvement - would follow.   4. HTN - BP looks good today - no changes made.   5. Chronic anticoagulation - CBC today.   6. Prior DVT - on chronic anticoagulation.   7. Anemia - probably due to CKD - recheck lab today.  8. COVID-19 Education: The signs and symptoms of COVID-19 were discussed with the patient and how to seek care for testing (follow up with PCP or arrange E-visit).  The importance of social distancing, staying at home, hand hygiene and wearing a mask when out in public were discussed today. She has been vaccinated.   Current medicines are reviewed with the patient today.  The patient does not have concerns regarding medicines other than what has been noted above.  The following changes have been made:  See above.  Labs/ tests ordered today include:   No orders of the defined types were placed in this encounter.    Disposition:   FU with [place] in about 2 months to recheck on her. Lab today.    Patient is agreeable to this plan and will call if any problems develop in the interim.   SignedTruitt Merle, NP  03/06/2020 9:33 AM  [facility] [address] [place], [place]  [address] Phone: [phone] Fax: [phone]

## 2020-03-03 ENCOUNTER — Other Ambulatory Visit: Payer: Self-pay

## 2020-03-03 ENCOUNTER — Other Ambulatory Visit: Payer: Medicare Other | Admitting: *Deleted

## 2020-03-03 DIAGNOSIS — N1832 Chronic kidney disease, stage 3b: Secondary | ICD-10-CM

## 2020-03-03 LAB — BASIC METABOLIC PANEL
BUN/Creatinine Ratio: 22 (ref 12–28)
BUN: 45 mg/dL — ABNORMAL HIGH (ref 8–27)
CO2: 19 mmol/L — ABNORMAL LOW (ref 20–29)
Calcium: 9.7 mg/dL (ref 8.7–10.3)
Chloride: 101 mmol/L (ref 96–106)
Creatinine, Ser: 2.01 mg/dL — ABNORMAL HIGH (ref 0.57–1.00)
GFR calc Af Amer: 26 mL/min/{1.73_m2} — ABNORMAL LOW (ref >59)
GFR calc non Af Amer: 22 mL/min/{1.73_m2} — ABNORMAL LOW (ref >59)
Glucose: 122 mg/dL — ABNORMAL HIGH (ref 65–99)
Potassium: 3.9 mmol/L (ref 3.5–5.2)
Sodium: 138 mmol/L (ref 134–144)

## 2020-03-06 ENCOUNTER — Ambulatory Visit: Payer: Medicare Other | Admitting: Nurse Practitioner

## 2020-03-06 ENCOUNTER — Encounter: Payer: Self-pay | Admitting: Nurse Practitioner

## 2020-03-06 ENCOUNTER — Other Ambulatory Visit: Payer: Self-pay

## 2020-03-06 VITALS — BP 122/74 | HR 80 | Ht 64.0 in | Wt 149.8 lb

## 2020-03-06 DIAGNOSIS — I5032 Chronic diastolic (congestive) heart failure: Secondary | ICD-10-CM

## 2020-03-06 DIAGNOSIS — N1832 Chronic kidney disease, stage 3b: Secondary | ICD-10-CM | POA: Diagnosis not present

## 2020-03-06 DIAGNOSIS — I4821 Permanent atrial fibrillation: Secondary | ICD-10-CM

## 2020-03-06 DIAGNOSIS — I10 Essential (primary) hypertension: Secondary | ICD-10-CM

## 2020-03-06 DIAGNOSIS — Z7901 Long term (current) use of anticoagulants: Secondary | ICD-10-CM | POA: Diagnosis not present

## 2020-03-06 DIAGNOSIS — Z7189 Other specified counseling: Secondary | ICD-10-CM

## 2020-03-06 LAB — TSH: TSH: 7.4 u[IU]/mL — ABNORMAL HIGH (ref 0.450–4.500)

## 2020-03-06 LAB — CBC
Hematocrit: 34.5 % (ref 34.0–46.6)
Hemoglobin: 11.5 g/dL (ref 11.1–15.9)
MCH: 31.9 pg (ref 26.6–33.0)
MCHC: 33.3 g/dL (ref 31.5–35.7)
MCV: 96 fL (ref 79–97)
Platelets: 144 10*3/uL — ABNORMAL LOW (ref 150–450)
RBC: 3.6 x10E6/uL — ABNORMAL LOW (ref 3.77–5.28)
RDW: 13.8 % (ref 11.7–15.4)
WBC: 6 10*3/uL (ref 3.4–10.8)

## 2020-03-06 NOTE — Patient Instructions (Addendum)
After Visit Summary:  We will be checking the following labs today - CBC and TSH   Medication Instructions:    Continue with your current medicines.   We want you to stay on the 60 mg total of Lasix   If you need a refill on your cardiac medications before your next appointment, please call your pharmacy.     Testing/Procedures To Be Arranged:  N/A  Follow-Up:   See [place] back in 2 months    At [facility], you and your health needs are our priority.  As part of our continuing mission to provide you with exceptional heart care, we have created designated Provider Care Teams.  These Care Teams include your primary Cardiologist (physician) and Advanced Practice Providers (APPs -  Physician Assistants and Nurse Practitioners) who all work together to provide you with the care you need, when you need it.  Special Instructions:  . Stay safe, stay home, wash your hands for at least 20 seconds and wear a mask when out in public.  . It was good to talk with you today.  . Continue to restrict your salt.    Call the [place] office at [phone] if you have any questions, problems or concerns.

## 2020-03-13 ENCOUNTER — Other Ambulatory Visit: Payer: Self-pay | Admitting: Interventional Cardiology

## 2020-03-27 ENCOUNTER — Encounter: Payer: Self-pay | Admitting: Podiatry

## 2020-03-27 ENCOUNTER — Other Ambulatory Visit: Payer: Self-pay

## 2020-03-27 ENCOUNTER — Ambulatory Visit: Payer: Medicare Other | Admitting: Podiatry

## 2020-03-27 VITALS — Temp 97.0°F

## 2020-03-27 DIAGNOSIS — B351 Tinea unguium: Secondary | ICD-10-CM | POA: Diagnosis not present

## 2020-03-27 DIAGNOSIS — M79674 Pain in right toe(s): Secondary | ICD-10-CM | POA: Diagnosis not present

## 2020-03-27 DIAGNOSIS — M79675 Pain in left toe(s): Secondary | ICD-10-CM

## 2020-03-27 NOTE — Patient Instructions (Signed)

## 2020-03-30 NOTE — Progress Notes (Signed)
Subjective: Jasmine [name] presents today for follow up of at risk foot care. Patient has h/o CKD stage 3 and painful mycotic nails b/l that are difficult to trim. Pain interferes with ambulation. Aggravating factors include wearing enclosed shoe gear. Pain is relieved with periodic professional debridement.   She remains on blood thinner, Eliquis.  Her twin sister is present during today's visit. They voice no new pedal concerns on today's visit.  Allergies  Allergen Reactions  . Codeine   . Lovenox [Enoxaparin Sodium]   . Norvasc [Amlodipine Besylate]   . Sulfa Antibiotics   . Latex Rash     Objective: Vitals:   03/27/20 0948  Temp: (!) 34 F (36.1 C)    Pt 86 y.o. year old female  in NAD. AAO x 3.   Vascular Examination:  Capillary refill time to digits immediate b/l. Palpable DP pulses b/l. Palpable PT pulses b/l. Pedal hair absent b/l Skin temperature gradient within normal limits b/l. Dependent rubor noted b/l. Trace edema noted b/l feet.  Dermatological Examination: Pedal skin with normal turgor, texture and tone bilaterally. No open wounds bilaterally. No interdigital macerations bilaterally. Toenails 1-5 b/l elongated, dystrophic, thickened, crumbly with subungual debris and tenderness to dorsal palpation.  Musculoskeletal: Normal muscle strength 5/5 to all lower extremity muscle groups bilaterally, no pain crepitus or joint limitation noted with ROM b/l and hammertoes noted to the  R 2nd toe  Neurological: Protective sensation intact 5/5 intact bilaterally with 10g monofilament b/l. Vibratory sensation intact b/l. Proprioception intact bilaterally. Babinski reflex negative b/l. Clonus negative b/l.  Assessment: 1. Pain due to onychomycosis of toenails of both feet    Plan: -Toenails 1-5 b/l were debrided in length and girth with sterile nail nippers and dremel without iatrogenic bleeding.  -Patient to continue soft, supportive shoe gear daily. -Patient to report  any pedal injuries to medical professional immediately. -Patient/POA to call should there be question/concern in the interim.  Return in about 3 months (around 06/26/2020) for nail trim / Eliquis.

## 2020-04-26 NOTE — Progress Notes (Signed)
CARDIOLOGY OFFICE NOTE  Date:  05/02/2020    Jasmine [name] Date of Birth: 18-Feb-1934 Medical Record #[id number]  PCP:  [name], MD  Cardiologist:  Jennings Books  Chief Complaint  Patient presents with  . Follow-up    Seen for Dr. Tamala Julian    History of Present Illness: Jasmine [name] is a 86 y.o. female who presents today for a follow up visit. Seen for Dr. Tamala Julian.   She has a history of persistent AF, diastolic HF, CKD stage III, prior TIA and HTN.   Last seen here back in March by Dr. Tamala Julian - seemed to not be feeling well. Having more swelling and DOE. Weight was up. Lasix was increased for a few days. I then saw her for a follow up later on in March - she was improved - weight was down. Breathing had improved.   The patient does not have symptoms concerning for COVID-19 infection (fever, chills, cough, or new shortness of breath).   Comes in today. Here with [name]. Her weight is down 2 more pounds. She has been dizzy - she is not using her Meclizine - she says this is not vertigo - says she feels she needs less medicine. No chest pain. Tells me her swelling is stable. She and her sister both are very hesitant about getting the COVID vaccine.   Past Medical History:  Diagnosis Date  . Allergic rhinitis due to pollen   . Atrial fibrillation ([place]) 12/23/2011  . Atrial fibrillation with rapid ventricular response ([place])   . Chronic diastolic HF (heart failure) ([place]) 11/21/2012  . CKD (chronic kidney disease) stage 3, GFR 30-59 ml/min 04/19/2019  . Congestive heart failure, unspecified    patient states she is not aware of this  . Deep venous thrombosis of lower extremity ([place]) 06/02/2012  . Deviated septum 01/11/2020  . Dizziness   . DVT (deep venous thrombosis) ([facility]) left leg  . Epistaxis 01/11/2020  . Essential hypertension, malignant   . GERD (gastroesophageal reflux disease)   . Heart murmur   . Insulin resistance    History of insulin resistance.   . Insulin resistance   . Long term current use of anticoagulant therapy 02/08/2014   coumadin   . Obesity   . Pure hypercholesterolemia   . TIA (transient ischemic attack) 10/02/2018  . Vertigo     Past Surgical History:  Procedure Laterality Date  . APPENDECTOMY    . BREAST BIOPSY    . CHOLECYSTECTOMY    . PARTIAL HYSTERECTOMY       Medications: Current Meds  Medication Sig  . amLODipine (NORVASC) 5 MG tablet Take 1 tablet (5 mg total) by mouth daily.  Marland Kitchen atenolol (TENORMIN) 50 MG tablet Take 1 tablet by mouth once daily  . colchicine 0.6 MG tablet Take 0.6 mg by mouth daily.  Marland Kitchen ELIQUIS 2.5 MG TABS tablet Take 1 tablet by mouth 2 (two) times daily.  Arna Medici 25 MCG tablet Take 25 mcg by mouth daily.  . furosemide (LASIX) 40 MG tablet Take 1 tablet (40 mg total) by mouth daily.  . irbesartan (AVAPRO) 75 MG tablet Take 1 tablet (75 mg total) by mouth daily.  . meclizine (ANTIVERT) 25 MG tablet Take 12.5 mg by mouth 3 (three) times daily as needed for dizziness.   . sodium chloride (OCEAN) 0.65 % SOLN nasal spray Place 1 spray into both nostrils as needed for congestion.  Marland Kitchen spironolactone (ALDACTONE) 25  MG tablet Take 0.5 tablets (12.5 mg total) by mouth daily.  . [DISCONTINUED] furosemide (LASIX) 40 MG tablet Take 1.5 tablets (60 mg total) by mouth daily.     Allergies: Allergies  Allergen Reactions  . Codeine   . Lovenox [Enoxaparin Sodium]   . Norvasc [Amlodipine Besylate]   . Sulfa Antibiotics   . Latex Rash    Social History: The patient  reports that she has never smoked. She has never used smokeless tobacco. She reports that she does not drink alcohol or use drugs.   Family History: The patient's family history includes Diabetes in her mother; Hypertension in her sister; Pulmonary embolism in her sister.   Review of Systems: Please see the history of present illness.   All other systems are reviewed and negative.   Physical Exam: VS:  BP 130/70   Pulse 75    Ht 5\' 4"  (1.626 m)   Wt 147 lb (66.7 kg)   SpO2 98%   BMI 25.23 kg/m  .  BMI Body mass index is 25.23 kg/m.  Wt Readings from Last 3 Encounters:  05/02/20 147 lb (66.7 kg)  03/06/20 149 lb 12.8 oz (67.9 kg)  02/16/20 158 lb 12.8 oz (72 kg)    General: Pleasant. Alert and in no acute distress.  Weight is down 2 pounds.  Cardiac: Irregular irregular rhythm. Rate is ok.  Trace edema.  Respiratory:  Lungs are clear to auscultation bilaterally with normal work of breathing.  GI: Soft and nontender.  MS: No deformity or atrophy. Gait and ROM intact.  Skin: Warm and dry. Color is normal.  Neuro:  Strength and sensation are intact and no gross focal deficits noted.  Psych: Alert, appropriate and with normal affect.   LABORATORY DATA:  EKG:  EKG is not ordered today.   Lab Results  Component Value Date   WBC 6.0 03/06/2020   HGB 11.5 03/06/2020   HCT 34.5 03/06/2020   PLT 144 (L) 03/06/2020   GLUCOSE 122 (H) 03/03/2020   CHOL 95 10/03/2018   TRIG 45 10/03/2018   HDL 26 (L) 10/03/2018   LDLCALC 60 10/03/2018   ALT 14 10/02/2018   AST 27 10/02/2018   NA 138 03/03/2020   K 3.9 03/03/2020   CL 101 03/03/2020   CREATININE 2.01 (H) 03/03/2020   BUN 45 (H) 03/03/2020   CO2 19 (L) 03/03/2020   TSH 7.400 (H) 03/06/2020   INR 1.64 10/02/2018   HGBA1C 6.4 (H) 10/03/2018     BNP (last 3 results) No results for input(s): BNP in the last 8760 hours.  ProBNP (last 3 results) Recent Labs    05/27/19 1027 08/19/19 1355 02/16/20 1001  PROBNP 2,619* 2,961* 2,897*     Other Studies Reviewed Today:  Echo Study Conclusions 09/2018  - Left ventricle: The cavity size was normal. Wall thickness was  increased in a pattern of mild LVH. There was focal basal  hypertrophy. Systolic function was normal. The estimated ejection  fraction was in the range of 50% to 55%. Wall motion was normal;  there were no regional wall motion abnormalities.  - Mitral valve: There was  mild to moderate regurgitation.  - Left atrium: The atrium was severely dilated.  - Right atrium: The atrium was mildly dilated.  - Tricuspid valve: There was moderate regurgitation.  - Pulmonary arteries: Systolic pressure was moderately increased.  PA peak pressure: 50 mm Hg (S).    ASSESSMENT & PLAN:   1. Chronic diastolic HF -  she feels little more dizzy - will cut Lasix back to just 40 mg. Recheck her lab. Overall I think she looks pretty good. Unclear how much salt they both get.   2. Persistent AF - rate is fine - she remains on anticoagulation with Eliquis - on the lower dose due to age and creatinine.   3. Chronic anticoagulation - no problems noted.   4. HTN - BP is fine today. No changes made.   5. Prior DVT - remains on chronic anticoagulation  6. Anemia - last lab stable  7. Hypothyroid - recheck TSH.   8. CKD - recheck lab today.   9. COVID-19 Education: The signs and symptoms of COVID-19 were discussed with the patient and how to seek care for testing (follow up with PCP or arrange E-visit).  The importance of social distancing, staying at home, hand hygiene and wearing a mask when out in public were discussed today. Encouraged them both to get a vaccine.   Current medicines are reviewed with the patient today.  The patient does not have concerns regarding medicines other than what has been noted above.  The following changes have been made:  See above.  Labs/ tests ordered today include:    Orders Placed This Encounter  Procedures  . Basic metabolic panel  . TSH     Disposition:   FU with Dr. Tamala Julian in July - will schedule with her sister's visit. Lab today. Lasix cut back. Salt restriction imperative.    Patient is agreeable to this plan and will call if any problems develop in the interim.   SignedTruitt Merle, NP  05/02/2020 9:46 AM  [facility] [address] [place], [place]  [address] Phone:  [phone] Fax: [phone]

## 2020-05-02 ENCOUNTER — Ambulatory Visit: Payer: Medicare Other | Admitting: Nurse Practitioner

## 2020-05-02 ENCOUNTER — Encounter: Payer: Self-pay | Admitting: Nurse Practitioner

## 2020-05-02 ENCOUNTER — Other Ambulatory Visit: Payer: Self-pay

## 2020-05-02 VITALS — BP 130/70 | HR 75 | Ht 64.0 in | Wt 147.0 lb

## 2020-05-02 DIAGNOSIS — E039 Hypothyroidism, unspecified: Secondary | ICD-10-CM | POA: Diagnosis not present

## 2020-05-02 DIAGNOSIS — Z7189 Other specified counseling: Secondary | ICD-10-CM

## 2020-05-02 DIAGNOSIS — I4821 Permanent atrial fibrillation: Secondary | ICD-10-CM

## 2020-05-02 DIAGNOSIS — I10 Essential (primary) hypertension: Secondary | ICD-10-CM

## 2020-05-02 DIAGNOSIS — I5032 Chronic diastolic (congestive) heart failure: Secondary | ICD-10-CM | POA: Diagnosis not present

## 2020-05-02 DIAGNOSIS — Z7901 Long term (current) use of anticoagulants: Secondary | ICD-10-CM | POA: Diagnosis not present

## 2020-05-02 LAB — BASIC METABOLIC PANEL
BUN/Creatinine Ratio: 29 — ABNORMAL HIGH (ref 12–28)
BUN: 64 mg/dL — ABNORMAL HIGH (ref 8–27)
CO2: 18 mmol/L — ABNORMAL LOW (ref 20–29)
Calcium: 9.6 mg/dL (ref 8.7–10.3)
Chloride: 103 mmol/L (ref 96–106)
Creatinine, Ser: 2.18 mg/dL — ABNORMAL HIGH (ref 0.57–1.00)
GFR calc Af Amer: 23 mL/min/{1.73_m2} — ABNORMAL LOW (ref >59)
GFR calc non Af Amer: 20 mL/min/{1.73_m2} — ABNORMAL LOW (ref >59)
Glucose: 138 mg/dL — ABNORMAL HIGH (ref 65–99)
Potassium: 4.5 mmol/L (ref 3.5–5.2)
Sodium: 138 mmol/L (ref 134–144)

## 2020-05-02 LAB — TSH: TSH: 3.44 u[IU]/mL (ref 0.450–4.500)

## 2020-05-02 MED ORDER — FUROSEMIDE 40 MG PO TABS: 40.0000 mg | ORAL_TABLET | Freq: Every day | ORAL | 3 refills | Status: DC

## 2020-05-02 NOTE — Patient Instructions (Addendum)
After Visit Summary:  We will be checking the following labs today - BMET and TSH   Medication Instructions:    Continue with your current medicines. BUT  Let's cut the Furosemide back to just 40 mg a day   If you need a refill on your cardiac medications before your next appointment, please call your pharmacy.     Testing/Procedures To Be Arranged:  N/A  Follow-Up:   See Dr. Tamala Julian in July    At [facility], you and your health needs are our priority.  As part of our continuing mission to provide you with exceptional heart care, we have created designated Provider Care Teams.  These Care Teams include your primary Cardiologist (physician) and Advanced Practice Providers (APPs -  Physician Assistants and Nurse Practitioners) who all work together to provide you with the care you need, when you need it.  Special Instructions:  . Stay safe, stay home, wash your hands for at least 20 seconds and wear a mask when out in public.  . It was good to talk with you today.    Call the [place] office at [phone] if you have any questions, problems or concerns.

## 2020-05-03 ENCOUNTER — Other Ambulatory Visit: Payer: Self-pay | Admitting: *Deleted

## 2020-05-03 DIAGNOSIS — Z7901 Long term (current) use of anticoagulants: Secondary | ICD-10-CM

## 2020-05-03 DIAGNOSIS — I5032 Chronic diastolic (congestive) heart failure: Secondary | ICD-10-CM

## 2020-05-03 DIAGNOSIS — N1831 Chronic kidney disease, stage 3a: Secondary | ICD-10-CM

## 2020-05-03 MED ORDER — FUROSEMIDE 40 MG PO TABS: 20.0000 mg | ORAL_TABLET | Freq: Every day | ORAL | 3 refills | Status: DC

## 2020-05-17 ENCOUNTER — Other Ambulatory Visit: Payer: Medicare Other

## 2020-05-17 ENCOUNTER — Other Ambulatory Visit: Payer: Self-pay

## 2020-05-17 DIAGNOSIS — I5032 Chronic diastolic (congestive) heart failure: Secondary | ICD-10-CM

## 2020-05-17 DIAGNOSIS — Z7901 Long term (current) use of anticoagulants: Secondary | ICD-10-CM

## 2020-05-17 DIAGNOSIS — N1831 Chronic kidney disease, stage 3a: Secondary | ICD-10-CM

## 2020-05-17 LAB — BASIC METABOLIC PANEL
BUN/Creatinine Ratio: 24 (ref 12–28)
BUN: 57 mg/dL — ABNORMAL HIGH (ref 8–27)
CO2: 16 mmol/L — ABNORMAL LOW (ref 20–29)
Calcium: 9.6 mg/dL (ref 8.7–10.3)
Chloride: 108 mmol/L — ABNORMAL HIGH (ref 96–106)
Creatinine, Ser: 2.37 mg/dL — ABNORMAL HIGH (ref 0.57–1.00)
GFR calc Af Amer: 21 mL/min/{1.73_m2} — ABNORMAL LOW (ref >59)
GFR calc non Af Amer: 18 mL/min/{1.73_m2} — ABNORMAL LOW (ref >59)
Glucose: 103 mg/dL — ABNORMAL HIGH (ref 65–99)
Potassium: 4.9 mmol/L (ref 3.5–5.2)
Sodium: 140 mmol/L (ref 134–144)

## 2020-05-18 ENCOUNTER — Other Ambulatory Visit: Payer: Self-pay | Admitting: *Deleted

## 2020-05-18 DIAGNOSIS — N1832 Chronic kidney disease, stage 3b: Secondary | ICD-10-CM

## 2020-05-22 ENCOUNTER — Other Ambulatory Visit: Payer: Medicare Other | Admitting: *Deleted

## 2020-05-22 ENCOUNTER — Other Ambulatory Visit: Payer: Self-pay

## 2020-05-22 DIAGNOSIS — N1832 Chronic kidney disease, stage 3b: Secondary | ICD-10-CM

## 2020-05-22 LAB — BASIC METABOLIC PANEL
BUN/Creatinine Ratio: 25 (ref 12–28)
BUN: 47 mg/dL — ABNORMAL HIGH (ref 8–27)
CO2: 16 mmol/L — ABNORMAL LOW (ref 20–29)
Calcium: 9.5 mg/dL (ref 8.7–10.3)
Chloride: 109 mmol/L — ABNORMAL HIGH (ref 96–106)
Creatinine, Ser: 1.89 mg/dL — ABNORMAL HIGH (ref 0.57–1.00)
GFR calc Af Amer: 28 mL/min/{1.73_m2} — ABNORMAL LOW (ref >59)
GFR calc non Af Amer: 24 mL/min/{1.73_m2} — ABNORMAL LOW (ref >59)
Glucose: 107 mg/dL — ABNORMAL HIGH (ref 65–99)
Potassium: 4.4 mmol/L (ref 3.5–5.2)
Sodium: 139 mmol/L (ref 134–144)

## 2020-05-30 ENCOUNTER — Other Ambulatory Visit: Payer: Self-pay | Admitting: Interventional Cardiology

## 2020-06-26 ENCOUNTER — Other Ambulatory Visit: Payer: Self-pay

## 2020-06-26 ENCOUNTER — Ambulatory Visit: Payer: Medicare Other | Admitting: Podiatry

## 2020-06-26 ENCOUNTER — Encounter: Payer: Self-pay | Admitting: Podiatry

## 2020-06-26 DIAGNOSIS — M79674 Pain in right toe(s): Secondary | ICD-10-CM

## 2020-06-26 DIAGNOSIS — N1832 Chronic kidney disease, stage 3b: Secondary | ICD-10-CM

## 2020-06-26 DIAGNOSIS — R600 Localized edema: Secondary | ICD-10-CM

## 2020-06-26 DIAGNOSIS — B351 Tinea unguium: Secondary | ICD-10-CM

## 2020-06-26 DIAGNOSIS — M79675 Pain in left toe(s): Secondary | ICD-10-CM | POA: Diagnosis not present

## 2020-06-26 NOTE — Progress Notes (Signed)
Subjective: Jasmine [name] presents today for follow up of at risk foot care. Patient has h/o CKD stage 3 and painful mycotic nails b/l that are difficult to trim. Pain interferes with ambulation. Aggravating factors include wearing enclosed shoe gear. Pain is relieved with periodic professional debridement.   Patient states she will be seeing a kidney specialist.   She remains on blood thinner, Eliquis.  Her twin sister, Jasmine [name],  is present during today's visit.  Her PCP is Dr. Kevan Ny.  Allergies  Allergen Reactions  . Codeine   . Lovenox [Enoxaparin Sodium]   . Norvasc [Amlodipine Besylate]   . Sulfa Antibiotics   . Latex Rash     Objective: There were no vitals filed for this visit.  Pt is a pleasant 86 y.o. year old African American female  in NAD. AAO x 3.   Vascular Examination:  Capillary refill time to digits immediate b/l. Palpable pedal pulses b/l LE. Pedal hair absent. Lower extremity skin temperature gradient within normal limits. Dependent rubor noted b/l lower extremities. No pain with calf compression b/l. Nonpitting edema noted b/l lower extremities. Evidence of chronic venous insufficiency b/l LE.  Dermatological Examination: Pedal skin with normal turgor, texture and tone bilaterally. No open wounds bilaterally. No interdigital macerations bilaterally. Toenails 1-5 b/l elongated, dystrophic, thickened, crumbly with subungual debris and tenderness to dorsal palpation.  Musculoskeletal: Normal muscle strength 5/5 to all lower extremity muscle groups bilaterally, no pain crepitus or joint limitation noted with ROM b/l and hammertoes noted to the  R 2nd toe  Neurological: Protective sensation intact 5/5 intact bilaterally with 10g monofilament b/l. Vibratory sensation intact b/l. Proprioception intact bilaterally. Babinski reflex negative b/l. Clonus negative b/l.  Assessment: 1. Pain due to onychomycosis of toenails of both feet   2. Lower extremity edema    3. Stage 3b chronic kidney disease    Plan: -Examined patient. -No new findings. No new orders. -Toenails 1-5 b/l were debrided in length and girth with sterile nail nippers and dremel without iatrogenic bleeding.  -Patient to report any pedal injuries to medical professional immediately. -Patient to continue soft, supportive shoe gear daily. -Patient/POA to call should there be question/concern in the interim.  Return in about 3 months (around 09/26/2020) for nail trim.

## 2020-07-03 NOTE — Progress Notes (Signed)
Cardiology Office Note:    Date:  07/05/2020   ID:  Jasmine [name], DOB 10-Feb-1934, MRN [id number]  PCP:  [name], MD  Cardiologist:  Sinclair Grooms, MD   Referring MD: [name], MD   Chief Complaint  Patient presents with  . Congestive Heart Failure  . Atrial Fibrillation    History of Present Illness:    Jasmine [name] is a 86 y.o. female with a hx of chronicatrial fibrillation,CHADSVASC> 2,diastolic heart failure,CKD 3, TIA,and hypertension. Twin sister has PAF and CDHF.  Jasmine [name] is here today with large swollen bilateral lower extremities, markedly elevated neck veins to the angle of the jaw while sitting, and complaining of shortness of breath.  She developed progressive shortness of breath and swelling after diuretic therapy was discontinued June.  This was done because the BUN and creatinine were elevated.  Over the past month, she has gained 20 pounds.  She does not feel well.  She denies chest pain.  She has an appointment to see nephrology on Friday.  I will be sure to forward today's note to the nephrologist.  We will try to figure out which nephrologist she will be seeing.  We will also forward the information to Dr. Kevan Ny.  Past Medical History:  Diagnosis Date  . Allergic rhinitis due to pollen   . Atrial fibrillation ([place]) 12/23/2011  . Atrial fibrillation with rapid ventricular response ([place])   . Chronic diastolic HF (heart failure) ([place]) 11/21/2012  . CKD (chronic kidney disease) stage 3, GFR 30-59 ml/min 04/19/2019  . Congestive heart failure, unspecified    patient states she is not aware of this  . Deep venous thrombosis of lower extremity ([place]) 06/02/2012  . Deviated septum 01/11/2020  . Dizziness   . DVT (deep venous thrombosis) ([facility]) left leg  . Epistaxis 01/11/2020  . Essential hypertension, malignant   . GERD (gastroesophageal reflux disease)   . Heart murmur   . Insulin resistance    History of insulin resistance.  . Insulin  resistance   . Long term current use of anticoagulant therapy 02/08/2014   coumadin   . Obesity   . Pure hypercholesterolemia   . TIA (transient ischemic attack) 10/02/2018  . Vertigo     Past Surgical History:  Procedure Laterality Date  . APPENDECTOMY    . BREAST BIOPSY    . CHOLECYSTECTOMY    . PARTIAL HYSTERECTOMY      Current Medications: Current Meds  Medication Sig  . amLODipine (NORVASC) 5 MG tablet Take 1 tablet (5 mg total) by mouth daily.  Marland Kitchen atenolol (TENORMIN) 50 MG tablet Take 1 tablet by mouth once daily  . ELIQUIS 2.5 MG TABS tablet Take 1 tablet by mouth 2 (two) times daily.  Arna Medici 25 MCG tablet Take 25 mcg by mouth daily.  . irbesartan (AVAPRO) 75 MG tablet Take 1 tablet (75 mg total) by mouth daily.  . meclizine (ANTIVERT) 25 MG tablet Take 12.5 mg by mouth 3 (three) times daily as needed for dizziness.   . sodium chloride (OCEAN) 0.65 % SOLN nasal spray Place 1 spray into both nostrils as needed for congestion.  . [DISCONTINUED] amLODipine (NORVASC) 5 MG tablet Take 1 tablet by mouth once daily     Allergies:   Codeine, Lovenox [enoxaparin sodium], Norvasc [amlodipine besylate], Sulfa antibiotics, and Latex   Social History   Socioeconomic History  . Marital status: Single    Spouse name: Not on file  .  Number of children: Not on file  . Years of education: Not on file  . Highest education level: Not on file  Occupational History  . Not on file  Tobacco Use  . Smoking status: Never Smoker  . Smokeless tobacco: Never Used  Substance and Sexual Activity  . Alcohol use: No  . Drug use: No  . Sexual activity: Not Currently    Birth control/protection: Post-menopausal  Other Topics Concern  . Not on file  Social History Narrative  . Not on file   Social Determinants of Health   Financial Resource Strain:   . Difficulty of Paying Living Expenses:   Food Insecurity:   . Worried About Charity fundraiser in the Last Year:   . Arboriculturist  in the Last Year:   Transportation Needs:   . Film/video editor (Medical):   Marland Kitchen Lack of Transportation (Non-Medical):   Physical Activity:   . Days of Exercise per Week:   . Minutes of Exercise per Session:   Stress:   . Feeling of Stress :   Social Connections:   . Frequency of Communication with Friends and Family:   . Frequency of Social Gatherings with Friends and Family:   . Attends Religious Services:   . Active Member of Clubs or Organizations:   . Attends Archivist Meetings:   Marland Kitchen Marital Status:      Family History: The patient's family history includes Diabetes in her mother; Hypertension in her sister; Pulmonary embolism in her sister.  ROS:   Please see the history of present illness.    Amlodipine has not been refilled by the pharmacist.  Spironolactone and furosemide have been discontinued.  All other systems reviewed and are negative.  EKGs/Labs/Other Studies Reviewed:    The following studies were reviewed today: 2D Doppler echocardiogram 2020 Study Conclusions   - Left ventricle: The cavity size was normal. Wall thickness was  increased in a pattern of mild LVH. There was focal basal  hypertrophy. Systolic function was normal. The estimated ejection  fraction was in the range of 50% to 55%. Wall motion was normal;  there were no regional wall motion abnormalities.  - Mitral valve: There was mild to moderate regurgitation.  - Left atrium: The atrium was severely dilated.  - Right atrium: The atrium was mildly dilated.  - Tricuspid valve: There was moderate regurgitation.  - Pulmonary arteries: Systolic pressure was moderately increased.  PA peak pressure: 50 mm Hg (S).   EKG:  EKG not repeated  Recent Labs: 02/16/2020: NT-Pro BNP 2,897 03/06/2020: Hemoglobin 11.5; Platelets 144 05/02/2020: TSH 3.440 05/22/2020: BUN 47; Creatinine, Ser 1.89; Potassium 4.4; Sodium 139  Recent Lipid Panel    Component Value Date/Time   CHOL 95  10/03/2018 0521   TRIG 45 10/03/2018 0521   HDL 26 (L) 10/03/2018 0521   CHOLHDL 3.7 10/03/2018 0521   VLDL 9 10/03/2018 0521   LDLCALC 60 10/03/2018 0521    Physical Exam:    VS:  BP (!) 142/82   Pulse 77   Ht 5\' 4"  (1.626 m)   Wt 165 lb (74.8 kg)   SpO2 99%   BMI 28.32 kg/m     Wt Readings from Last 3 Encounters:  07/05/20 165 lb (74.8 kg)  05/02/20 147 lb (66.7 kg)  03/06/20 149 lb 12.8 oz (67.9 kg)     GEN: Elderly.  No acute distress HEENT: Normal NECK: No JVD. LYMPHATICS: No lymphadenopathy CARDIAC: Irregularly irregular  RR without murmur, gallop, or edema. VASCULAR:  Normal Pulses. No bruits. RESPIRATORY:  Clear to auscultation without rales, wheezing or rhonchi  ABDOMEN: Soft, non-tender, non-distended, No pulsatile mass, MUSCULOSKELETAL: No deformity  SKIN: Warm and dry NEUROLOGIC:  Alert and oriented x 3 PSYCHIATRIC:  Normal affect   ASSESSMENT:    1. Chronic diastolic HF (heart failure) ([place])   2. Long term current use of anticoagulant therapy   3. Stage 3a chronic kidney disease   4. Permanent atrial fibrillation ([place])   5. Essential hypertension   6. Educated about COVID-19 virus infection    PLAN:    In order of problems listed above:  1. Acute on chronic diastolic heart failure.  Resume furosemide 40 mg twice daily for 4 doses then 40 mg daily thereafter.  Basic metabolic panel today and in 2 weeks. 2. Continue reduced dose Eliquis at 2.5 mg p.o. twice daily. 3. Kidney function will be checked today.  Will be checked again in 2 weeks after resumption of diuretic therapy.  Data will be faxed to the nephrology service. 4. Continue Tenormin 50 mg/day and Eliquis 2.5 mg twice daily. 5. Volume removal will improve blood pressure. 6. Recommended COVID-19 vaccination, mRNA Pfizer vaccine.  Appraised of the new recommendation by CDC to continue masking when in public.     Clinical follow-up in 6 to 8 weeks.  Medication Adjustments/Labs and Tests  Ordered: Current medicines are reviewed at length with the patient today.  Concerns regarding medicines are outlined above.  Orders Placed This Encounter  Procedures  . Basic metabolic panel  . Pro b natriuretic peptide (BNP)  . CBC  . Basic metabolic panel   Meds ordered this encounter  Medications  . furosemide (LASIX) 40 MG tablet    Sig: Take 1 tablet (40 mg total) by mouth daily.    Dispense:  90 tablet    Refill:  3  . amLODipine (NORVASC) 5 MG tablet    Sig: Take 1 tablet (5 mg total) by mouth daily.    Dispense:  90 tablet    Refill:  3    Patient Instructions  Medication Instructions:  1. Start furosemide 40 mg, twice a day for 2 days, then start just once a day.   *If you need a refill on your cardiac medications before your next appointment, please call your pharmacy*   Lab Work: BMET, BNP, and CBC today.  BMP in two weeks.  If you have labs (blood work) drawn today and your tests are completely normal, you will receive your results only by: Marland Kitchen MyChart Message (if you have MyChart) OR . A paper copy in the mail If you have any lab test that is abnormal or we need to change your treatment, we will call you to review the results.   Testing/Procedures: None   Follow-Up: At [facility], you and your health needs are our priority.  As part of our continuing mission to provide you with exceptional heart care, we have created designated Provider Care Teams.  These Care Teams include your primary Cardiologist (physician) and Advanced Practice Providers (APPs -  Physician Assistants and Nurse Practitioners) who all work together to provide you with the care you need, when you need it.  We recommend signing up for the patient portal called "MyChart".  Sign up information is provided on this After Visit Summary.  MyChart is used to connect with patients for Virtual Visits (Telemedicine).  Patients are able to view lab/test results, encounter notes,  upcoming  appointments, etc.  Non-urgent messages can be sent to your provider as well.   To learn more about what you can do with MyChart, go to [email or web address].    Your next appointment:   6-8 week(s)  The format for your next appointment:   In Person  Provider:   You may see Sinclair Grooms, MD or one of the following Advanced Practice Providers on your designated Care Team:    Truitt Merle, NP  Cecilie Kicks, NP  Kathyrn Drown, NP         Signed, Sinclair Grooms, MD  07/05/2020 12:16 PM    [place]

## 2020-07-05 ENCOUNTER — Other Ambulatory Visit: Payer: Self-pay

## 2020-07-05 ENCOUNTER — Ambulatory Visit: Payer: Medicare Other | Admitting: Interventional Cardiology

## 2020-07-05 ENCOUNTER — Encounter: Payer: Self-pay | Admitting: Interventional Cardiology

## 2020-07-05 VITALS — BP 142/82 | HR 77 | Ht 64.0 in | Wt 165.0 lb

## 2020-07-05 DIAGNOSIS — I4821 Permanent atrial fibrillation: Secondary | ICD-10-CM

## 2020-07-05 DIAGNOSIS — Z7189 Other specified counseling: Secondary | ICD-10-CM

## 2020-07-05 DIAGNOSIS — Z7901 Long term (current) use of anticoagulants: Secondary | ICD-10-CM | POA: Diagnosis not present

## 2020-07-05 DIAGNOSIS — N1831 Chronic kidney disease, stage 3a: Secondary | ICD-10-CM | POA: Diagnosis not present

## 2020-07-05 DIAGNOSIS — I10 Essential (primary) hypertension: Secondary | ICD-10-CM

## 2020-07-05 DIAGNOSIS — I5032 Chronic diastolic (congestive) heart failure: Secondary | ICD-10-CM

## 2020-07-05 MED ORDER — AMLODIPINE BESYLATE 5 MG PO TABS: 5.0000 mg | ORAL_TABLET | Freq: Every day | ORAL | 3 refills | Status: DC

## 2020-07-05 MED ORDER — FUROSEMIDE 40 MG PO TABS
40.0000 mg | ORAL_TABLET | Freq: Every day | ORAL | 3 refills | Status: DC
Start: 2020-07-05 — End: 2020-09-07

## 2020-07-05 NOTE — Patient Instructions (Signed)
Medication Instructions:  1. Start furosemide 40 mg, twice a day for 2 days, then start just once a day.   *If you need a refill on your cardiac medications before your next appointment, please call your pharmacy*   Lab Work: BMET, BNP, and CBC today.  BMP in two weeks.  If you have labs (blood work) drawn today and your tests are completely normal, you will receive your results only by: Marland Kitchen MyChart Message (if you have MyChart) OR . A paper copy in the mail If you have any lab test that is abnormal or we need to change your treatment, we will call you to review the results.   Testing/Procedures: None   Follow-Up: At [facility], you and your health needs are our priority.  As part of our continuing mission to provide you with exceptional heart care, we have created designated Provider Care Teams.  These Care Teams include your primary Cardiologist (physician) and Advanced Practice Providers (APPs -  Physician Assistants and Nurse Practitioners) who all work together to provide you with the care you need, when you need it.  We recommend signing up for the patient portal called "MyChart".  Sign up information is provided on this After Visit Summary.  MyChart is used to connect with patients for Virtual Visits (Telemedicine).  Patients are able to view lab/test results, encounter notes, upcoming appointments, etc.  Non-urgent messages can be sent to your provider as well.   To learn more about what you can do with MyChart, go to [email or web address].    Your next appointment:   6-8 week(s)  The format for your next appointment:   In Person  Provider:   You may see Sinclair Grooms, MD or one of the following Advanced Practice Providers on your designated Care Team:    Truitt Merle, NP  Cecilie Kicks, NP  Kathyrn Drown, NP

## 2020-07-06 LAB — CBC
Hematocrit: 33 % — ABNORMAL LOW (ref 34.0–46.6)
Hemoglobin: 10.8 g/dL — ABNORMAL LOW (ref 11.1–15.9)
MCH: 31.5 pg (ref 26.6–33.0)
MCHC: 32.7 g/dL (ref 31.5–35.7)
MCV: 96 fL (ref 79–97)
Platelets: 134 10*3/uL — ABNORMAL LOW (ref 150–450)
RBC: 3.43 x10E6/uL — ABNORMAL LOW (ref 3.77–5.28)
RDW: 13 % (ref 11.7–15.4)
WBC: 5 10*3/uL (ref 3.4–10.8)

## 2020-07-06 LAB — BASIC METABOLIC PANEL
BUN/Creatinine Ratio: 18 (ref 12–28)
BUN: 27 mg/dL (ref 8–27)
CO2: 16 mmol/L — ABNORMAL LOW (ref 20–29)
Calcium: 9.3 mg/dL (ref 8.7–10.3)
Chloride: 113 mmol/L — ABNORMAL HIGH (ref 96–106)
Creatinine, Ser: 1.46 mg/dL — ABNORMAL HIGH (ref 0.57–1.00)
GFR calc Af Amer: 38 mL/min/{1.73_m2} — ABNORMAL LOW (ref >59)
GFR calc non Af Amer: 33 mL/min/{1.73_m2} — ABNORMAL LOW (ref >59)
Glucose: 140 mg/dL — ABNORMAL HIGH (ref 65–99)
Potassium: 4.5 mmol/L (ref 3.5–5.2)
Sodium: 147 mmol/L — ABNORMAL HIGH (ref 134–144)

## 2020-07-06 LAB — PRO B NATRIURETIC PEPTIDE: NT-Pro BNP: 3046 pg/mL — ABNORMAL HIGH (ref 0–738)

## 2020-07-12 ENCOUNTER — Other Ambulatory Visit: Payer: Self-pay | Admitting: Nephrology

## 2020-07-12 DIAGNOSIS — N1832 Chronic kidney disease, stage 3b: Secondary | ICD-10-CM

## 2020-07-17 ENCOUNTER — Ambulatory Visit
Admission: RE | Admit: 2020-07-17 | Discharge: 2020-07-17 | Disposition: A | Discharge status: Home / Self Care | Payer: Medicare Other | Source: Ambulatory Visit | Attending: Nephrology | Admitting: Nephrology

## 2020-07-17 DIAGNOSIS — N1832 Chronic kidney disease, stage 3b: Secondary | ICD-10-CM

## 2020-07-20 ENCOUNTER — Other Ambulatory Visit: Payer: Self-pay

## 2020-07-20 ENCOUNTER — Other Ambulatory Visit: Payer: Medicare Other

## 2020-07-20 DIAGNOSIS — I5032 Chronic diastolic (congestive) heart failure: Secondary | ICD-10-CM

## 2020-07-20 LAB — BASIC METABOLIC PANEL
BUN/Creatinine Ratio: 24 (ref 12–28)
BUN: 50 mg/dL — ABNORMAL HIGH (ref 8–27)
CO2: 21 mmol/L (ref 20–29)
Calcium: 9.8 mg/dL (ref 8.7–10.3)
Chloride: 101 mmol/L (ref 96–106)
Creatinine, Ser: 2.1 mg/dL — ABNORMAL HIGH (ref 0.57–1.00)
GFR calc Af Amer: 25 mL/min/{1.73_m2} — ABNORMAL LOW (ref >59)
GFR calc non Af Amer: 21 mL/min/{1.73_m2} — ABNORMAL LOW (ref >59)
Glucose: 101 mg/dL — ABNORMAL HIGH (ref 65–99)
Potassium: 4.8 mmol/L (ref 3.5–5.2)
Sodium: 138 mmol/L (ref 134–144)

## 2020-07-21 ENCOUNTER — Telehealth: Payer: Self-pay | Admitting: Interventional Cardiology

## 2020-07-21 ENCOUNTER — Telehealth: Payer: Self-pay

## 2020-07-21 DIAGNOSIS — I5032 Chronic diastolic (congestive) heart failure: Secondary | ICD-10-CM

## 2020-07-21 DIAGNOSIS — I10 Essential (primary) hypertension: Secondary | ICD-10-CM

## 2020-07-21 NOTE — Telephone Encounter (Signed)
-----   Message from Belva Crome, MD sent at 07/20/2020 10:17 PM EDT ----- Let the patient know kidney function is worse but stable. If breathing and swelling is better, continue diuretic. Repeat BMET 1 month. A copy will be sent to [name], MD

## 2020-07-21 NOTE — Telephone Encounter (Signed)
The patient has been notified of the result and verbalized understanding.  All questions (if any) were answered. Patient states that she has been taking Lasix 60 mg daily. She was advised to do so by her provider and [employer].  She will repeat BMET 9/13.

## 2020-07-21 NOTE — Telephone Encounter (Signed)
° ° ° °  Pt c/o medication issue:  1. Name of Medication: all medications   2. How are you currently taking this medication (dosage and times per day)?   3. Are you having a reaction (difficulty breathing--STAT)?   4. What is your medication issue? Pt would like to speak with Dr. Tamala Julian nurse to get a information about all of her current medications

## 2020-07-21 NOTE — Telephone Encounter (Signed)
I spoke with patient who is asking about furosemide dose. Per lab results patient to continue diuretic.  Patient states she saw kidney doctor on 07/07/20 and furosemide was changed to 60 mg daily.  She wanted to make sure Dr Tamala Julian was aware of this. Patient will continue furosemide 60 mg daily and come to office for lab work on 08/21/20

## 2020-07-22 NOTE — Telephone Encounter (Signed)
Tanks

## 2020-08-03 IMAGING — CT CT HEAD W/O CM
3 of 4 series · 15 of 47 positions shown, 18 images · non-contrast
Comparison: Brain MRI 12/23/2011.  Head CT 06/20/2007.

CLINICAL DATA: 84-year-old female with right leg weakness and
numbness beginning at 2944 hours.

EXAM:
CT HEAD WITHOUT CONTRAST
TECHNIQUE: Contiguous axial images were obtained from the base of the skull
through the vertex without intravenous contrast.

[Series 4: head 2.0 h70h · axial · 0.42mm/px · z∈[-101,+29]mm · 9 of 83 slices shown, 12 images]
[im 9/83  brain]
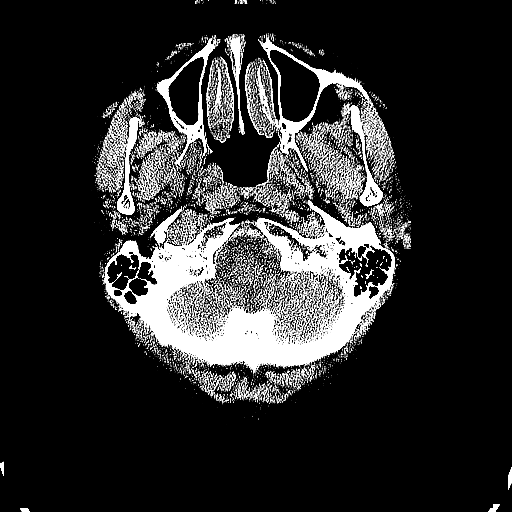
[im 9/83  bone]
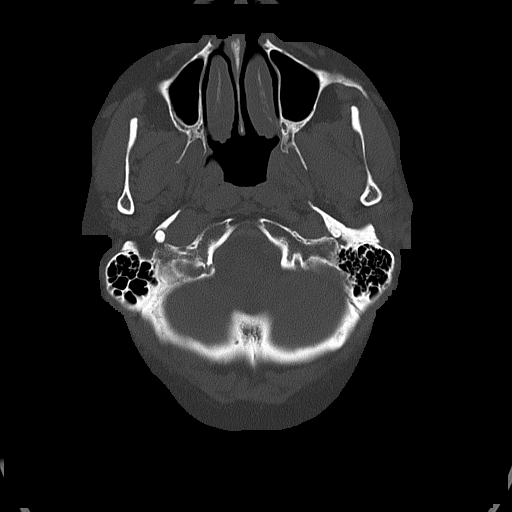
[im 17/83  brain]
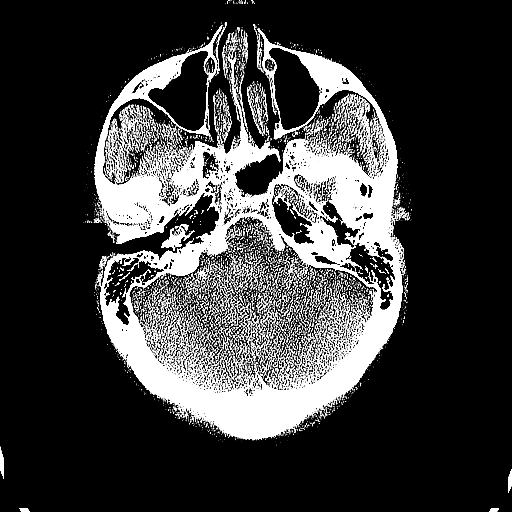
[im 25/83  brain]
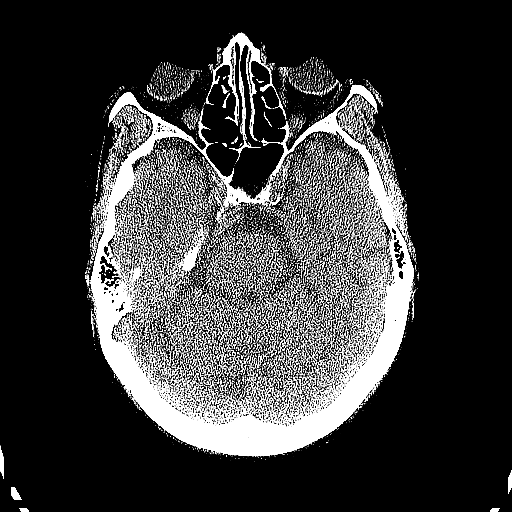
[im 33/83  brain]
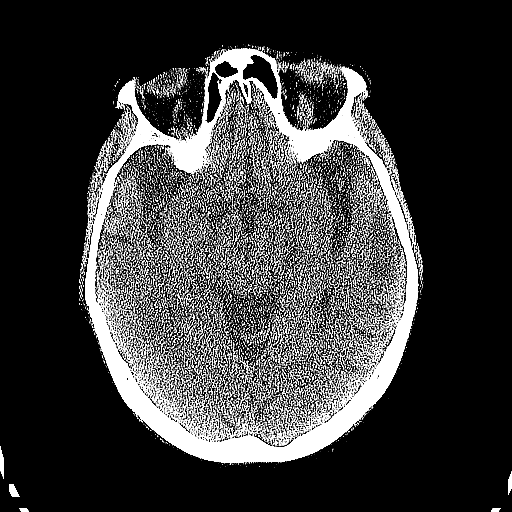
[im 42/83  brain]
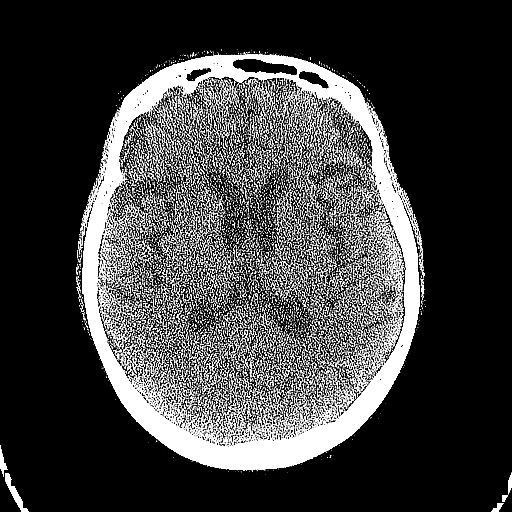
[im 42/83  bone]
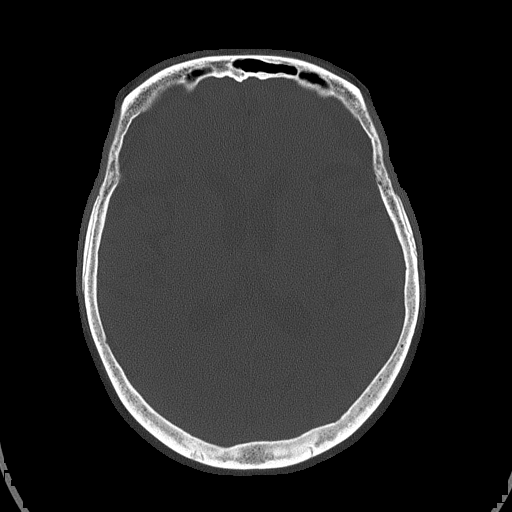
[im 50/83  brain]
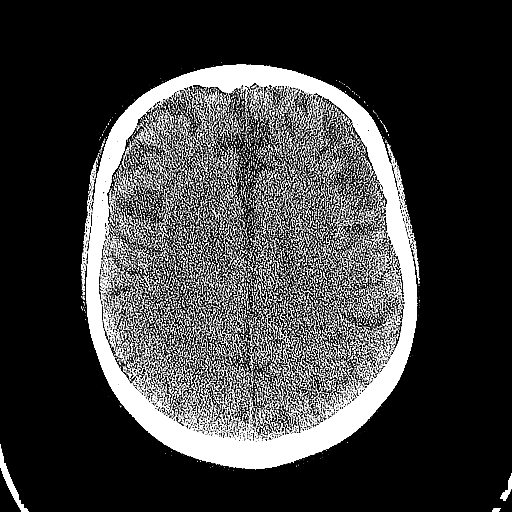
[im 58/83  brain]
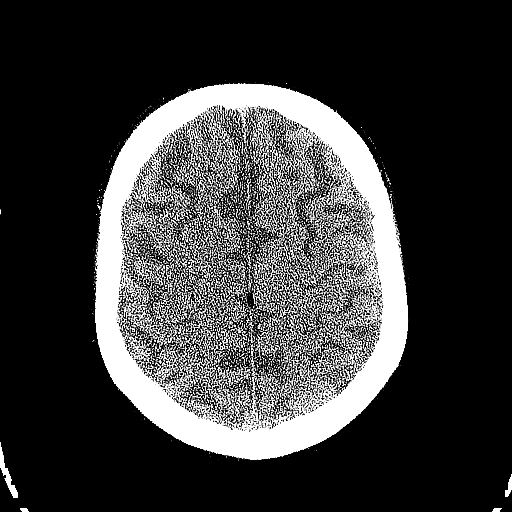
[im 66/83  brain]
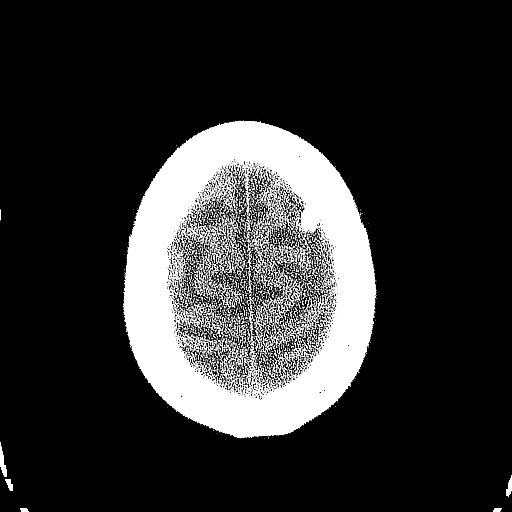
[im 74/83  brain]
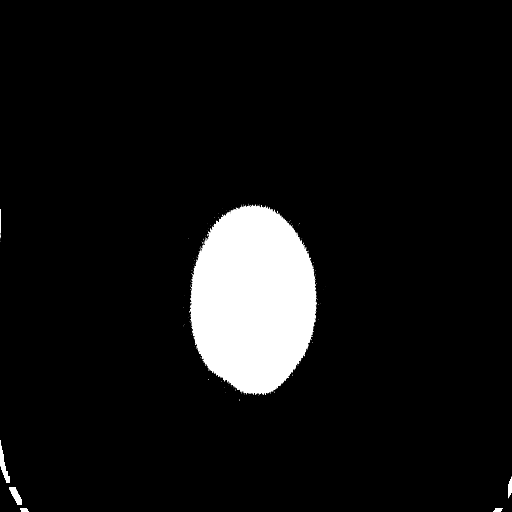
[im 74/83  bone]
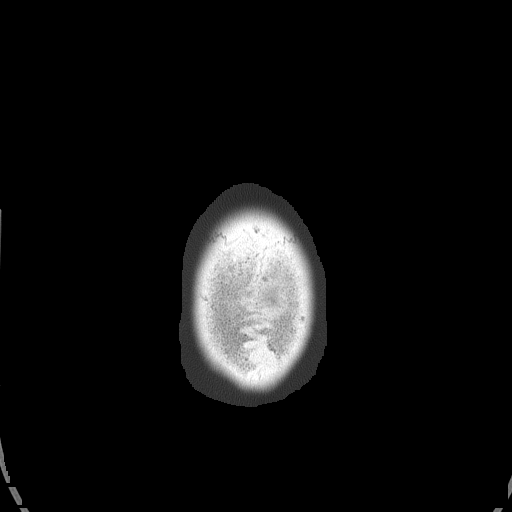

[Series 5: head 3.0 mpr cor · coronal · 0.32mm/px · 3 of 69 slices shown]
[im 23/69  brain]
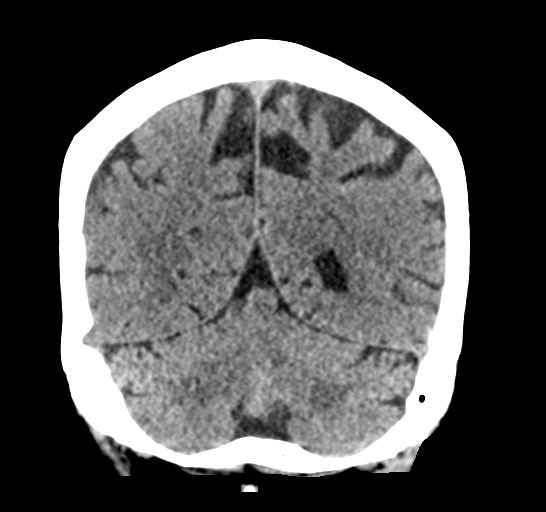
[im 31/69  brain]
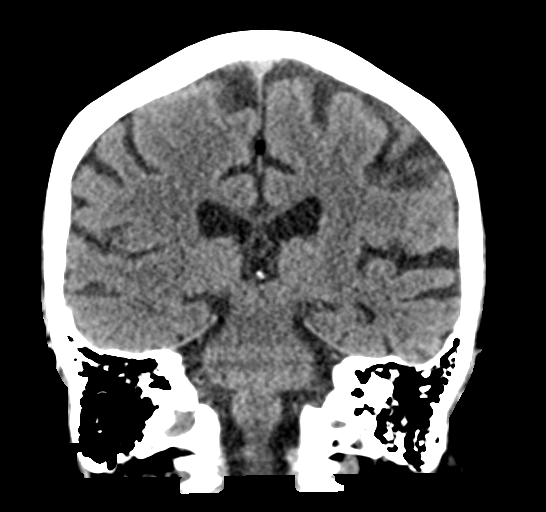
[im 38/69  brain]
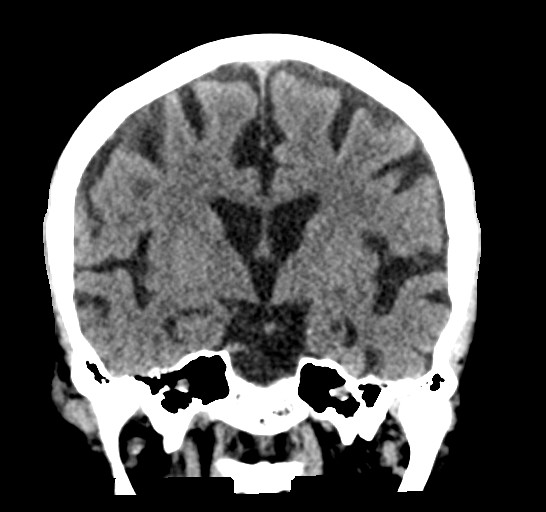

[Series 6: head 3.0 mpr sag · sagittal · 0.32mm/px · 3 of 63 slices shown]
[im 21/63  brain]
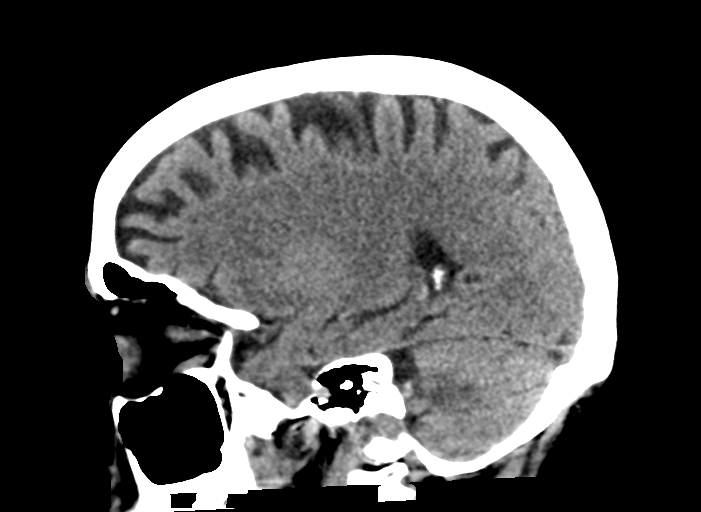
[im 32/63  brain]
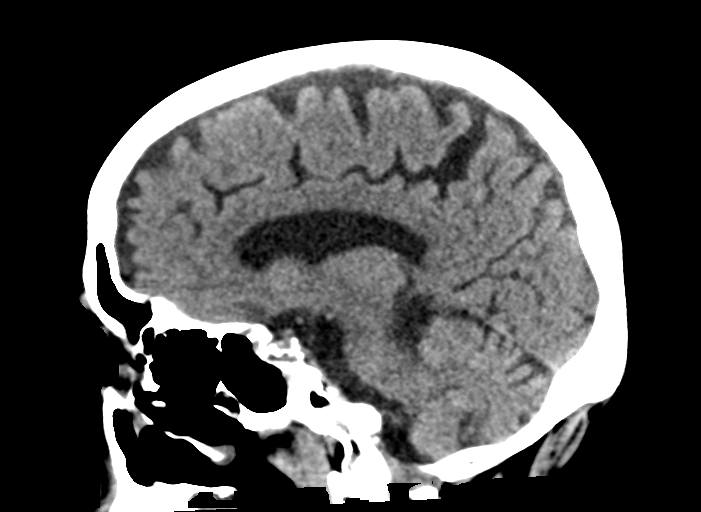
[im 42/63  brain]
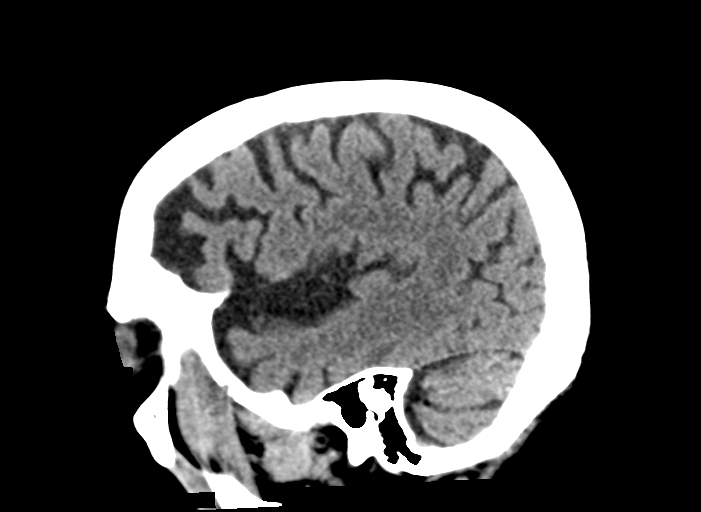

[15 of 47 positions shown; findings below may reference images not displayed]

FINDINGS: Brain: Cerebral volume is within normal limits for age. No midline
shift, ventriculomegaly, mass effect, evidence of mass lesion,
intracranial hemorrhage or evidence of cortically based acute
infarction. Normal for age gray-white matter differentiation
throughout the brain. No cortical encephalomalacia identified.

Vascular: Calcified atherosclerosis at the skull base. No suspicious
intracranial vascular hyperdensity. Dominant left vertebral artery.

Skull: Chronic 12 millimeter dural calcification along the left
vertex is unchanged since 8112. No acute osseous abnormality
identified.

Sinuses/Orbits: Visualized paranasal sinuses and mastoids are stable
since 8112 and well pneumatized.

Other: Visualized orbits and scalp soft tissues are within normal
limits.
IMPRESSION: Normal for age noncontrast CT appearance of the brain.

## 2020-08-21 ENCOUNTER — Other Ambulatory Visit: Payer: Medicare Other | Admitting: *Deleted

## 2020-08-21 ENCOUNTER — Other Ambulatory Visit: Payer: Self-pay

## 2020-08-21 DIAGNOSIS — I10 Essential (primary) hypertension: Secondary | ICD-10-CM

## 2020-08-21 DIAGNOSIS — I5032 Chronic diastolic (congestive) heart failure: Secondary | ICD-10-CM

## 2020-08-21 LAB — BASIC METABOLIC PANEL
BUN/Creatinine Ratio: 28 (ref 12–28)
BUN: 63 mg/dL — ABNORMAL HIGH (ref 8–27)
CO2: 20 mmol/L (ref 20–29)
Calcium: 9.7 mg/dL (ref 8.7–10.3)
Chloride: 100 mmol/L (ref 96–106)
Creatinine, Ser: 2.22 mg/dL — ABNORMAL HIGH (ref 0.57–1.00)
GFR calc Af Amer: 23 mL/min/{1.73_m2} — ABNORMAL LOW (ref >59)
GFR calc non Af Amer: 20 mL/min/{1.73_m2} — ABNORMAL LOW (ref >59)
Glucose: 100 mg/dL — ABNORMAL HIGH (ref 65–99)
Potassium: 4.5 mmol/L (ref 3.5–5.2)
Sodium: 134 mmol/L (ref 134–144)

## 2020-09-02 NOTE — Progress Notes (Signed)
Cardiology Office Note:    Date:  09/07/2020   ID:  Jasmine [name], DOB 1934-04-29, MRN [id number]  PCP:  Jasmine Blocker, MD  Cardiologist:  Jasmine Grooms, MD   Referring MD: Jasmine Blocker, MD   Chief Complaint  Patient presents with  . Advice Only    CKD stage IV  . Atrial Fibrillation  . Congestive Heart Failure    History of Present Illness:    Jasmine [name] is a 86 y.o. female with a hx of chronicatrial fibrillation,CHADSVASC> 2,diastolic heart failure,CKD 4, TIA,and hypertension. Twin sister has PAF and CDHF.  Thankfully, Jasmine [name] is now seeing nephrology.  She saw Dr. Harrie [name].  Dr. Royce [name] has suggested the diuretic regimen.  She is now taking furosemide 40 mg Monday, Wednesday, and Friday.  She has not had recurrent lower extremity swelling or dyspnea.  She states that she feels better.  Diuretic therapy have been escalated because of peripheral edema and dyspnea.  This likely cause some acute kidney injury on CKD III.  She is happy with how she is feeling.  She denies orthopnea and PND.  Spironolactone has been discontinued.  She denies angina.  She has not had bleeding.  She does have bruising on Eliquis.  Past Medical History:  Diagnosis Date  . Allergic rhinitis due to pollen   . Atrial fibrillation ([place]) 12/23/2011  . Atrial fibrillation with rapid ventricular response ([place])   . Chronic diastolic HF (heart failure) ([place]) 11/21/2012  . CKD (chronic kidney disease) stage 3, GFR 30-59 ml/min 04/19/2019  . Congestive heart failure, unspecified    patient states she is not aware of this  . Deep venous thrombosis of lower extremity ([place]) 06/02/2012  . Deviated septum 01/11/2020  . Dizziness   . DVT (deep venous thrombosis) ([facility]) left leg  . Epistaxis 01/11/2020  . Essential hypertension, malignant   . GERD (gastroesophageal reflux disease)   . Heart murmur   . Insulin resistance    History of insulin resistance.  . Insulin resistance   . Long term current  use of anticoagulant therapy 02/08/2014   coumadin   . Obesity   . Pure hypercholesterolemia   . TIA (transient ischemic attack) 10/02/2018  . Vertigo     Past Surgical History:  Procedure Laterality Date  . APPENDECTOMY    . BREAST BIOPSY    . CHOLECYSTECTOMY    . PARTIAL HYSTERECTOMY      Current Medications: Current Meds  Medication Sig  . amLODipine (NORVASC) 5 MG tablet Take 1 tablet (5 mg total) by mouth daily.  Marland Kitchen atenolol (TENORMIN) 50 MG tablet Take 1 tablet by mouth once daily  . ELIQUIS 2.5 MG TABS tablet Take 1 tablet by mouth 2 (two) times daily.  Jasmine [name] 25 MCG tablet Take 25 mcg by mouth daily.  . furosemide (LASIX) 40 MG tablet Take 40 mg by mouth 3 (three) times a week.  . irbesartan (AVAPRO) 75 MG tablet Take 1 tablet (75 mg total) by mouth daily.  . meclizine (ANTIVERT) 25 MG tablet Take 12.5 mg by mouth 3 (three) times daily as needed for dizziness.   . sodium chloride (OCEAN) 0.65 % SOLN nasal spray Place 1 spray into both nostrils as needed for congestion.     Allergies:   Codeine, Lovenox [enoxaparin sodium], Norvasc [amlodipine besylate], Sulfa antibiotics, and Latex   Social History   Socioeconomic History  . Marital status: Single    Spouse name: Not on file  .  Number of children: Not on file  . Years of education: Not on file  . Highest education level: Not on file  Occupational History  . Not on file  Tobacco Use  . Smoking status: Never Smoker  . Smokeless tobacco: Never Used  Substance and Sexual Activity  . Alcohol use: No  . Drug use: No  . Sexual activity: Not Currently    Birth control/protection: Post-menopausal  Other Topics Concern  . Not on file  Social History Narrative  . Not on file   Social Determinants of Health   Financial Resource Strain:   . Difficulty of Paying Living Expenses: Not on file  Food Insecurity:   . Worried About Charity fundraiser in the Last Year: Not on file  . Ran Out of Food in the Last Year:  Not on file  Transportation Needs:   . Lack of Transportation (Medical): Not on file  . Lack of Transportation (Non-Medical): Not on file  Physical Activity:   . Days of Exercise per Week: Not on file  . Minutes of Exercise per Session: Not on file  Stress:   . Feeling of Stress : Not on file  Social Connections:   . Frequency of Communication with Friends and Family: Not on file  . Frequency of Social Gatherings with Friends and Family: Not on file  . Attends Religious Services: Not on file  . Active Member of Clubs or Organizations: Not on file  . Attends Archivist Meetings: Not on file  . Marital Status: Not on file     Family History: The patient's family history includes Diabetes in her mother; Hypertension in her sister; Pulmonary embolism in her sister.  ROS:   Please see the history of present illness.    She denies blood in her urine and stool.  She is aware that she needs a low-sodium diet.  She does not always follow this.  I explained the correlation between edema and elevated blood pressure relative to salt intake.  She understands that her target is less than 2.3 g of sodium daily.  All other systems reviewed and are negative.  EKGs/Labs/Other Studies Reviewed:    The following studies were reviewed today: No functional data or imaging since her   2D echocardiogram October 2019: Study Conclusions   - Left ventricle: The cavity size was normal. Wall thickness was  increased in a pattern of mild LVH. There was focal basal  hypertrophy. Systolic function was normal. The estimated ejection  fraction was in the range of 50% to 55%. Wall motion was normal;  there were no regional wall motion abnormalities.  - Mitral valve: There was mild to moderate regurgitation.  - Left atrium: The atrium was severely dilated.  - Right atrium: The atrium was mildly dilated.  - Tricuspid valve: There was moderate regurgitation.  - Pulmonary arteries: Systolic  pressure was moderately increased.  PA peak pressure: 50 mm Hg (S).   EKG:  EKG atrial fibrillation, controlled rate, right axis deviation, prominent voltage, occasional PVC or Ashman's phenomenon.  Recent Labs: 05/02/2020: TSH 3.440 07/05/2020: Hemoglobin 10.8; NT-Pro BNP 3,046; Platelets 134 08/21/2020: BUN 63; Creatinine, Ser 2.22; Potassium 4.5; Sodium 134  Recent Lipid Panel    Component Value Date/Time   CHOL 95 10/03/2018 0521   TRIG 45 10/03/2018 0521   HDL 26 (L) 10/03/2018 0521   CHOLHDL 3.7 10/03/2018 0521   VLDL 9 10/03/2018 0521   LDLCALC 60 10/03/2018 0521    Physical  Exam:    VS:  BP (!) 148/72   Pulse 83   Ht 5\' 4"  (1.626 m)   Wt 147 lb 3.2 oz (66.8 kg)   SpO2 99%   BMI 25.27 kg/m     Wt Readings from Last 3 Encounters:  09/07/20 147 lb 3.2 oz (66.8 kg)  07/05/20 165 lb (74.8 kg)  05/02/20 147 lb (66.7 kg)     GEN: She is accompanied by her sister.  They are dressed identically. No acute distress HEENT: Normal NECK: No JVD. LYMPHATICS: No lymphadenopathy CARDIAC: Irregularly irregular RR without murmur, gallop, or edema. VASCULAR:  Normal Pulses. No bruits. RESPIRATORY:  Clear to auscultation without rales, wheezing or rhonchi  ABDOMEN: Soft, non-tender, non-distended, No pulsatile mass, MUSCULOSKELETAL: No deformity  SKIN: Warm and dry NEUROLOGIC:  Alert and oriented x 3 PSYCHIATRIC:  Normal affect   ASSESSMENT:    1. Chronic diastolic HF (heart failure) ([facility])   2. Stage 3a chronic kidney disease ([place])   3. Long term current use of anticoagulant therapy   4. Permanent atrial fibrillation ([place])   5. Essential hypertension   6. Chronic deep vein thrombosis (DVT) of distal vein of left lower extremity ([place])   7. Educated about COVID-19 virus infection    PLAN:    In order of problems listed above:  1. She appears to be euvolemic on current exam.  Spironolactone has been discontinued.  Spironolactone was being used to prevent endorgan  damage/fibrosis associated with atrial fibrillation and kidney disease.  This combined with loop diuretic therapy unacceptably elevated the creatinine. 2. She is in the CKD stage III to CKD stage IV category and being followed by Dr. Harrie [name].  I will follow her lead and not make any additions or changes to her regimen without coordination.  I do wonder if the patient could be helped by adding an SGLT2 since there is both kidney and renal protection with these agents. 3. Continue apixaban and notify us of bleeding.  Hemoglobin and the med needs to be done twice per year. 4. Atrial fibrillation has controlled rate.  Blood pressure control is not at goal and should be less than 140/80.  We will continue to track.  Any up titration of antihypertensive therapy will be coordinated with Dr. Royce [name]. 5. Not discussed 6. She decided to get mRNA vaccination, Pfizer.  We had a long conversation at the last visit.  I congratulated them on joining the ranks of the vaccinated who have decreased risk of poor outcome from COVID-19 disease.   Plan clinical follow-up with me in 4 to 6 months.   Medication Adjustments/Labs and Tests Ordered: Current medicines are reviewed at length with the patient today.  Concerns regarding medicines are outlined above.  Orders Placed This Encounter  Procedures  . EKG 12-Lead   No orders of the defined types were placed in this encounter.   Patient Instructions  Medication Instructions:  Your physician recommends that you continue on your current medications as directed. Please refer to the Current Medication list given to you today.  *If you need a refill on your cardiac medications before your next appointment, please call your pharmacy*   Lab Work: None If you have labs (blood work) drawn today and your tests are completely normal, you will receive your results only by: Marland Kitchen MyChart Message (if you have MyChart) OR . A paper copy in the mail If you have any lab test  that is abnormal or we need to change  your treatment, we will call you to review the results.   Testing/Procedures: None   Follow-Up: At [facility], you and your health needs are our priority.  As part of our continuing mission to provide you with exceptional heart care, we have created designated Provider Care Teams.  These Care Teams include your primary Cardiologist (physician) and Advanced Practice Providers (APPs -  Physician Assistants and Nurse Practitioners) who all work together to provide you with the care you need, when you need it.  We recommend signing up for the patient portal called "MyChart".  Sign up information is provided on this After Visit Summary.  MyChart is used to connect with patients for Virtual Visits (Telemedicine).  Patients are able to view lab/test results, encounter notes, upcoming appointments, etc.  Non-urgent messages can be sent to your provider as well.   To learn more about what you can do with MyChart, go to [email or web address].    Your next appointment:   6 month(s)  The format for your next appointment:   In Person  Provider:   You may see Jasmine Grooms, MD or one of the following Advanced Practice Providers on your designated Care Team:    Truitt Merle, NP  Cecilie Kicks, NP  Kathyrn Drown, NP    Other Instructions      Signed, Jasmine Grooms, MD  09/07/2020 12:51 PM    [place]

## 2020-09-07 ENCOUNTER — Other Ambulatory Visit: Payer: Self-pay

## 2020-09-07 ENCOUNTER — Encounter: Payer: Self-pay | Admitting: Interventional Cardiology

## 2020-09-07 ENCOUNTER — Ambulatory Visit (INDEPENDENT_AMBULATORY_CARE_PROVIDER_SITE_OTHER): Payer: Medicare Other | Admitting: Interventional Cardiology

## 2020-09-07 VITALS — BP 148/72 | HR 83 | Ht 64.0 in | Wt 147.2 lb

## 2020-09-07 DIAGNOSIS — Z7901 Long term (current) use of anticoagulants: Secondary | ICD-10-CM | POA: Diagnosis not present

## 2020-09-07 DIAGNOSIS — I825Z2 Chronic embolism and thrombosis of unspecified deep veins of left distal lower extremity: Secondary | ICD-10-CM

## 2020-09-07 DIAGNOSIS — N1831 Chronic kidney disease, stage 3a: Secondary | ICD-10-CM | POA: Diagnosis not present

## 2020-09-07 DIAGNOSIS — I5032 Chronic diastolic (congestive) heart failure: Secondary | ICD-10-CM

## 2020-09-07 DIAGNOSIS — I10 Essential (primary) hypertension: Secondary | ICD-10-CM

## 2020-09-07 DIAGNOSIS — I4821 Permanent atrial fibrillation: Secondary | ICD-10-CM

## 2020-09-07 DIAGNOSIS — Z7189 Other specified counseling: Secondary | ICD-10-CM

## 2020-09-07 NOTE — Patient Instructions (Signed)

## 2020-10-02 ENCOUNTER — Ambulatory Visit: Payer: Medicare Other | Admitting: Podiatry

## 2020-10-02 ENCOUNTER — Other Ambulatory Visit: Payer: Self-pay

## 2020-10-02 ENCOUNTER — Encounter: Payer: Self-pay | Admitting: Podiatry

## 2020-10-02 DIAGNOSIS — R600 Localized edema: Secondary | ICD-10-CM

## 2020-10-02 DIAGNOSIS — M79675 Pain in left toe(s): Secondary | ICD-10-CM | POA: Diagnosis not present

## 2020-10-02 DIAGNOSIS — M79674 Pain in right toe(s): Secondary | ICD-10-CM

## 2020-10-02 DIAGNOSIS — B351 Tinea unguium: Secondary | ICD-10-CM | POA: Diagnosis not present

## 2020-10-02 DIAGNOSIS — N1832 Chronic kidney disease, stage 3b: Secondary | ICD-10-CM

## 2020-10-02 DIAGNOSIS — I739 Peripheral vascular disease, unspecified: Secondary | ICD-10-CM

## 2020-10-07 NOTE — Progress Notes (Signed)
Subjective: Jasmine [name] presents today for follow up of at risk foot care. Patient has h/o CKD stage 3 and painful mycotic nails b/l that are difficult to trim. Pain interferes with ambulation. Aggravating factors include wearing enclosed shoe gear. Pain is relieved with periodic professional debridement.   She voices no new pedal problems on today's visit.  She remains on blood thinner, Eliquis.  Her twin sister, [name],  is present during today's visit.  Her PCP is Dr. Kevan Ny.  Allergies  Allergen Reactions  . Codeine   . Lovenox [Enoxaparin Sodium]   . Norvasc [Amlodipine Besylate]   . Sulfa Antibiotics   . Latex Rash     Objective: There were no vitals filed for this visit.  Pt is a pleasant 86 y.o. year old African American female  in NAD. AAO x 3.   Vascular Examination:  Capillary refill time to digits immediate b/l. Palpable pedal pulses b/l LE. Pedal hair absent. Lower extremity skin temperature gradient within normal limits. Dependent rubor noted b/l lower extremities. No pain with calf compression b/l. Nonpitting edema noted b/l lower extremities. Evidence of chronic venous insufficiency b/l LE.  Dermatological Examination: Pedal skin with normal turgor, texture and tone bilaterally. No open wounds bilaterally. No interdigital macerations bilaterally. Toenails 1-5 b/l elongated, dystrophic, thickened, crumbly with subungual debris and tenderness to dorsal palpation.  Musculoskeletal: Normal muscle strength 5/5 to all lower extremity muscle groups bilaterally, no pain crepitus or joint limitation noted with ROM b/l and hammertoes noted to the  R 2nd toe.  Neurological: Protective sensation intact 5/5 intact bilaterally with 10g monofilament b/l. Vibratory sensation intact b/l. Proprioception intact bilaterally. Babinski reflex negative b/l. Clonus negative b/l.  Assessment: 1. Pain due to onychomycosis of toenails of both feet   2. Lower extremity edema   3.  Stage 3b chronic kidney disease ([place])   4. PAD (peripheral artery disease) ([place])    Plan: -Examined patient. -No new findings. No new orders. -Toenails 1-5 b/l were debrided in length and girth with sterile nail nippers and dremel without iatrogenic bleeding.  -Patient to report any pedal injuries to medical professional immediately. -Patient to continue soft, supportive shoe gear daily. -Patient/POA to call should there be question/concern in the interim.  Return in about 3 months (around 01/02/2021) for 3 month toenail debridement.

## 2020-11-14 ENCOUNTER — Other Ambulatory Visit: Payer: Self-pay | Admitting: Interventional Cardiology

## 2020-11-14 DIAGNOSIS — I10 Essential (primary) hypertension: Secondary | ICD-10-CM

## 2020-11-24 ENCOUNTER — Other Ambulatory Visit (HOSPITAL_COMMUNITY): Payer: Self-pay | Admitting: Internal Medicine

## 2020-11-24 ENCOUNTER — Other Ambulatory Visit: Payer: Self-pay

## 2020-11-24 ENCOUNTER — Ambulatory Visit (HOSPITAL_COMMUNITY)
Admission: RE | Admit: 2020-11-24 | Discharge: 2020-11-24 | Disposition: A | Discharge status: Home / Self Care | Payer: Medicare Other | Source: Ambulatory Visit | Attending: Internal Medicine | Admitting: Internal Medicine

## 2020-11-24 DIAGNOSIS — M7989 Other specified soft tissue disorders: Secondary | ICD-10-CM | POA: Insufficient documentation

## 2021-01-02 ENCOUNTER — Telehealth: Payer: Self-pay | Admitting: Interventional Cardiology

## 2021-01-02 NOTE — Telephone Encounter (Signed)
Patient c/o Palpitations:  High priority if patient c/o lightheadedness, shortness of breath, or chest pain  1) How long have you had palpitations/irregular HR/ Afib? Are you having the symptoms now? Yes, she's having Afib and palpitations.  2) Are you currently experiencing lightheadedness, SOB or CP? No.  3) Do you have a history of afib (atrial fibrillation) or irregular heart rhythm? Yes.  4) Have you checked your BP or HR? (document readings if available): 01/02/2021 166/59   165/60 147/79  5) Are you experiencing any other symptoms? No.  Patient states that she feels like her heart is "jumping in her chest" she has a recall to see Dr. Tamala Julian in March but he has no availability until may and she would like to be seen sooner. Please advise.

## 2021-01-02 NOTE — Telephone Encounter (Signed)
Spoke with the patient who states that she has been having bothersome palpitations on and off. She feels like her heart is jumping in her chest. She reports that her heart rate has been in the 60s-70s. She denies any SOB, dizziness, or SOB.  She states that another one of her providers increased her atenolol to 100 mg daily. She states she is also taking her Eliquis as prescribed.  She is scheduled for her 4-6 month FU with Dr. Tamala Julian on 2/2.

## 2021-01-08 NOTE — Progress Notes (Signed)
Cardiology Office Note:    Date:  01/10/2021   ID:  Jasmine [name], DOB 1934-11-19, MRN [id number]  PCP:  Jasmine Blocker, MD  Cardiologist:  Jasmine Grooms, MD   Referring MD: Jasmine Blocker, MD   Chief Complaint  Patient presents with  . Congestive Heart Failure  . Atrial Fibrillation    History of Present Illness:    Jasmine [name] is a 87 y.o. female with a hx of chronicatrial fibrillation,CHADSVASC>2,diastolic heart failure,CKD 4, TIA,and hypertension.Twin sister has PAF and CDHF.  [name] seems to get somewhat aggravated when we talked about her various physicians.. She is not short of breath. Recent change in medication includes increasing atenolol to 100 mg/day. She denies orthopnea, PND, and chest pain. She has right greater than left lower extremity edema. She brings in laboratory data done recently by Dr. Marlou [name]. Creatinine increased from 1.66-1.80. Her appetite is good. She is careful with salt in her diet. She denies angina. She has not had syncope.  Past Medical History:  Diagnosis Date  . Allergic rhinitis due to pollen   . Atrial fibrillation ([place]) 12/23/2011  . Atrial fibrillation with rapid ventricular response ([place])   . Chronic diastolic HF (heart failure) ([place]) 11/21/2012  . CKD (chronic kidney disease) stage 3, GFR 30-59 ml/min ([place]) 04/19/2019  . Congestive heart failure, unspecified    patient states she is not aware of this  . Deep venous thrombosis of lower extremity ([place]) 06/02/2012  . Deviated septum 01/11/2020  . Dizziness   . DVT (deep venous thrombosis) (HCC) left leg  . Epistaxis 01/11/2020  . Essential hypertension, malignant   . GERD (gastroesophageal reflux disease)   . Heart murmur   . Insulin resistance    History of insulin resistance.  . Insulin resistance   . Long term current use of anticoagulant therapy 02/08/2014   coumadin   . Obesity   . Pure hypercholesterolemia   . TIA (transient ischemic attack) 10/02/2018  . Vertigo      Past Surgical History:  Procedure Laterality Date  . APPENDECTOMY    . BREAST BIOPSY    . CHOLECYSTECTOMY    . PARTIAL HYSTERECTOMY      Current Medications: Current Meds  Medication Sig  . amLODipine (NORVASC) 5 MG tablet Take 1 tablet (5 mg total) by mouth daily.  Marland Kitchen atenolol (TENORMIN) 100 MG tablet Take 100 mg by mouth daily.  Marland Kitchen ELIQUIS 2.5 MG TABS tablet Take 1 tablet by mouth 2 (two) times daily.  Jasmine [name] 25 MCG tablet Take 25 mcg by mouth daily.  . furosemide (LASIX) 40 MG tablet Take 40 mg by mouth daily.  . irbesartan (AVAPRO) 75 MG tablet Take 1 tablet by mouth once daily     Allergies:   Codeine, Lovenox [enoxaparin sodium], Norvasc [amlodipine besylate], Sulfa antibiotics, and Latex   Social History   Socioeconomic History  . Marital status: Single    Spouse name: Not on file  . Number of children: Not on file  . Years of education: Not on file  . Highest education level: Not on file  Occupational History  . Not on file  Tobacco Use  . Smoking status: Never Smoker  . Smokeless tobacco: Never Used  Substance and Sexual Activity  . Alcohol use: No  . Drug use: No  . Sexual activity: Not Currently    Birth control/protection: Post-menopausal  Other Topics Concern  . Not on file  Social History Narrative  . Not  on file   Social Determinants of Health   Financial Resource Strain: Not on file  Food Insecurity: Not on file  Transportation Needs: Not on file  Physical Activity: Not on file  Stress: Not on file  Social Connections: Not on file     Family History: The patient's family history includes Diabetes in her mother; Hypertension in her sister; Pulmonary embolism in her sister.  ROS:   Please see the history of present illness.    Measures her blood pressure at home. Tends to run less than 140/80 mmHg. We discussed monitoring salt intake. All other systems reviewed and are negative.  EKGs/Labs/Other Studies Reviewed:    The following  studies were reviewed today:\  Creatinine is 1.8 with an estimated GFR of 25, potassium is 5.2. Laboratory data was obtained on 01/02/2021. Doppler echocardiogram 2019: Study Conclusions   - Left ventricle: The cavity size was normal. Wall thickness was  increased in a pattern of mild LVH. There was focal basal  hypertrophy. Systolic function was normal. The estimated ejection  fraction was in the range of 50% to 55%. Wall motion was normal;  there were no regional wall motion abnormalities.  - Mitral valve: There was mild to moderate regurgitation.  - Left atrium: The atrium was severely dilated.  - Right atrium: The atrium was mildly dilated.  - Tricuspid valve: There was moderate regurgitation.  - Pulmonary arteries: Systolic pressure was moderately increased.  PA peak pressure: 50 mm Hg (S).   EKG:  EKG not repeated  Recent Labs: 05/02/2020: TSH 3.440 07/05/2020: Hemoglobin 10.8; NT-Pro BNP 3,046; Platelets 134 08/21/2020: BUN 63; Creatinine, Ser 2.22; Potassium 4.5; Sodium 134  Recent Lipid Panel    Component Value Date/Time   CHOL 95 10/03/2018 0521   TRIG 45 10/03/2018 0521   HDL 26 (L) 10/03/2018 0521   CHOLHDL 3.7 10/03/2018 0521   VLDL 9 10/03/2018 0521   LDLCALC 60 10/03/2018 0521    Physical Exam:    VS:  BP (!) 152/66   Pulse (!) 47   Ht '5\' 4"'$  (1.626 m)   Wt 152 lb 9.6 oz (69.2 kg)   SpO2 100%   BMI 26.19 kg/m     Wt Readings from Last 3 Encounters:  01/10/21 152 lb 9.6 oz (69.2 kg)  09/07/20 147 lb 3.2 oz (66.8 kg)  07/05/20 165 lb (74.8 kg)     GEN: Somewhat pale in appearance. No acute distress HEENT: Normal NECK: Marked V wave with elevated JVD. LYMPHATICS: No lymphadenopathy CARDIAC: Left lower parasternal holosystolic murmur. Regularly irregular RR without gallop, or edema. Left parasternal heave consistent with right ventricular hypertrophy or pressure overload VASCULAR:  Normal Pulses. No bruits. RESPIRATORY:  Clear to auscultation  without rales, wheezing or rhonchi  ABDOMEN: Soft, non-tender, non-distended, No pulsatile mass, MUSCULOSKELETAL: No deformity  SKIN: Warm and dry NEUROLOGIC:  Alert and oriented x 3 PSYCHIATRIC:  Normal affect   ASSESSMENT:    1. Chronic diastolic HF (heart failure) (HCC)   2. Stage 3a chronic kidney disease (HCC)   3. Permanent atrial fibrillation ([place])   4. Long term current use of anticoagulant therapy   5. Essential hypertension   6. Chronic deep vein thrombosis (DVT) of distal vein of left lower extremity ([place])   7. Educated about COVID-19 virus infection    PLAN:    In order of problems listed above:  1. Has volume overload with elevated neck veins there is evidence on exam of right heart dysfunction. 2D  Doppler echocardiogram will be done to reassess pulmonary hypertension, RV size and function, and LV function in comparison to 2019. 2. Stable stage IIIb CKD. 3. Chronic with good rate control on atenolol 100 mg/day. 4. Continue lower dose Eliquis 2.5 mg/day for protection against embolic stroke. 5. Blood pressure needs to be followed closely. Systolic is elevated today. This will be monitored. Low-salt diet, less than 2.4 g/day is recommended. 6. Had lower extremity Doppler studies performed. Results are not known. 7. Status post vaccination, boosting, and practicing social medication.   Medication Adjustments/Labs and Tests Ordered: Current medicines are reviewed at length with the patient today.  Concerns regarding medicines are outlined above.  No orders of the defined types were placed in this encounter.  No orders of the defined types were placed in this encounter.   There are no Patient Instructions on file for this visit.   Signed, Jasmine Grooms, MD  01/10/2021 3:12 PM    [place]

## 2021-01-09 ENCOUNTER — Other Ambulatory Visit: Payer: Self-pay

## 2021-01-09 ENCOUNTER — Ambulatory Visit: Payer: Medicare Other | Admitting: Podiatry

## 2021-01-09 ENCOUNTER — Encounter: Payer: Self-pay | Admitting: Podiatry

## 2021-01-09 DIAGNOSIS — N1832 Chronic kidney disease, stage 3b: Secondary | ICD-10-CM

## 2021-01-09 DIAGNOSIS — B351 Tinea unguium: Secondary | ICD-10-CM | POA: Diagnosis not present

## 2021-01-09 DIAGNOSIS — M79674 Pain in right toe(s): Secondary | ICD-10-CM

## 2021-01-09 DIAGNOSIS — R600 Localized edema: Secondary | ICD-10-CM

## 2021-01-09 DIAGNOSIS — Z9229 Personal history of other drug therapy: Secondary | ICD-10-CM

## 2021-01-09 DIAGNOSIS — M79675 Pain in left toe(s): Secondary | ICD-10-CM | POA: Diagnosis not present

## 2021-01-09 NOTE — Progress Notes (Signed)
Subjective: Jasmine [name] presents today for follow up of at risk foot care. Patient has h/o CKD stage 3 and painful mycotic nails b/l that are difficult to trim. Pain interferes with ambulation. Aggravating factors include wearing enclosed shoe gear. Pain is relieved with periodic professional debridement.   She states she will be having lower extremity studies this month. She is followed by Dr. Tamala Julian for Cardiology.  She remains on blood thinner, Eliquis.  Her twin sister, [name],  is present during today's visit.  Her PCP is Dr. Kevan Ny.  Allergies  Allergen Reactions  . Codeine   . Lovenox [Enoxaparin Sodium]   . Norvasc [Amlodipine Besylate]   . Sulfa Antibiotics   . Latex Rash     Objective: There were no vitals filed for this visit.  Pt is a pleasant 87 y.o. year old African American female  in NAD. AAO x 3.   Vascular Examination:  Capillary refill time to digits immediate b/l. Palpable pedal pulses b/l LE. Pedal hair absent. Lower extremity skin temperature gradient within normal limits. Dependent rubor noted b/l lower extremities. No pain with calf compression b/l. Trace edema noted left lower extremity. +1 pitting edema right lower extremity. Evidence of chronic venous insufficiency b/l lower extremities.  Dermatological Examination: Pedal skin with normal turgor, texture and tone bilaterally. No open wounds bilaterally. No interdigital macerations bilaterally. Toenails 1-5 b/l elongated, dystrophic, thickened, crumbly with subungual debris and tenderness to dorsal palpation.  Musculoskeletal: Normal muscle strength 5/5 to all lower extremity muscle groups bilaterally, no pain crepitus or joint limitation noted with ROM b/l and hammertoes noted to the  R 2nd toe.  Neurological: Protective sensation intact 5/5 intact bilaterally with 10g monofilament b/l. Vibratory sensation intact b/l. Proprioception intact bilaterally. Babinski reflex negative b/l. Clonus negative  b/l.  Assessment: 1. Pain due to onychomycosis of toenails of both feet   2. Lower extremity edema   3. Stage 3b chronic kidney disease ([place])   4. Hx of long term use of blood thinners    Plan: -Examined patient. -Patient to continue soft, supportive shoe gear daily. -Toenails 1-5 b/l were debrided in length and girth with sterile nail nippers and dremel without iatrogenic bleeding.  -Patient to report any pedal injuries to medical professional immediately. -Patient/POA to call should there be question/concern in the interim.  Return in about 3 months (around 04/08/2021).

## 2021-01-10 ENCOUNTER — Encounter: Payer: Self-pay | Admitting: Interventional Cardiology

## 2021-01-10 ENCOUNTER — Ambulatory Visit: Payer: Medicare Other | Admitting: Interventional Cardiology

## 2021-01-10 VITALS — BP 152/66 | HR 47 | Ht 64.0 in | Wt 152.6 lb

## 2021-01-10 DIAGNOSIS — Z7189 Other specified counseling: Secondary | ICD-10-CM

## 2021-01-10 DIAGNOSIS — Z7901 Long term (current) use of anticoagulants: Secondary | ICD-10-CM | POA: Diagnosis not present

## 2021-01-10 DIAGNOSIS — I272 Pulmonary hypertension, unspecified: Secondary | ICD-10-CM

## 2021-01-10 DIAGNOSIS — I4821 Permanent atrial fibrillation: Secondary | ICD-10-CM

## 2021-01-10 DIAGNOSIS — I10 Essential (primary) hypertension: Secondary | ICD-10-CM

## 2021-01-10 DIAGNOSIS — I5032 Chronic diastolic (congestive) heart failure: Secondary | ICD-10-CM | POA: Diagnosis not present

## 2021-01-10 DIAGNOSIS — I825Z2 Chronic embolism and thrombosis of unspecified deep veins of left distal lower extremity: Secondary | ICD-10-CM

## 2021-01-10 DIAGNOSIS — N1831 Chronic kidney disease, stage 3a: Secondary | ICD-10-CM

## 2021-01-10 NOTE — Patient Instructions (Addendum)
Medication Instructions:  Your physician recommends that you continue on your current medications as directed. Please refer to the Current Medication list given to you today.  *If you need a refill on your cardiac medications before your next appointment, please call your pharmacy*   Lab Work: None If you have labs (blood work) drawn today and your tests are completely normal, you will receive your results only by: Marland Kitchen MyChart Message (if you have MyChart) OR . A paper copy in the mail If you have any lab test that is abnormal or we need to change your treatment, we will call you to review the results.   Testing/Procedures: Your physician has requested that you have an echocardiogram. Echocardiography is a painless test that uses sound waves to create images of your heart. It provides your doctor with information about the size and shape of your heart and how well your heart's chambers and valves are working. This procedure takes approximately one hour. There are no restrictions for this procedure.   Follow-Up: At [facility], you and your health needs are our priority.  As part of our continuing mission to provide you with exceptional heart care, we have created designated Provider Care Teams.  These Care Teams include your primary Cardiologist (physician) and Advanced Practice Providers (APPs -  Physician Assistants and Nurse Practitioners) who all work together to provide you with the care you need, when you need it.  We recommend signing up for the patient portal called "MyChart".  Sign up information is provided on this After Visit Summary.  MyChart is used to connect with patients for Virtual Visits (Telemedicine).  Patients are able to view lab/test results, encounter notes, upcoming appointments, etc.  Non-urgent messages can be sent to your provider as well.   To learn more about what you can do with MyChart, go to [email or web address].    Your next appointment:   3-4  month(s)  The format for your next appointment:   In Person  Provider:   You may see Sinclair Grooms, MD or one of the following Advanced Practice Providers on your designated Care Team:    Kathyrn Drown, NP    Other Instructions

## 2021-01-11 ENCOUNTER — Ambulatory Visit (INDEPENDENT_AMBULATORY_CARE_PROVIDER_SITE_OTHER)
Admission: RE | Admit: 2021-01-11 | Discharge: 2021-01-11 | Disposition: A | Discharge status: Home / Self Care | Payer: Medicare Other | Source: Ambulatory Visit | Attending: Internal Medicine | Admitting: Internal Medicine

## 2021-01-11 ENCOUNTER — Other Ambulatory Visit (HOSPITAL_COMMUNITY): Payer: Self-pay | Admitting: Internal Medicine

## 2021-01-11 ENCOUNTER — Other Ambulatory Visit: Payer: Self-pay

## 2021-01-11 ENCOUNTER — Ambulatory Visit (HOSPITAL_COMMUNITY)
Admission: RE | Admit: 2021-01-11 | Discharge: 2021-01-11 | Disposition: A | Discharge status: Home / Self Care | Payer: Medicare Other | Source: Ambulatory Visit | Attending: Internal Medicine | Admitting: Internal Medicine

## 2021-01-11 DIAGNOSIS — I739 Peripheral vascular disease, unspecified: Secondary | ICD-10-CM

## 2021-01-11 DIAGNOSIS — R600 Localized edema: Secondary | ICD-10-CM

## 2021-01-26 ENCOUNTER — Other Ambulatory Visit: Payer: Self-pay | Admitting: Internal Medicine

## 2021-01-26 ENCOUNTER — Ambulatory Visit
Admission: RE | Admit: 2021-01-26 | Discharge: 2021-01-26 | Disposition: A | Discharge status: Home / Self Care | Payer: Medicare Other | Source: Ambulatory Visit | Attending: Internal Medicine | Admitting: Internal Medicine

## 2021-01-26 DIAGNOSIS — R0789 Other chest pain: Secondary | ICD-10-CM

## 2021-01-26 DIAGNOSIS — R0989 Other specified symptoms and signs involving the circulatory and respiratory systems: Secondary | ICD-10-CM

## 2021-01-30 ENCOUNTER — Other Ambulatory Visit: Payer: Self-pay

## 2021-01-30 ENCOUNTER — Ambulatory Visit (HOSPITAL_COMMUNITY): Payer: Medicare Other | Attending: Internal Medicine

## 2021-01-30 DIAGNOSIS — I272 Pulmonary hypertension, unspecified: Secondary | ICD-10-CM | POA: Diagnosis present

## 2021-01-30 LAB — ECHOCARDIOGRAM COMPLETE
AR max vel: 1.19 cm2
AV Area VTI: 0.99 cm2
AV Area mean vel: 1.19 cm2
AV Mean grad: 4.5 mmHg
AV Peak grad: 9.7 mmHg
Ao pk vel: 1.56 m/s
Area-P 1/2: 5.04 cm2
MV M vel: 5.66 m/s
MV Peak grad: 128.1 mmHg
S' Lateral: 2.2 cm

## 2021-02-20 ENCOUNTER — Emergency Department (HOSPITAL_COMMUNITY): Payer: Medicare Other

## 2021-02-20 ENCOUNTER — Other Ambulatory Visit: Payer: Self-pay

## 2021-02-20 ENCOUNTER — Emergency Department (HOSPITAL_COMMUNITY)
Admission: EM | Admit: 2021-02-20 | Discharge: 2021-02-20 | Disposition: A | Discharge status: Home / Self Care | Payer: Medicare Other | Attending: Emergency Medicine | Admitting: Emergency Medicine

## 2021-02-20 ENCOUNTER — Encounter (HOSPITAL_COMMUNITY): Payer: Self-pay | Admitting: Emergency Medicine

## 2021-02-20 DIAGNOSIS — Z91040 Latex allergy status: Secondary | ICD-10-CM | POA: Diagnosis not present

## 2021-02-20 DIAGNOSIS — I5032 Chronic diastolic (congestive) heart failure: Secondary | ICD-10-CM | POA: Insufficient documentation

## 2021-02-20 DIAGNOSIS — I130 Hypertensive heart and chronic kidney disease with heart failure and stage 1 through stage 4 chronic kidney disease, or unspecified chronic kidney disease: Secondary | ICD-10-CM | POA: Diagnosis not present

## 2021-02-20 DIAGNOSIS — Z7901 Long term (current) use of anticoagulants: Secondary | ICD-10-CM | POA: Diagnosis not present

## 2021-02-20 DIAGNOSIS — R0600 Dyspnea, unspecified: Secondary | ICD-10-CM

## 2021-02-20 DIAGNOSIS — N183 Chronic kidney disease, stage 3 unspecified: Secondary | ICD-10-CM | POA: Insufficient documentation

## 2021-02-20 DIAGNOSIS — Z79899 Other long term (current) drug therapy: Secondary | ICD-10-CM | POA: Insufficient documentation

## 2021-02-20 DIAGNOSIS — R0602 Shortness of breath: Secondary | ICD-10-CM | POA: Diagnosis present

## 2021-02-20 DIAGNOSIS — I509 Heart failure, unspecified: Secondary | ICD-10-CM

## 2021-02-20 LAB — BASIC METABOLIC PANEL
Anion gap: 10 (ref 5–15)
BUN: 44 mg/dL — ABNORMAL HIGH (ref 8–23)
CO2: 19 mmol/L — ABNORMAL LOW (ref 22–32)
Calcium: 9.6 mg/dL (ref 8.9–10.3)
Chloride: 102 mmol/L (ref 98–111)
Creatinine, Ser: 1.48 mg/dL — ABNORMAL HIGH (ref 0.44–1.00)
GFR, Estimated: 35 mL/min — ABNORMAL LOW (ref >60)
Glucose, Bld: 205 mg/dL — ABNORMAL HIGH (ref 70–99)
Potassium: 4.5 mmol/L (ref 3.5–5.1)
Sodium: 131 mmol/L — ABNORMAL LOW (ref 135–145)

## 2021-02-20 LAB — CBC WITH DIFFERENTIAL/PLATELET
Abs Immature Granulocytes: 0.16 10*3/uL — ABNORMAL HIGH (ref 0.00–0.07)
Basophils Absolute: 0 10*3/uL (ref 0.0–0.1)
Basophils Relative: 0 %
Eosinophils Absolute: 0 10*3/uL (ref 0.0–0.5)
Eosinophils Relative: 0 %
HCT: 32.4 % — ABNORMAL LOW (ref 36.0–46.0)
Hemoglobin: 10.4 g/dL — ABNORMAL LOW (ref 12.0–15.0)
Immature Granulocytes: 1 %
Lymphocytes Relative: 6 %
Lymphs Abs: 0.7 10*3/uL (ref 0.7–4.0)
MCH: 31.6 pg (ref 26.0–34.0)
MCHC: 32.1 g/dL (ref 30.0–36.0)
MCV: 98.5 fL (ref 80.0–100.0)
Monocytes Absolute: 1.1 10*3/uL — ABNORMAL HIGH (ref 0.1–1.0)
Monocytes Relative: 10 %
Neutro Abs: 9.1 10*3/uL — ABNORMAL HIGH (ref 1.7–7.7)
Neutrophils Relative %: 83 %
Platelets: 289 10*3/uL (ref 150–400)
RBC: 3.29 MIL/uL — ABNORMAL LOW (ref 3.87–5.11)
RDW: 14.6 % (ref 11.5–15.5)
WBC: 11.1 10*3/uL — ABNORMAL HIGH (ref 4.0–10.5)
nRBC: 0.2 % (ref 0.0–0.2)

## 2021-02-20 LAB — BRAIN NATRIURETIC PEPTIDE: B Natriuretic Peptide: 1037.1 pg/mL — ABNORMAL HIGH (ref 0.0–100.0)

## 2021-02-20 LAB — TROPONIN I (HIGH SENSITIVITY): Troponin I (High Sensitivity): 7 ng/L (ref <18)

## 2021-02-20 MED ORDER — FUROSEMIDE 10 MG/ML IJ SOLN
60.0000 mg | Freq: Once | INTRAMUSCULAR | Status: AC
  Administered 2021-02-20: 60 mg via INTRAVENOUS
  Filled 2021-02-20: qty 6

## 2021-02-20 NOTE — ED Notes (Signed)
Patient transported to CT 

## 2021-02-20 NOTE — ED Provider Notes (Signed)
[facility] EMERGENCY DEPARTMENT Provider Note   CSN: [id number] Arrival date & time: 02/20/21  1649     History Chief Complaint  Patient presents with  . Shortness of Breath    Jasmine [name] is a 87 y.o. female w/ hx of A Fib on eliquis, HTN, pulm HTN, CHF, CKD, presenting to ED with leg swelling and shortness of breath.  The patient reports that she feels she has become progressively more short of breath and having worsening lower extremity edema for the past 3 days.  She reports her doctor is recently been increasing her Lasix dose, from 40 mg 3 times a week to 40 mg daily.  She has been compliant with all of her medications.  However it is not helping with her symptoms.  She feels extremely short of breath and tried to move around the house, and states she is having a difficult time   HPI     Past Medical History:  Diagnosis Date  . Allergic rhinitis due to pollen   . Atrial fibrillation ([place]) 12/23/2011  . Atrial fibrillation with rapid ventricular response ([place])   . Chronic diastolic HF (heart failure) ([place]) 11/21/2012  . CKD (chronic kidney disease) stage 3, GFR 30-59 ml/min ([place]) 04/19/2019  . Congestive heart failure, unspecified    patient states she is not aware of this  . Deep venous thrombosis of lower extremity ([place]) 06/02/2012  . Deviated septum 01/11/2020  . Dizziness   . DVT (deep venous thrombosis) (H[facility]) left leg  . Epistaxis 01/11/2020  . Essential hypertension, malignant   . GERD (gastroesophageal reflux disease)   . Heart murmur   . Insulin resistance    History of insulin resistance.  . Insulin resistance   . Long term current use of anticoagulant therapy 02/08/2014   coumadin   . Obesity   . Pure hypercholesterolemia   . TIA (transient ischemic attack) 10/02/2018  . Vertigo     Patient Active Problem List   Diagnosis Date Noted  . Deviated septum 01/11/2020  . Epistaxis 01/11/2020  . CKD (chronic kidney disease) stage 3, GFR 30-59  ml/min (H[facility]) 04/19/2019  . TIA (transient ischemic attack) 10/02/2018  . Long term current use of anticoagulant therapy 02/08/2014  . Chronic diastolic HF (heart failure) ([place]) 11/21/2012  . Insulin resistance 11/21/2012  . Deep venous thrombosis of lower extremity ([place]) 06/02/2012  . Hypertension 01/07/2012  . Atrial fibrillation ([place]) 12/23/2011    Past Surgical History:  Procedure Laterality Date  . APPENDECTOMY    . BREAST BIOPSY    . CHOLECYSTECTOMY    . PARTIAL HYSTERECTOMY       OB History   No obstetric history on file.     Family History  Problem Relation Age of Onset  . Diabetes Mother   . Hypertension Sister   . Pulmonary embolism Sister     Social History   Tobacco Use  . Smoking status: Never Smoker  . Smokeless tobacco: Never Used  Substance Use Topics  . Alcohol use: No  . Drug use: No    Home Medications Prior to Admission medications   Medication Sig Start Date End Date Taking? Authorizing Provider  amLODipine (NORVASC) 5 MG tablet Take 1 tablet (5 mg total) by mouth daily. 07/05/20   Belva Crome, MD  atenolol (TENORMIN) 100 MG tablet Take 100 mg by mouth daily. 12/14/20   [provider]  ELIQUIS 2.5 MG TABS tablet Take 1 tablet by mouth 2 (  two) times daily. 08/17/19   [provider]  EUTHYROX 25 MCG tablet Take 25 mcg by mouth daily. 03/07/20   [provider]  furosemide (LASIX) 40 MG tablet Take 40 mg by mouth daily.    [provider]  irbesartan (AVAPRO) 75 MG tablet Take 1 tablet by mouth once daily 11/14/20   Belva Crome, MD    Allergies    Codeine, Lovenox [enoxaparin sodium], Norvasc [amlodipine besylate], Sulfa antibiotics, and Latex  Review of Systems   Review of Systems  Constitutional: Negative for chills and fever.  HENT: Negative for ear pain and sore throat.   Eyes: Negative for pain and visual disturbance.  Respiratory: Positive for shortness of breath. Negative for cough.    Cardiovascular: Positive for leg swelling. Negative for chest pain and palpitations.  Gastrointestinal: Negative for abdominal pain and vomiting.  Genitourinary: Negative for dysuria and hematuria.  Musculoskeletal: Negative for arthralgias and back pain.  Skin: Negative for color change and rash.  Neurological: Negative for syncope and light-headedness.  All other systems reviewed and are negative.   Physical Exam Updated Vital Signs BP 130/80 (BP Location: Right Arm)   Pulse 80   Temp 98 F (36.7 C) (Oral)   Resp 18   SpO2 96%   Physical Exam Constitutional:      General: She is not in acute distress. HENT:     Head: Normocephalic and atraumatic.  Eyes:     Conjunctiva/sclera: Conjunctivae normal.     Pupils: Pupils are equal, round, and reactive to light.  Cardiovascular:     Rate and Rhythm: Normal rate. Rhythm irregular.  Pulmonary:     Effort: Pulmonary effort is normal. No respiratory distress.  Abdominal:     General: There is no distension.     Tenderness: There is no abdominal tenderness.  Musculoskeletal:     Right lower leg: Edema present.     Left lower leg: Edema present.  Skin:    General: Skin is warm and dry.  Neurological:     General: No focal deficit present.     Mental Status: She is alert and oriented to person, place, and time. Mental status is at baseline.  Psychiatric:        Mood and Affect: Mood normal.        Behavior: Behavior normal.     ED Results / Procedures / Treatments   Labs (all labs ordered are listed, but only abnormal results are displayed) Labs Reviewed  BRAIN NATRIURETIC PEPTIDE - Abnormal; Notable for the following components:      Result Value   B Natriuretic Peptide 1,037.1 (*)    All other components within normal limits  BASIC METABOLIC PANEL - Abnormal; Notable for the following components:   Sodium 131 (*)    CO2 19 (*)    Glucose, Bld 205 (*)    BUN 44 (*)    Creatinine, Ser 1.48 (*)    GFR, Estimated 35  (*)    All other components within normal limits  CBC WITH DIFFERENTIAL/PLATELET - Abnormal; Notable for the following components:   WBC 11.1 (*)    RBC 3.29 (*)    Hemoglobin 10.4 (*)    HCT 32.4 (*)    Neutro Abs 9.1 (*)    Monocytes Absolute 1.1 (*)    Abs Immature Granulocytes 0.16 (*)    All other components within normal limits  TROPONIN I (HIGH SENSITIVITY)    EKG None  Radiology DG Chest  Portable 1 View  Result Date: 02/20/2021 CLINICAL DATA:  Shortness of breath. EXAM: PORTABLE CHEST 1 VIEW COMPARISON:  January 26, 2021 FINDINGS: Mild atelectasis and/or infiltrate is seen within the left lung base. There is a small left pleural effusion. No pneumothorax is identified. The cardiac silhouette is markedly enlarged. The visualized skeletal structures are unremarkable. IMPRESSION: 1. Stable cardiomegaly with mild left basilar atelectasis and/or infiltrate. 2. Small left pleural effusion. Electronically Signed   By: Virgina Norfolk M.D.   On: 02/20/2021 17:37    Procedures .Critical Care Performed by: Wyvonnia Dusky, MD Authorized by: Wyvonnia Dusky, MD   Critical care provider statement:    Critical care time (minutes):  45   Critical care was necessary to treat or prevent imminent or life-threatening deterioration of the following conditions:  Cardiac failure   Critical care was time spent personally by me on the following activities:  Discussions with consultants, evaluation of patient's response to treatment, examination of patient, ordering and performing treatments and interventions, ordering and review of laboratory studies, ordering and review of radiographic studies, pulse oximetry, re-evaluation of patient's condition, obtaining history from patient or surrogate and review of old charts Comments:     IV diuresis for CHF     Medications Ordered in ED Medications  furosemide (LASIX) injection 60 mg (60 mg Intravenous Given 02/20/21 1725)    ED Course  I  have reviewed the triage vital signs and the nursing notes.  Pertinent labs & imaging results that were available during my care of the patient were reviewed by me and considered in my medical decision making (see chart for details).   This patient complains of dyspnea, leg swelling.  This involves an extensive number of treatment options, and is a complaint that carries with it a high risk of complications and morbidity.  The differential diagnosis includes CHF exacerbation and/or Pulm HTN vs ACS vs anemia vs other  Most likely CHF related per her symptoms - IV diuresis needed for tachypnea.  She does not have new O2 demand while at rest, but we'll need to [employer].  Less likely PE with gradual onset of symptoms, patient compliant with A/C. No chest pain or pressure  I ordered, reviewed, and interpreted labs, which included trop 7 (with symptoms for several days, unlikely ACS), BNP 1037, hgb 10.4, WBC 11.1, BMP near baseline (Cr 1.48, better than past several checks).  I ordered medication IV lasix for diuresis I ordered imaging studies which included dg chest I independently visualized and interpreted imaging which showed small pleural effusion, likely atelectasis, and the monitor tracing which showed A Fib with HR 70-60 bpm. Previous records obtained and reviewed showing cardiology notes  ECG shows A Fib, HR 60-70, occasional PVCs - no acute ischemic findings    Clinical Course as of 02/21/21 0054  Tue Feb 20, 2021  2033 Pt prodeuced 400 cc urine.  Subjectively feeling less short of breath.  98% on room air at rest.  We'll perform an ambulatory pulse ox [MT]    Clinical Course User Index [MT] Bekka Qian, Carola Rhine, MD    Final Clinical Impression(s) / ED Diagnoses Final diagnoses:  Acute on chronic congestive heart failure, unspecified heart failure type ([place])  Dyspnea, unspecified type    Rx / DC Orders ED Discharge Orders    None       Wyvonnia Dusky, MD 02/21/21  [phone]

## 2021-02-20 NOTE — ED Triage Notes (Signed)
Pt BIB [facility] from home, c/o shortness of breath and BLE edema x 3 days. Shortness of breath worse with ambulation, 2LNC applied by EMS and pt reports improvement. Denies chest pain. EMS VS: BP 164/68, HR 80-130, hx afib

## 2021-02-20 NOTE — Discharge Instructions (Addendum)
Call Dr Thompson Caul office tomorrow to discuss your shortness of breath.  Your doctor may want to see you again in the office.  You have a significant history of heart problems that can be contributing to these symptoms.

## 2021-02-21 ENCOUNTER — Telehealth: Payer: Self-pay | Admitting: Interventional Cardiology

## 2021-02-21 NOTE — Telephone Encounter (Signed)
Pt c/o swelling: STAT is pt has developed SOB within 24 hours  1) How much weight have you gained and in what time span? Last week  2) If swelling, where is the swelling located? Both legs ankle and feet  3) Are you currently taking a fluid pill? no  4) Are you currently SOB? Yes   5) Do you have a log of your daily weights (if so, list)? No   6) Have you gained 3 pounds in a day or 5 pounds in a week? Yes   7) Have you traveled recently? No patient also went to the ER by EMS

## 2021-02-21 NOTE — Telephone Encounter (Signed)
Call transferred directly to triage. Spoke with the patient who states that she has had increased swelling in her lower extremities and worsenig shortness of breath. She went to the ED yesterday and was advised to follow up closely with cardiology.  She reports that today she is still having shortness of breath with exertion and legs are still swollen. She reports some improvement since being in the ED yesterday and receiving IV diureses, however still having trouble getting around her house due to swelling and SOB. She denies any chest pain. She verifies that she is taking her lasix 40 gm daily, elevating her legs, and avoiding salt in her diet. Recent blood pressure readings have been 144/79, 150/72, 147/88. Does not have any O2 readings Patient has been added to Dr. Thompson Caul schedule tomorrow 3/17.

## 2021-02-22 ENCOUNTER — Other Ambulatory Visit: Payer: Self-pay

## 2021-02-22 ENCOUNTER — Other Ambulatory Visit: Payer: Self-pay | Admitting: Interventional Cardiology

## 2021-02-22 ENCOUNTER — Encounter: Payer: Self-pay | Admitting: Interventional Cardiology

## 2021-02-22 ENCOUNTER — Ambulatory Visit (INDEPENDENT_AMBULATORY_CARE_PROVIDER_SITE_OTHER): Payer: Medicare Other | Admitting: Interventional Cardiology

## 2021-02-22 VITALS — BP 136/70 | HR 90 | Ht 64.0 in | Wt 148.0 lb

## 2021-02-22 DIAGNOSIS — I340 Nonrheumatic mitral (valve) insufficiency: Secondary | ICD-10-CM

## 2021-02-22 DIAGNOSIS — N1831 Chronic kidney disease, stage 3a: Secondary | ICD-10-CM

## 2021-02-22 DIAGNOSIS — I10 Essential (primary) hypertension: Secondary | ICD-10-CM

## 2021-02-22 DIAGNOSIS — I4821 Permanent atrial fibrillation: Secondary | ICD-10-CM | POA: Diagnosis not present

## 2021-02-22 DIAGNOSIS — Z7901 Long term (current) use of anticoagulants: Secondary | ICD-10-CM

## 2021-02-22 DIAGNOSIS — I5032 Chronic diastolic (congestive) heart failure: Secondary | ICD-10-CM | POA: Diagnosis not present

## 2021-02-22 DIAGNOSIS — R600 Localized edema: Secondary | ICD-10-CM

## 2021-02-22 MED ORDER — FUROSEMIDE 40 MG PO TABS: 60.0000 mg | ORAL_TABLET | Freq: Every day | ORAL | 3 refills | Status: DC

## 2021-02-22 NOTE — Progress Notes (Signed)
Cardiology Office Note:    Date:  02/22/2021   ID:  Jasmine [name], DOB 1934/06/08, MRN [id number]  PCP:  [name], MD  Cardiologist:  Sinclair Grooms, MD   Referring MD: [name], MD   Chief Complaint  Patient presents with  . Atrial Fibrillation  . Congestive Heart Failure    History of Present Illness:    Jasmine [name] is a 87 y.o. female with a hx of chronicatrial fibrillation,CHADSVASC>2,diastolic heart failure,CKD4, TIA,and hypertension.Twin sister has PAF and CDHF.  She has had episodic episodes of right heart failure with significant volume overload.  Aggressive diuresis leads II and increase in creatinine and a reduction in diuretic therapy by nephrology.  On 40 mg of furosemide per day, she again developed significant bilateral lower extremity swelling and dyspnea requiring an emergency room visit on 02/20/2021.  IV diuresis led to improvement in swelling and breathing.  I spoke with Dr. Royce Macadamia this morning and we agreed that we should clear the patient's congestion with 80 mg of furosemide twice daily for 2 days.  I have also decided to slightly increase the diuretic intensity to 60 mg daily.  She will have a basic metabolic panel done shortly thereafter.  In reviewing the data, the recent echo revealed normal systolic function but an enlarged right ventricle with hypokinesis.  This is likely related to significant functional mitral regurgitation causing pulmonary hypertension and right heart strain.  I discussed the concept of atrial remodeling leading to functional mitral regurgitation.  We discussed transesophageal echo to document severity of mitral regurgitation.  May then refer to the structural heart clinic to consider MitraClip.  Past Medical History:  Diagnosis Date  . Allergic rhinitis due to pollen   . Atrial fibrillation ([facility]) 12/23/2011  . Atrial fibrillation with rapid ventricular response ([facility])   . Chronic diastolic HF (heart failure)  ([facility]) 11/21/2012  . CKD (chronic kidney disease) stage 3, GFR 30-59 ml/min ([facility]) 04/19/2019  . Congestive heart failure, unspecified    patient states she is not aware of this  . Deep venous thrombosis of lower extremity ([facility]) 06/02/2012  . Deviated septum 01/11/2020  . Dizziness   . DVT (deep venous thrombosis) ([facility]) left leg  . Epistaxis 01/11/2020  . Essential hypertension, malignant   . GERD (gastroesophageal reflux disease)   . Heart murmur   . Insulin resistance    History of insulin resistance.  . Insulin resistance   . Long term current use of anticoagulant therapy 02/08/2014   coumadin   . Obesity   . Pure hypercholesterolemia   . TIA (transient ischemic attack) 10/02/2018  . Vertigo     Past Surgical History:  Procedure Laterality Date  . APPENDECTOMY    . BREAST BIOPSY    . CHOLECYSTECTOMY    . PARTIAL HYSTERECTOMY      Current Medications: Current Meds  Medication Sig  . amLODipine (NORVASC) 5 MG tablet Take 1 tablet (5 mg total) by mouth daily.  Marland Kitchen atenolol (TENORMIN) 100 MG tablet Take 100 mg by mouth daily.  . Azelastine HCl 137 MCG/SPRAY SOLN Place 1 spray into both nostrils as needed.  . benzonatate (TESSALON) 200 MG capsule Take 200 mg by mouth 3 (three) times daily as needed.  Marland Kitchen ELIQUIS 2.5 MG TABS tablet Take 1 tablet by mouth 2 (two) times daily.  Arna Medici 25 MCG tablet Take 25 mcg by mouth daily.  . furosemide (LASIX) 40 MG tablet Take 40 mg by mouth  daily.  . irbesartan (AVAPRO) 75 MG tablet Take 1 tablet by mouth once daily  . montelukast (SINGULAIR) 10 MG tablet Take 10 mg by mouth daily.  . predniSONE (STERAPRED UNI-PAK 21 TAB) 5 MG (21) TBPK tablet Take 1 tablet by mouth daily.     Allergies:   Codeine, Lovenox [enoxaparin sodium], Norvasc [amlodipine besylate], Sulfa antibiotics, and Latex   Social History   Socioeconomic History  . Marital status: Single    Spouse name: Not on file  . Number of children: Not on file  . Years of education:  Not on file  . Highest education level: Not on file  Occupational History  . Not on file  Tobacco Use  . Smoking status: Never Smoker  . Smokeless tobacco: Never Used  Substance and Sexual Activity  . Alcohol use: No  . Drug use: No  . Sexual activity: Not Currently    Birth control/protection: Post-menopausal  Other Topics Concern  . Not on file  Social History Narrative  . Not on file   Social Determinants of Health   Financial Resource Strain: Not on file  Food Insecurity: Not on file  Transportation Needs: Not on file  Physical Activity: Not on file  Stress: Not on file  Social Connections: Not on file     Family History: The patient's family history includes Diabetes in her mother; Hypertension in her sister; Pulmonary embolism in her sister.  ROS:   Please see the history of present illness.    Feels better than she did when she went to the emergency room.  Creatinine was 1.5 or less.  She is back on 40 mg of furosemide per day.  Shortness of breath is improved.  All other systems reviewed and are negative.  EKGs/Labs/Other Studies Reviewed:    The following studies were reviewed today:  2D Doppler echocardiogram performed January 30, 2021:  IMPRESSIONS  1. Left ventricular ejection fraction, by estimation, is 50 to 55%. The  left ventricle has low normal function. Left ventricular endocardial  border not optimally defined to evaluate regional wall motion. Left  ventricular diastolic parameters are  indeterminate. There is the interventricular septum is flattened in  diastole ('D' shaped left ventricle), consistent with right ventricular  volume overload.  2. Right ventricular systolic function is mildly reduced. The right  ventricular size is moderately enlarged. There is moderately elevated  pulmonary artery systolic pressure. The estimated right ventricular  systolic pressure is 76.5 mmHg.  3. Tricuspid valve regurgitation is severe.  4. Left atrial  size was severely dilated.  5. Right atrial size was severely dilated.  6. The mitral valve is grossly normal. Mild to moderate mitral valve  regurgitation. No evidence of mitral stenosis.  7. The aortic valve is abnormal. There is moderate calcification of the  aortic valve. Aortic valve regurgitation is not visualized. Aortic valve  sclerosis/calcification is present, without any evidence of aortic  stenosis.  8. The inferior vena cava is dilated in size with <50% respiratory  variability, suggesting right atrial pressure of 15 mmHg.   EKG:  EKG atrial fibrillation, rightward axis, poor R wave progression, occasional Ashman beat.  Recent Labs: 05/02/2020: TSH 3.440 07/05/2020: NT-Pro BNP 3,046 02/20/2021: B Natriuretic Peptide 1,037.1; BUN 44; Creatinine, Ser 1.48; Hemoglobin 10.4; Platelets 289; Potassium 4.5; Sodium 131  Recent Lipid Panel    Component Value Date/Time   CHOL 95 10/03/2018 0521   TRIG 45 10/03/2018 0521   HDL 26 (L) 10/03/2018 0521   CHOLHDL  3.7 10/03/2018 0521   VLDL 9 10/03/2018 0521   LDLCALC 60 10/03/2018 0521    Physical Exam:    VS:  BP 136/70   Pulse 90   Ht 5' 4"  (1.626 m)   Wt 148 lb (67.1 kg)   SpO2 99%   BMI 25.40 kg/m     Wt Readings from Last 3 Encounters:  02/22/21 148 lb (67.1 kg)  01/10/21 152 lb 9.6 oz (69.2 kg)  09/07/20 147 lb 3.2 oz (66.8 kg)     GEN: Elderly, losing weight.. No acute distress HEENT: Normal NECK: Marked CV wave to the angle of the jaw bilaterally. LYMPHATICS: No lymphadenopathy CARDIAC: 3/6 left mid sternal to apical hello murmur of mitral regurgitation.. RRR no gallop, and improved bilateral edema down from the kneecaps bilaterally to the mid shin bilaterally.Marland Kitchen VASCULAR:  Normal Pulses. No bruits. RESPIRATORY:  Clear to auscultation without rales, wheezing or rhonchi  ABDOMEN: Soft, non-tender, non-distended, No pulsatile mass, MUSCULOSKELETAL: No deformity  SKIN: Warm and dry NEUROLOGIC:  Alert and  oriented x 3 PSYCHIATRIC:  Normal affect   ASSESSMENT:    1. Nonrheumatic mitral valve regurgitation   2. Chronic diastolic HF (heart failure) ([facility])   3. Stage 3a chronic kidney disease ([place])   4. Permanent atrial fibrillation ([facility])   5. Long term current use of anticoagulant therapy   6. Essential hypertension   7. Bilateral edema of lower extremity    PLAN:    In order of problems listed above:  1. The patient has significant mitral regurgitation and severe tricuspid regurgitation.  Both are likely the result of long-term atrial fibrillation with bilateral atrial remodeling resulting in functional regurgitation.  The plan at this point will be to decongest the patient and get a transesophageal echo to determine if consideration of MitraClip would be helpful at decreasing pulmonary hypertension and improving her overall forward cardiac output.  This may make renal function more stable with diuresis.  She is willing to proceed. 2. Her EF is greater than 55%.  We will increase furosemide to 80 mg daily for 2 days then decrease it to 60 mg/day.  Be met will be done within 7 days. 3. Creatinine was 1.5, two days ago.  With increased diuresis the creatinine will increase as it has in the past.  Fifth mitral regurgitation can be reduced, forward output may allow the kidneys to be more stable in the face of diuresis. 4. Atrial for is controlled rate. 5. Continue anticoagulation. 6. Severe right heart failure with peripheral edema.  Watch for hypotension with diuresis.  Guideline directed therapy for left ventricular systolic dysfunction: Angiotensin receptor-neprilysin inhibitor (ARNI)-Entresto; beta-blocker therapy - carvedilol, metoprolol succinate, or bisoprolol; mineralocorticoid receptor antagonist (MRA) therapy -spironolactone or eplerenone.  SGLT-2 agents -  Dapagliflozin [place]) or Empagliflozin ([place]).These therapies have been shown to improve clinical outcomes including reduction  of rehospitalization, survival, and acute heart failure.     Medication Adjustments/Labs and Tests Ordered: Current medicines are reviewed at length with the patient today.  Concerns regarding medicines are outlined above.  No orders of the defined types were placed in this encounter.  No orders of the defined types were placed in this encounter.   There are no Patient Instructions on file for this visit.   Signed, Sinclair Grooms, MD  02/22/2021 11:27 AM    [place]

## 2021-02-22 NOTE — Patient Instructions (Signed)
Medication Instructions:  1) TAKE Furosemide '80mg'$  as soon as you get home today.  TOMORROW take '40mg'$  in the morning and in the evening.  Starting Saturday take '60mg'$  (1.5 tablets) in the morning.  *If you need a refill on your cardiac medications before your next appointment, please call your pharmacy*   Lab Work: BMET and CBC next Friday  If you have labs (blood work) drawn today and your tests are completely normal, you will receive your results only by: Marland Kitchen MyChart Message (if you have MyChart) OR . A paper copy in the mail If you have any lab test that is abnormal or we need to change your treatment, we will call you to review the results.   Testing/Procedures: Your physician has requested that you have a TEE. During a TEE, sound waves are used to create images of your heart. It provides your doctor with information about the size and shape of your heart and how well your heart's chambers and valves are working. In this test, a transducer is attached to the end of a flexible tube that's guided down your throat and into your esophagus (the tube leading from you mouth to your stomach) to get a more detailed image of your heart. You are not awake for the procedure. Please see the instruction sheet given to you today. For further information please visit [email or web address].    Follow-Up: At [facility], you and your health needs are our priority.  As part of our continuing mission to provide you with exceptional heart care, we have created designated Provider Care Teams.  These Care Teams include your primary Cardiologist (physician) and Advanced Practice Providers (APPs -  Physician Assistants and Nurse Practitioners) who all work together to provide you with the care you need, when you need it.  We recommend signing up for the patient portal called "MyChart".  Sign up information is provided on this After Visit Summary.  MyChart is used to connect with patients for Virtual Visits  (Telemedicine).  Patients are able to view lab/test results, encounter notes, upcoming appointments, etc.  Non-urgent messages can be sent to your provider as well.   To learn more about what you can do with MyChart, go to [email or web address].    Your next appointment:   1 month(s)  The format for your next appointment:   In Person  Provider:   You may see Sinclair Grooms, MD or one of the following Advanced Practice Providers on your designated Care Team:    Kathyrn Drown, NP    Other Instructions  Due to recent COVID-19 restrictions implemented by our local and state authorities and in an effort to keep both patients and staff as safe as possible, our hospital system requires COVID-19 testing prior to certain scheduled hospital procedures.  Please go to [address] [place], [place] [id number] on March 09, 2021 at 1:10pm.  This is a drive up testing site.  You will not need to exit your vehicle.  You must agree to self-quarantine from the time of your testing until the procedure date on March 12, 2021.  This should included staying home with ONLY the people you live with.  Avoid take-out, grocery store shopping or leaving the house for any non-emergent reason.  Failure to have your COVID-19 test done on the date and time you have been scheduled will result in cancellation of your procedure.  Please call our office at [phone] if you have any questions.   Dear Ms. Jasmine [name],  You are scheduled for a TEE on Monday, April 4th with Dr. Gasper Sells.  Please arrive at the [facility] (Main Entrance A) at [facility]: [address] [place], [place] [address] at 9:30 am.  DIET: Nothing to eat or drink after midnight except a sip of water with medications (see medication instructions below)  Medication Instructions: Hold Furosemide the morning of your procedure.  Continue your anticoagulant: Eliquis You will need to continue your anticoagulant after your procedure until you  are told by your  Provider that it is safe to stop  You must have a responsible person to drive you home and stay in the waiting area during your procedure. Failure to do so could result in cancellation.  Bring your insurance cards.  *Special Note: Every effort is made to have your procedure done on time. Occasionally there are emergencies that occur at the hospital that may cause delays. Please be patient if a delay does occur.

## 2021-02-22 NOTE — H&P (View-Only) (Signed)
Cardiology Office Note:    Date:  02/22/2021   ID:  Jasmine [name], DOB December 03, 1934, MRN [id number]  PCP:  [name], MD  Cardiologist:  Sinclair Grooms, MD   Referring MD: [name], MD   Chief Complaint  Patient presents with  . Atrial Fibrillation  . Congestive Heart Failure    History of Present Illness:    Jasmine [name] is a 87 y.o. female with a hx of chronicatrial fibrillation,CHADSVASC>2,diastolic heart failure,CKD4, TIA,and hypertension.Twin sister has PAF and CDHF.  She has had episodic episodes of right heart failure with significant volume overload.  Aggressive diuresis leads II and increase in creatinine and a reduction in diuretic therapy by nephrology.  On 40 mg of furosemide per day, she again developed significant bilateral lower extremity swelling and dyspnea requiring an emergency room visit on 02/20/2021.  IV diuresis led to improvement in swelling and breathing.  I spoke with Dr. Royce Macadamia this morning and we agreed that we should clear the patient's congestion with 80 mg of furosemide twice daily for 2 days.  I have also decided to slightly increase the diuretic intensity to 60 mg daily.  She will have a basic metabolic panel done shortly thereafter.  In reviewing the data, the recent echo revealed normal systolic function but an enlarged right ventricle with hypokinesis.  This is likely related to significant functional mitral regurgitation causing pulmonary hypertension and right heart strain.  I discussed the concept of atrial remodeling leading to functional mitral regurgitation.  We discussed transesophageal echo to document severity of mitral regurgitation.  May then refer to the structural heart clinic to consider MitraClip.  Past Medical History:  Diagnosis Date  . Allergic rhinitis due to pollen   . Atrial fibrillation ([place]) 12/23/2011  . Atrial fibrillation with rapid ventricular response ([place])   . Chronic diastolic HF (heart failure)  ([place]) 11/21/2012  . CKD (chronic kidney disease) stage 3, GFR 30-59 ml/min ([place]) 04/19/2019  . Congestive heart failure, unspecified    patient states she is not aware of this  . Deep venous thrombosis of lower extremity ([place]) 06/02/2012  . Deviated septum 01/11/2020  . Dizziness   . DVT (deep venous thrombosis) ([facility]) left leg  . Epistaxis 01/11/2020  . Essential hypertension, malignant   . GERD (gastroesophageal reflux disease)   . Heart murmur   . Insulin resistance    History of insulin resistance.  . Insulin resistance   . Long term current use of anticoagulant therapy 02/08/2014   coumadin   . Obesity   . Pure hypercholesterolemia   . TIA (transient ischemic attack) 10/02/2018  . Vertigo     Past Surgical History:  Procedure Laterality Date  . APPENDECTOMY    . BREAST BIOPSY    . CHOLECYSTECTOMY    . PARTIAL HYSTERECTOMY      Current Medications: Current Meds  Medication Sig  . amLODipine (NORVASC) 5 MG tablet Take 1 tablet (5 mg total) by mouth daily.  Marland Kitchen atenolol (TENORMIN) 100 MG tablet Take 100 mg by mouth daily.  . Azelastine HCl 137 MCG/SPRAY SOLN Place 1 spray into both nostrils as needed.  . benzonatate (TESSALON) 200 MG capsule Take 200 mg by mouth 3 (three) times daily as needed.  Marland Kitchen ELIQUIS 2.5 MG TABS tablet Take 1 tablet by mouth 2 (two) times daily.  Arna Medici 25 MCG tablet Take 25 mcg by mouth daily.  . furosemide (LASIX) 40 MG tablet Take 40 mg by mouth  daily.  . irbesartan (AVAPRO) 75 MG tablet Take 1 tablet by mouth once daily  . montelukast (SINGULAIR) 10 MG tablet Take 10 mg by mouth daily.  . predniSONE (STERAPRED UNI-PAK 21 TAB) 5 MG (21) TBPK tablet Take 1 tablet by mouth daily.     Allergies:   Codeine, Lovenox [enoxaparin sodium], Norvasc [amlodipine besylate], Sulfa antibiotics, and Latex   Social History   Socioeconomic History  . Marital status: Single    Spouse name: Not on file  . Number of children: Not on file  . Years of education:  Not on file  . Highest education level: Not on file  Occupational History  . Not on file  Tobacco Use  . Smoking status: Never Smoker  . Smokeless tobacco: Never Used  Substance and Sexual Activity  . Alcohol use: No  . Drug use: No  . Sexual activity: Not Currently    Birth control/protection: Post-menopausal  Other Topics Concern  . Not on file  Social History Narrative  . Not on file   Social Determinants of Health   Financial Resource Strain: Not on file  Food Insecurity: Not on file  Transportation Needs: Not on file  Physical Activity: Not on file  Stress: Not on file  Social Connections: Not on file     Family History: The patient's family history includes Diabetes in her mother; Hypertension in her sister; Pulmonary embolism in her sister.  ROS:   Please see the history of present illness.    Feels better than she did when she went to the emergency room.  Creatinine was 1.5 or less.  She is back on 40 mg of furosemide per day.  Shortness of breath is improved.  All other systems reviewed and are negative.  EKGs/Labs/Other Studies Reviewed:    The following studies were reviewed today:  2D Doppler echocardiogram performed January 30, 2021:  IMPRESSIONS  1. Left ventricular ejection fraction, by estimation, is 50 to 55%. The  left ventricle has low normal function. Left ventricular endocardial  border not optimally defined to evaluate regional wall motion. Left  ventricular diastolic parameters are  indeterminate. There is the interventricular septum is flattened in  diastole ('D' shaped left ventricle), consistent with right ventricular  volume overload.  2. Right ventricular systolic function is mildly reduced. The right  ventricular size is moderately enlarged. There is moderately elevated  pulmonary artery systolic pressure. The estimated right ventricular  systolic pressure is 50.2 mmHg.  3. Tricuspid valve regurgitation is severe.  4. Left atrial  size was severely dilated.  5. Right atrial size was severely dilated.  6. The mitral valve is grossly normal. Mild to moderate mitral valve  regurgitation. No evidence of mitral stenosis.  7. The aortic valve is abnormal. There is moderate calcification of the  aortic valve. Aortic valve regurgitation is not visualized. Aortic valve  sclerosis/calcification is present, without any evidence of aortic  stenosis.  8. The inferior vena cava is dilated in size with <50% respiratory  variability, suggesting right atrial pressure of 15 mmHg.   EKG:  EKG atrial fibrillation, rightward axis, poor R wave progression, occasional Ashman beat.  Recent Labs: 05/02/2020: TSH 3.440 07/05/2020: NT-Pro BNP 3,046 02/20/2021: B Natriuretic Peptide 1,037.1; BUN 44; Creatinine, Ser 1.48; Hemoglobin 10.4; Platelets 289; Potassium 4.5; Sodium 131  Recent Lipid Panel    Component Value Date/Time   CHOL 95 10/03/2018 0521   TRIG 45 10/03/2018 0521   HDL 26 (L) 10/03/2018 0521   CHOLHDL  3.7 10/03/2018 0521   VLDL 9 10/03/2018 0521   LDLCALC 60 10/03/2018 0521    Physical Exam:    VS:  BP 136/70   Pulse 90   Ht 5' 4"  (1.626 m)   Wt 148 lb (67.1 kg)   SpO2 99%   BMI 25.40 kg/m     Wt Readings from Last 3 Encounters:  02/22/21 148 lb (67.1 kg)  01/10/21 152 lb 9.6 oz (69.2 kg)  09/07/20 147 lb 3.2 oz (66.8 kg)     GEN: Elderly, losing weight.. No acute distress HEENT: Normal NECK: Marked CV wave to the angle of the jaw bilaterally. LYMPHATICS: No lymphadenopathy CARDIAC: 3/6 left mid sternal to apical hello murmur of mitral regurgitation.. RRR no gallop, and improved bilateral edema down from the kneecaps bilaterally to the mid shin bilaterally.Marland Kitchen VASCULAR:  Normal Pulses. No bruits. RESPIRATORY:  Clear to auscultation without rales, wheezing or rhonchi  ABDOMEN: Soft, non-tender, non-distended, No pulsatile mass, MUSCULOSKELETAL: No deformity  SKIN: Warm and dry NEUROLOGIC:  Alert and  oriented x 3 PSYCHIATRIC:  Normal affect   ASSESSMENT:    1. Nonrheumatic mitral valve regurgitation   2. Chronic diastolic HF (heart failure) ([facility])   3. Stage 3a chronic kidney disease ([place])   4. Permanent atrial fibrillation ([place])   5. Long term current use of anticoagulant therapy   6. Essential hypertension   7. Bilateral edema of lower extremity    PLAN:    In order of problems listed above:  1. The patient has significant mitral regurgitation and severe tricuspid regurgitation.  Both are likely the result of long-term atrial fibrillation with bilateral atrial remodeling resulting in functional regurgitation.  The plan at this point will be to decongest the patient and get a transesophageal echo to determine if consideration of MitraClip would be helpful at decreasing pulmonary hypertension and improving her overall forward cardiac output.  This may make renal function more stable with diuresis.  She is willing to proceed. 2. Her EF is greater than 55%.  We will increase furosemide to 80 mg daily for 2 days then decrease it to 60 mg/day.  Be met will be done within 7 days. 3. Creatinine was 1.5, two days ago.  With increased diuresis the creatinine will increase as it has in the past.  Fifth mitral regurgitation can be reduced, forward output may allow the kidneys to be more stable in the face of diuresis. 4. Atrial for is controlled rate. 5. Continue anticoagulation. 6. Severe right heart failure with peripheral edema.  Watch for hypotension with diuresis.  Guideline directed therapy for left ventricular systolic dysfunction: Angiotensin receptor-neprilysin inhibitor (ARNI)-Entresto; beta-blocker therapy - carvedilol, metoprolol succinate, or bisoprolol; mineralocorticoid receptor antagonist (MRA) therapy -spironolactone or eplerenone.  SGLT-2 agents -  Dapagliflozin [place]) or Empagliflozin ([place]).These therapies have been shown to improve clinical outcomes including reduction  of rehospitalization, survival, and acute heart failure.     Medication Adjustments/Labs and Tests Ordered: Current medicines are reviewed at length with the patient today.  Concerns regarding medicines are outlined above.  No orders of the defined types were placed in this encounter.  No orders of the defined types were placed in this encounter.   There are no Patient Instructions on file for this visit.   Signed, Sinclair Grooms, MD  02/22/2021 11:27 AM    [place]

## 2021-03-02 ENCOUNTER — Other Ambulatory Visit: Payer: Medicare Other | Admitting: *Deleted

## 2021-03-02 ENCOUNTER — Other Ambulatory Visit: Payer: Self-pay

## 2021-03-02 DIAGNOSIS — I340 Nonrheumatic mitral (valve) insufficiency: Secondary | ICD-10-CM

## 2021-03-02 DIAGNOSIS — I5032 Chronic diastolic (congestive) heart failure: Secondary | ICD-10-CM

## 2021-03-02 LAB — BASIC METABOLIC PANEL
BUN/Creatinine Ratio: 27 (ref 12–28)
BUN: 44 mg/dL — ABNORMAL HIGH (ref 8–27)
CO2: 19 mmol/L — ABNORMAL LOW (ref 20–29)
Calcium: 9.2 mg/dL (ref 8.7–10.3)
Chloride: 100 mmol/L (ref 96–106)
Creatinine, Ser: 1.66 mg/dL — ABNORMAL HIGH (ref 0.57–1.00)
Glucose: 145 mg/dL — ABNORMAL HIGH (ref 65–99)
Potassium: 4.3 mmol/L (ref 3.5–5.2)
Sodium: 139 mmol/L (ref 134–144)
eGFR: 30 mL/min/{1.73_m2} — ABNORMAL LOW (ref >59)

## 2021-03-02 LAB — CBC
Hematocrit: 32.4 % — ABNORMAL LOW (ref 34.0–46.6)
Hemoglobin: 10.5 g/dL — ABNORMAL LOW (ref 11.1–15.9)
MCH: 31 pg (ref 26.6–33.0)
MCHC: 32.4 g/dL (ref 31.5–35.7)
MCV: 96 fL (ref 79–97)
Platelets: 229 10*3/uL (ref 150–450)
RBC: 3.39 x10E6/uL — ABNORMAL LOW (ref 3.77–5.28)
RDW: 12.8 % (ref 11.7–15.4)
WBC: 8.1 10*3/uL (ref 3.4–10.8)

## 2021-03-09 ENCOUNTER — Other Ambulatory Visit (HOSPITAL_COMMUNITY)
Admission: RE | Admit: 2021-03-09 | Discharge: 2021-03-09 | Disposition: A | Discharge status: Home / Self Care | Payer: Medicare Other | Source: Ambulatory Visit | Attending: Internal Medicine | Admitting: Internal Medicine

## 2021-03-09 ENCOUNTER — Other Ambulatory Visit: Payer: Self-pay | Admitting: Interventional Cardiology

## 2021-03-09 DIAGNOSIS — Z01812 Encounter for preprocedural laboratory examination: Secondary | ICD-10-CM | POA: Insufficient documentation

## 2021-03-09 DIAGNOSIS — Z20822 Contact with and (suspected) exposure to covid-19: Secondary | ICD-10-CM | POA: Insufficient documentation

## 2021-03-10 LAB — SARS CORONAVIRUS 2 (TAT 6-24 HRS): SARS Coronavirus 2: NEGATIVE

## 2021-03-12 ENCOUNTER — Ambulatory Visit (HOSPITAL_COMMUNITY): Payer: Medicare Other | Admitting: Anesthesiology

## 2021-03-12 ENCOUNTER — Ambulatory Visit (HOSPITAL_BASED_OUTPATIENT_CLINIC_OR_DEPARTMENT_OTHER): Payer: Medicare Other

## 2021-03-12 ENCOUNTER — Other Ambulatory Visit: Payer: Self-pay

## 2021-03-12 ENCOUNTER — Ambulatory Visit (HOSPITAL_COMMUNITY)
Admission: RE | Admit: 2021-03-12 | Discharge: 2021-03-12 | Disposition: A | Discharge status: Home / Self Care | Payer: Medicare Other | Attending: Internal Medicine | Admitting: Internal Medicine

## 2021-03-12 ENCOUNTER — Encounter (HOSPITAL_COMMUNITY)
Admission: RE | Disposition: A | Discharge status: Home / Self Care | Payer: Self-pay | Source: Home / Self Care | Attending: Internal Medicine

## 2021-03-12 ENCOUNTER — Encounter (HOSPITAL_COMMUNITY): Payer: Self-pay | Admitting: Internal Medicine

## 2021-03-12 ENCOUNTER — Telehealth: Payer: Self-pay | Admitting: Internal Medicine

## 2021-03-12 DIAGNOSIS — I700 Atherosclerosis of aorta: Secondary | ICD-10-CM | POA: Insufficient documentation

## 2021-03-12 DIAGNOSIS — J90 Pleural effusion, not elsewhere classified: Secondary | ICD-10-CM | POA: Diagnosis not present

## 2021-03-12 DIAGNOSIS — I340 Nonrheumatic mitral (valve) insufficiency: Secondary | ICD-10-CM | POA: Diagnosis not present

## 2021-03-12 DIAGNOSIS — Z7901 Long term (current) use of anticoagulants: Secondary | ICD-10-CM | POA: Diagnosis not present

## 2021-03-12 DIAGNOSIS — I5082 Biventricular heart failure: Secondary | ICD-10-CM | POA: Insufficient documentation

## 2021-03-12 DIAGNOSIS — Z885 Allergy status to narcotic agent status: Secondary | ICD-10-CM | POA: Diagnosis not present

## 2021-03-12 DIAGNOSIS — I4821 Permanent atrial fibrillation: Secondary | ICD-10-CM | POA: Insufficient documentation

## 2021-03-12 DIAGNOSIS — I130 Hypertensive heart and chronic kidney disease with heart failure and stage 1 through stage 4 chronic kidney disease, or unspecified chronic kidney disease: Secondary | ICD-10-CM | POA: Insufficient documentation

## 2021-03-12 DIAGNOSIS — Z91040 Latex allergy status: Secondary | ICD-10-CM | POA: Insufficient documentation

## 2021-03-12 DIAGNOSIS — Z882 Allergy status to sulfonamides status: Secondary | ICD-10-CM | POA: Insufficient documentation

## 2021-03-12 DIAGNOSIS — Z79899 Other long term (current) drug therapy: Secondary | ICD-10-CM | POA: Diagnosis not present

## 2021-03-12 DIAGNOSIS — I361 Nonrheumatic tricuspid (valve) insufficiency: Secondary | ICD-10-CM

## 2021-03-12 DIAGNOSIS — I083 Combined rheumatic disorders of mitral, aortic and tricuspid valves: Secondary | ICD-10-CM | POA: Diagnosis present

## 2021-03-12 DIAGNOSIS — I5032 Chronic diastolic (congestive) heart failure: Secondary | ICD-10-CM | POA: Insufficient documentation

## 2021-03-12 DIAGNOSIS — N184 Chronic kidney disease, stage 4 (severe): Secondary | ICD-10-CM | POA: Diagnosis not present

## 2021-03-12 HISTORY — PX: TEE WITHOUT CARDIOVERSION: SHX5443

## 2021-03-12 SURGERY — ECHOCARDIOGRAM, TRANSESOPHAGEAL
Anesthesia: Monitor Anesthesia Care

## 2021-03-12 MED ORDER — PROMETHAZINE HCL 25 MG/ML IJ SOLN: 6.2500 mg | INTRAMUSCULAR | Status: DC | PRN

## 2021-03-12 MED ORDER — LACTATED RINGERS IV SOLN: INTRAVENOUS | Status: DC

## 2021-03-12 MED ORDER — BUTAMBEN-TETRACAINE-BENZOCAINE 2-2-14 % EX AERO
INHALATION_SPRAY | CUTANEOUS | Status: DC | PRN
  Administered 2021-03-12: 2 via TOPICAL

## 2021-03-12 MED ORDER — PROPOFOL 500 MG/50ML IV EMUL
INTRAVENOUS | Status: DC | PRN
  Administered 2021-03-12: 100 ug/kg/min via INTRAVENOUS

## 2021-03-12 MED ORDER — LACTATED RINGERS IV SOLN: INTRAVENOUS | Status: DC | PRN

## 2021-03-12 MED ORDER — PHENYLEPHRINE HCL (PRESSORS) 10 MG/ML IV SOLN
INTRAVENOUS | Status: DC | PRN
  Administered 2021-03-12 (×3): 80 ug via INTRAVENOUS

## 2021-03-12 MED ORDER — METOLAZONE 2.5 MG PO TABS: 2.5000 mg | ORAL_TABLET | Freq: Every day | ORAL | 0 refills | Status: DC

## 2021-03-12 MED ORDER — SODIUM CHLORIDE 0.9 % IV SOLN: INTRAVENOUS | Status: DC

## 2021-03-12 NOTE — Anesthesia Preprocedure Evaluation (Addendum)
Anesthesia Evaluation  Patient identified by MRN, date of birth, ID band Patient awake    Reviewed: Allergy & Precautions, NPO status , Patient's Chart, lab work & pertinent test results  History of Anesthesia Complications Negative for: history of anesthetic complications  Airway Mallampati: II  TM Distance: >3 FB Neck ROM: Full    Dental no notable dental hx.    Pulmonary neg pulmonary ROS,    Pulmonary exam normal        Cardiovascular hypertension, Pt. on medications and Pt. on home beta blockers +CHF and + DVT  Normal cardiovascular exam+ dysrhythmias Atrial Fibrillation   TTE 01/2021: EF 50-55%, RV volume overload, RV systolic function mildly reduced, moderate RVE, RVSP 49.66mHg, severe TR, severe LAE/RAE, mild to moderate MR     Neuro/Psych TIAnegative psych ROS   GI/Hepatic Neg liver ROS, GERD  ,  Endo/Other  negative endocrine ROS  Renal/GU Renal disease  negative genitourinary   Musculoskeletal negative musculoskeletal ROS (+)   Abdominal   Peds  Hematology negative hematology ROS (+)   Anesthesia Other Findings Day of surgery medications reviewed with patient.  Reproductive/Obstetrics negative OB ROS                            Anesthesia Physical Anesthesia Plan  ASA: III  Anesthesia Plan: MAC   Post-op Pain Management:    Induction:   PONV Risk Score and Plan: Treatment may vary due to age or medical condition and Propofol infusion  Airway Management Planned: Natural Airway and Nasal Cannula  Additional Equipment: None  Intra-op Plan:   Post-operative Plan:   Informed Consent: I have reviewed the patients History and Physical, chart, labs and discussed the procedure including the risks, benefits and alternatives for the proposed anesthesia with the patient or authorized representative who has indicated his/her understanding and acceptance.       Plan Discussed  with: CRNA  Anesthesia Plan Comments:        Anesthesia Quick Evaluation

## 2021-03-12 NOTE — Transfer of Care (Signed)
Immediate Anesthesia Transfer of Care Note  Patient: Jasmine [name]  Procedure(s) Performed: TRANSESOPHAGEAL ECHOCARDIOGRAM (TEE) (N/A )  Patient Location: Endoscopy Unit  Anesthesia Type:MAC  Level of Consciousness: awake, alert  and oriented  Airway & Oxygen Therapy: Patient Spontanous Breathing and Patient connected to nasal cannula oxygen  Post-op Assessment: Report given to RN, Post -op Vital signs reviewed and stable and Patient moving all extremities X 4  Post vital signs: Reviewed and stable  Last Vitals:  Vitals Value Taken Time  BP 102/66 03/12/21 1159  Temp    Pulse 62 03/12/21 1159  Resp 25 03/12/21 1159  SpO2 100 % 03/12/21 1159  Vitals shown include unvalidated device data.  Last Pain:  Vitals:   03/12/21 1155  TempSrc:   PainSc: 0-No pain         Complications: No complications documented.

## 2021-03-12 NOTE — Telephone Encounter (Signed)
Discontinuing metolazone until after follow up with Dr. Tamala Julian or Nephrology.  Discussed with patient prior to leaving.  [place] to stop prescription.  Werner Lean, MD

## 2021-03-12 NOTE — Discharge Instructions (Signed)

## 2021-03-12 NOTE — Anesthesia Procedure Notes (Signed)
Procedure Name: MAC Date/Time: 03/12/2021 11:10 AM Performed by: Gaylene Brooks, CRNA Pre-anesthesia Checklist: Patient identified, Emergency Drugs available, Suction available and Patient being monitored Patient Re-evaluated:Patient Re-evaluated prior to induction Oxygen Delivery Method: Nasal cannula Induction Type: IV induction Placement Confirmation: positive ETCO2

## 2021-03-12 NOTE — Anesthesia Postprocedure Evaluation (Signed)
Anesthesia Post Note  Patient: Jasmine [name]  Procedure(s) Performed: TRANSESOPHAGEAL ECHOCARDIOGRAM (TEE) (N/A )     Patient location during evaluation: PACU Anesthesia Type: MAC Level of consciousness: awake and alert and oriented Pain management: pain level controlled Vital Signs Assessment: post-procedure vital signs reviewed and stable Respiratory status: spontaneous breathing, nonlabored ventilation and respiratory function stable Cardiovascular status: blood pressure returned to baseline Postop Assessment: no apparent nausea or vomiting Anesthetic complications: no   No complications documented.  Last Vitals:  Vitals:   03/12/21 1214 03/12/21 1228  BP: (!) 124/45 (!) 92/56  Pulse: (!) 46 91  Resp: (!) 22 19  Temp:    SpO2: 100% 99%    Last Pain:  Vitals:   03/12/21 1228  TempSrc:   PainSc: 0-No pain                 Brennan Bailey

## 2021-03-12 NOTE — Interval H&P Note (Signed)
History and Physical Interval Note:  03/12/2021 10:56 AM  Jasmine [name]  has presented today for surgery, with the diagnosis of MITRAL REGURGIGATION.  The various methods of treatment have been discussed with the patient and family. After consideration of risks, benefits and other options for treatment, the patient has consented to  Procedure(s): TRANSESOPHAGEAL ECHOCARDIOGRAM (TEE) (N/A) as a surgical intervention.  The patient's history has been reviewed, patient examined, no change in status, stable for surgery.  I have reviewed the patient's chart and labs.  Questions were answered to the patient's satisfaction.     Breahna Boylen A Trevell Pariseau

## 2021-03-12 NOTE — CV Procedure (Addendum)
    TRANSESOPHAGEAL ECHOCARDIOGRAM   NAME:  Jasmine [name]    MRN: [id number] DOB:  1934-08-06    ADMIT DATE: 03/12/2021  INDICATIONS: Mitral Regurgitation  PROCEDURE:   Informed consent was obtained prior to the procedure. The risks, benefits and alternatives for the procedure were discussed and the patient comprehended these risks.  Risks include, but are not limited to, cough, sore throat, vomiting, nausea, somnolence, esophageal and stomach trauma or perforation, bleeding, low blood pressure, aspiration, pneumonia, infection, trauma to the teeth and death.    Procedural time out performed. The oropharynx was anesthetized with topical 1% benzocaine.    Anesthesia was administered by Dr. Justice Britain.  The patient was administered a total of Propofol 214 mg to achieve and maintain moderate conscious sedation.  The patient's heart rate, blood pressure, and oxygen saturation are monitored continuously during the procedure. The period of conscious sedation is 27 minutes, of which I was present face-to-face 100% of this time.   The transesophageal probe was inserted in the esophagus and stomach without difficulty and multiple views were obtained.   COMPLICATIONS:    There were no immediate complications.  KEY FINDINGS:  1. Severe mitral regurgitation 2. Mild to moderate mitral regurgitation. 3. Severe Bi-atrial enlargement  4. Full report to follow. 5. Further management per primary team.   Jasmine [name], [place]  11:44 AM   Discussed with Primary Cardiologist-- will defer metolazone addition until after follow up.

## 2021-03-13 NOTE — Progress Notes (Signed)
Attempted, unable to leave message/bt 

## 2021-03-14 ENCOUNTER — Encounter (HOSPITAL_COMMUNITY): Payer: Self-pay | Admitting: Internal Medicine

## 2021-03-27 NOTE — Progress Notes (Signed)
Cardiology Office Note:    Date:  03/28/2021   ID:  Jasmine [name], DOB [birth year], MRN [id number]  PCP:  [name], MD  Cardiologist:  Sinclair Grooms, MD   Referring MD: [name], MD   Chief Complaint  Patient presents with  . Atrial Fibrillation  . Congestive Heart Failure  . Hypertension  . Chronic Kidney Disease    History of Present Illness:    Jasmine [name] is a 87 y.o. female with a hx of  chronicatrial fibrillation,CHADSVASC>2,diastolic heart failure (Twin sister has PAF and CDHF),CKD4, TIA,TR and MR, and primary hypertension.  The transesophageal echocardiogram demonstrated severe tricuspid regurgitation with mild to moderate mitral regurgitation.  Therefore, no need for MitraClip.  After putting all the data together, decided to more aggressively diurese the patient.  Her nephrologist, Dr. Royce Macadamia has given go-ahead to use a higher dose of furosemide.  She has diuresed, lower extremity swelling has improved, and she says she feels better.  Her appetite is even improved.  She feels much better.  Lower extremity swelling has resolved.  Ambulation is improved.  She has not had syncope.  Furosemide dose was increased to 80 mg/day.  Blood work was done by Dr. Royce Macadamia yesterday.  Not all have the results.  Metolazone 5 mg every other week has also been added.  Past Medical History:  Diagnosis Date  . Allergic rhinitis due to pollen   . Atrial fibrillation ([place]) 12/23/2011  . Atrial fibrillation with rapid ventricular response ([place])   . Chronic diastolic HF (heart failure) ([place]) 11/21/2012  . CKD (chronic kidney disease) stage 3, GFR 30-59 ml/min ([place]) 04/19/2019  . Congestive heart failure, unspecified    patient states she is not aware of this  . Deep venous thrombosis of lower extremity ([place]) 06/02/2012  . Deviated septum 01/11/2020  . Dizziness   . DVT (deep venous thrombosis) ([facility]) left leg  . Epistaxis 01/11/2020  . Essential hypertension, malignant    . GERD (gastroesophageal reflux disease)   . Heart murmur   . Insulin resistance    History of insulin resistance.  . Insulin resistance   . Long term current use of anticoagulant therapy 02/08/2014   coumadin   . Obesity   . Pure hypercholesterolemia   . TIA (transient ischemic attack) 10/02/2018  . Vertigo     Past Surgical History:  Procedure Laterality Date  . APPENDECTOMY    . BREAST BIOPSY    . CHOLECYSTECTOMY    . PARTIAL HYSTERECTOMY    . TEE WITHOUT CARDIOVERSION N/A 03/12/2021   Procedure: TRANSESOPHAGEAL ECHOCARDIOGRAM (TEE);  Surgeon: Werner Lean, MD;  Location: [facility] ENDOSCOPY;  Service: Cardiovascular;  Laterality: N/A;    Current Medications: Current Meds  Medication Sig  . atenolol (TENORMIN) 100 MG tablet Take 100 mg by mouth daily.  Marland Kitchen ELIQUIS 2.5 MG TABS tablet Take 2.5 mg by mouth 2 (two) times daily.  Arna Medici 25 MCG tablet Take 25 mcg by mouth daily before breakfast.  . furosemide (LASIX) 80 MG tablet Take 80 mg by mouth.  . irbesartan (AVAPRO) 75 MG tablet Take 1 tablet by mouth once daily (Patient taking differently: Take 75 mg by mouth daily.)  . metolazone (ZAROXOLYN) 5 MG tablet Take 5 mg by mouth. One tablet every other week     Allergies:   Norvasc [amlodipine besylate], Sulfa antibiotics, Codeine, and Latex   Social History   Socioeconomic History  . Marital status: Single  Spouse name: Not on file  . Number of children: Not on file  . Years of education: Not on file  . Highest education level: Not on file  Occupational History  . Not on file  Tobacco Use  . Smoking status: Never Smoker  . Smokeless tobacco: Never Used  Substance and Sexual Activity  . Alcohol use: No  . Drug use: No  . Sexual activity: Not Currently    Birth control/protection: Post-menopausal  Other Topics Concern  . Not on file  Social History Narrative  . Not on file   Social Determinants of Health   Financial Resource Strain: Not on file  Food  Insecurity: Not on file  Transportation Needs: Not on file  Physical Activity: Not on file  Stress: Not on file  Social Connections: Not on file     Family History: The patient's family history includes Diabetes in her mother; Hypertension in her sister; Pulmonary embolism in her sister.  ROS:   Please see the history of present illness.    Patient is overjoyed that she is doing better and has less swelling.  All other systems reviewed and are negative.  EKGs/Labs/Other Studies Reviewed:    The following studies were reviewed today:  TEE 03/12/2021:  Belva Crome, MD  03/15/2021 8:39 AM EDT      Let the patient know the right side of the heart is developing weakness due to severe leak of the tricuspid valve. The mitral valve, which I was concerned about has only mild to moderate leak. There is no particular specific therapy other than increasing diuretic intensity. May have her seen in the congestive heart failure clinic. We will need to discuss. A copy will be sent to [name], MD   IMPRESSIONS  1. Left ventricular ejection fraction, by estimation, is 50 to 55%. The  left ventricle has low normal function.  2. Right ventricular systolic function is moderately reduced. The right  ventricular size is mildly enlarged.  3. Left atrial size was severely dilated. No left atrial/left atrial  appendage thrombus was detected.  4. Right atrial size was severely dilated.  5. Moderate pleural effusion.  6. The mitral valve is grossly normal. Mild to moderate mitral valve  regurgitation.  7. Tricuspid valve regurgitation is severe.  8. The aortic valve is tricuspid. There is moderate calcification of the  aortic valve. Aortic valve regurgitation is trivial.  9. There is mild (Grade II) plaque involving the descending aorta.   Comparison(s): A prior study was performed on 01/30/21. Severe Tricuspid  regurgitation is still very prominent.   TRANSTHORACIC ECHOCARDIOGRAM  01/30/2021: IMPRESSIONS  1. Left ventricular ejection fraction, by estimation, is 50 to 55%. The  left ventricle has low normal function. Left ventricular endocardial  border not optimally defined to evaluate regional wall motion. Left  ventricular diastolic parameters are  indeterminate. There is the interventricular septum is flattened in  diastole ('D' shaped left ventricle), consistent with right ventricular  volume overload.  2. Right ventricular systolic function is mildly reduced. The right  ventricular size is moderately enlarged. There is moderately elevated  pulmonary artery systolic pressure. The estimated right ventricular  systolic pressure is [id number] mmHg.  3. Tricuspid valve regurgitation is severe.  4. Left atrial size was severely dilated.  5. Right atrial size was severely dilated.  6. The mitral valve is grossly normal. Mild to moderate mitral valve  regurgitation. No evidence of mitral stenosis.  7. The aortic valve is abnormal. There  is moderate calcification of the  aortic valve. Aortic valve regurgitation is not visualized. Aortic valve  sclerosis/calcification is present, without any evidence of aortic  stenosis.  8. The inferior vena cava is dilated in size with <50% respiratory  variability, suggesting right atrial pressure of 15 mmHg.    EKG:  EKG not repeated  Recent Labs: 05/02/2020: TSH 3.440 07/05/2020: NT-Pro BNP 3,046 02/20/2021: B Natriuretic Peptide 1,037.1 03/02/2021: BUN 44; Creatinine, Ser 1.66; Hemoglobin 10.5; Platelets 229; Potassium 4.3; Sodium 139  Recent Lipid Panel    Component Value Date/Time   CHOL 95 10/03/2018 0521   TRIG 45 10/03/2018 0521   HDL 26 (L) 10/03/2018 0521   CHOLHDL 3.7 10/03/2018 0521   VLDL 9 10/03/2018 0521   LDLCALC 60 10/03/2018 0521    Physical Exam:    VS:  BP 126/72   Pulse 70   Ht '5\' 4"'$  (1.626 m)   Wt 137 lb 4 oz (62.3 kg)   SpO2 99%   BMI 23.56 kg/m     Wt Readings from Last 3 Encounters:   03/28/21 137 lb 4 oz (62.3 kg)  02/22/21 148 lb (67.1 kg)  01/10/21 152 lb 9.6 oz (69.2 kg)     GEN: Neck veins are seen at the top of the clavicle with patient lying at 45 degrees.  There is marked reduction compared to prior exam.. No acute distress HEENT: Normal NECK: CV wave noted at the top of the clavicle with patient lying at 45.  LYMPHATICS: No lymphadenopathy CARDIAC: Soft systolic murmur left lower sternal border.  Murmur. IIRR no gallop, but with trace bilateral ankle edema, which is a marked improvement compared to the last exam.. VASCULAR:  Normal Pulses. No bruits. RESPIRATORY:  Clear to auscultation without rales, wheezing or rhonchi  ABDOMEN: Soft, non-tender, non-distended, No pulsatile mass, MUSCULOSKELETAL: No deformity  SKIN: Warm and dry NEUROLOGIC:  Alert and oriented x 3 PSYCHIATRIC:  Normal affect   ASSESSMENT:    1. Chronic diastolic HF (heart failure) ([place])   2. Severe tricuspid regurgitation   3. Right heart failure with reduced right ventricular function ([place])   4. Nonrheumatic mitral valve regurgitation   5. Permanent atrial fibrillation ([place])   6. Long term current use of anticoagulant therapy   7. Essential hypertension   8. Bilateral edema of lower extremity   9. Pulmonary HTN ([place])    PLAN:    In order of problems listed above:  1. Diastolic heart failure with right heart failure improved with more aggressive diuresis.  Blood pressure is holding its own.  Weight is down 11 pounds.  Now on furosemide 80 mg/day and metolazone 5 mg every other week.  Blood work is pending from Dr. Royce Macadamia.  Continue current therapy. 2. Clinically improved with diuresis.  This is allowed RV distention to lessen. 3. Right heart failure secondary to left heart failure and atrial fibrillation.  Improved with diuresis. 4. Mitral regurgitation is not severe 5. Rate is well controlled. 6. Continue Eliquis 2.5 mg twice daily.  Dose adjustment is because of stage IV  CKD. 7. Good blood pressure control without orthostasis today.  Continue Tenormin, Avapro, and furosemide.  Metolazone every 14th day. 8. Resolved. 9. Clinically improved.  Clinical follow-up in 4 weeks.  Earlier if low blood pressure, lower extremity swelling fatigue, dizziness, or recurrent shortness of breath.   Medication Adjustments/Labs and Tests Ordered: Current medicines are reviewed at length with the patient today.  Concerns regarding medicines are outlined above.  No orders of the defined types were placed in this encounter.  No orders of the defined types were placed in this encounter.   There are no Patient Instructions on file for this visit.   Signed, Sinclair Grooms, MD  03/28/2021 1:59 PM     [facility]

## 2021-03-28 ENCOUNTER — Other Ambulatory Visit: Payer: Self-pay

## 2021-03-28 ENCOUNTER — Encounter: Payer: Self-pay | Admitting: Interventional Cardiology

## 2021-03-28 ENCOUNTER — Ambulatory Visit: Payer: Medicare Other | Admitting: Interventional Cardiology

## 2021-03-28 VITALS — BP 126/72 | HR 70 | Ht 64.0 in | Wt 137.2 lb

## 2021-03-28 DIAGNOSIS — I50810 Right heart failure, unspecified: Secondary | ICD-10-CM

## 2021-03-28 DIAGNOSIS — I10 Essential (primary) hypertension: Secondary | ICD-10-CM

## 2021-03-28 DIAGNOSIS — I071 Rheumatic tricuspid insufficiency: Secondary | ICD-10-CM | POA: Diagnosis not present

## 2021-03-28 DIAGNOSIS — I340 Nonrheumatic mitral (valve) insufficiency: Secondary | ICD-10-CM

## 2021-03-28 DIAGNOSIS — R600 Localized edema: Secondary | ICD-10-CM

## 2021-03-28 DIAGNOSIS — I5032 Chronic diastolic (congestive) heart failure: Secondary | ICD-10-CM

## 2021-03-28 DIAGNOSIS — I272 Pulmonary hypertension, unspecified: Secondary | ICD-10-CM

## 2021-03-28 DIAGNOSIS — Z7901 Long term (current) use of anticoagulants: Secondary | ICD-10-CM

## 2021-03-28 DIAGNOSIS — I4821 Permanent atrial fibrillation: Secondary | ICD-10-CM

## 2021-03-28 NOTE — Patient Instructions (Signed)
Medication Instructions:  Your physician recommends that you continue on your current medications as directed. Please refer to the Current Medication list given to you today.  *If you need a refill on your cardiac medications before your next appointment, please call your pharmacy*   Lab Work: None If you have labs (blood work) drawn today and your tests are completely normal, you will receive your results only by: Marland Kitchen MyChart Message (if you have MyChart) OR . A paper copy in the mail If you have any lab test that is abnormal or we need to change your treatment, we will call you to review the results.   Testing/Procedures: None   Follow-Up: At [facility], you and your health needs are our priority.  As part of our continuing mission to provide you with exceptional heart care, we have created designated Provider Care Teams.  These Care Teams include your primary Cardiologist (physician) and Advanced Practice Providers (APPs -  Physician Assistants and Nurse Practitioners) who all work together to provide you with the care you need, when you need it.  We recommend signing up for the patient portal called "MyChart".  Sign up information is provided on this After Visit Summary.  MyChart is used to connect with patients for Virtual Visits (Telemedicine).  Patients are able to view lab/test results, encounter notes, upcoming appointments, etc.  Non-urgent messages can be sent to your provider as well.   To learn more about what you can do with MyChart, go to [email or web address].    Your next appointment:   4-8 week(s)  The format for your next appointment:   In Person  Provider:   You may see Sinclair Grooms, MD or one of the following Advanced Practice Providers on your designated Care Team:    Kathyrn Drown, NP    Other Instructions

## 2021-04-23 ENCOUNTER — Ambulatory Visit: Payer: Medicare Other | Admitting: Podiatry

## 2021-04-23 ENCOUNTER — Encounter: Payer: Self-pay | Admitting: Podiatry

## 2021-04-23 ENCOUNTER — Other Ambulatory Visit: Payer: Self-pay

## 2021-04-23 DIAGNOSIS — B351 Tinea unguium: Secondary | ICD-10-CM | POA: Diagnosis not present

## 2021-04-23 DIAGNOSIS — M79675 Pain in left toe(s): Secondary | ICD-10-CM

## 2021-04-23 DIAGNOSIS — M79674 Pain in right toe(s): Secondary | ICD-10-CM

## 2021-04-28 NOTE — Progress Notes (Signed)
  Subjective:  Patient ID: Jasmine [name], female    DOB: May 13, 1934,  MRN: [id number]  Jasmine [name] presents to clinic today for at risk foot care. Patient has h/o CKD stage 3 and painful thick toenails that are difficult to trim. Pain interferes with ambulation. Aggravating factors include wearing enclosed shoe gear. Pain is relieved with periodic professional debridement..  Allergies  Allergen Reactions  . Norvasc [Amlodipine Besylate]     Pt unsure   . Sulfa Antibiotics     Dizziness   . Codeine Rash  . Latex Rash    Review of Systems: Negative except as noted in the HPI. Objective:   Constitutional Jasmine [name] is a pleasant 87 y.o. African American female, in NAD. AAO x 3.   Vascular Capillary fill time to digits <3 seconds b/l lower extremities. Palpable pedal pulses b/l LE. Pedal hair absent. Lower extremity skin temperature gradient within normal limits. No pain with calf compression b/l. Trace edema noted b/l lower extremities. Evidence of chronic venous insufficiency b/l lower extremities. No cyanosis or clubbing noted.  Neurologic Normal speech. Oriented to person, place, and time. Protective sensation intact 5/5 intact bilaterally with 10g monofilament b/l. Clonus negative b/l.  Dermatologic Pedal skin with normal turgor, texture and tone bilaterally. No open wounds bilaterally. No interdigital macerations bilaterally. Toenails 1-5 b/l elongated, discolored, dystrophic, thickened, crumbly with subungual debris and tenderness to dorsal palpation.  Orthopedic: Normal muscle strength 5/5 to all lower extremity muscle groups bilaterally. No pain crepitus or joint limitation noted with ROM b/l. No gross bony deformities bilaterally. Hammertoe(s) noted to the R 2nd toe.   Radiographs: None Assessment:   1. Pain due to onychomycosis of toenails of both feet    Plan:  Patient was evaluated and treated and all questions answered.  Onychomycosis with pain -Nails  palliatively debridement as below -Educated on self-care  Procedure: Nail Debridement Rationale: Pain Type of Debridement: manual, sharp debridement. Instrumentation: Nail nipper, rotary burr. Number of Nails: 10 -Examined patient. -No new findings. No new orders. -Patient to continue soft, supportive shoe gear daily. -Toenails 1-5 b/l were debrided in length and girth with sterile nail nippers and dremel without iatrogenic bleeding.  -Patient to report any pedal injuries to medical professional immediately. -Patient/POA to call should there be question/concern in the interim.  Return in about 3 months (around 07/24/2021).  Marzetta Board, DPM

## 2021-05-02 ENCOUNTER — Ambulatory Visit: Payer: Medicare Other | Admitting: Interventional Cardiology

## 2021-05-02 ENCOUNTER — Encounter: Payer: Self-pay | Admitting: Interventional Cardiology

## 2021-05-02 ENCOUNTER — Other Ambulatory Visit: Payer: Self-pay

## 2021-05-02 VITALS — BP 142/80 | HR 84 | Ht 64.0 in | Wt 140.0 lb

## 2021-05-02 DIAGNOSIS — I071 Rheumatic tricuspid insufficiency: Secondary | ICD-10-CM | POA: Diagnosis not present

## 2021-05-02 DIAGNOSIS — I4821 Permanent atrial fibrillation: Secondary | ICD-10-CM | POA: Diagnosis not present

## 2021-05-02 DIAGNOSIS — I5032 Chronic diastolic (congestive) heart failure: Secondary | ICD-10-CM | POA: Diagnosis not present

## 2021-05-02 DIAGNOSIS — I50810 Right heart failure, unspecified: Secondary | ICD-10-CM

## 2021-05-02 DIAGNOSIS — I10 Essential (primary) hypertension: Secondary | ICD-10-CM

## 2021-05-02 DIAGNOSIS — N1831 Chronic kidney disease, stage 3a: Secondary | ICD-10-CM

## 2021-05-02 DIAGNOSIS — Z7901 Long term (current) use of anticoagulants: Secondary | ICD-10-CM

## 2021-05-02 NOTE — Patient Instructions (Signed)
Medication Instructions:  Your physician recommends that you continue on your current medications as directed. Please refer to the Current Medication list given to you today.  *If you need a refill on your cardiac medications before your next appointment, please call your pharmacy*   Lab Work: BMET and CBC in July   If you have labs (blood work) drawn today and your tests are completely normal, you will receive your results only by: Marland Kitchen MyChart Message (if you have MyChart) OR . A paper copy in the mail If you have any lab test that is abnormal or we need to change your treatment, we will call you to review the results.   Testing/Procedures: None   Follow-Up: At [facility], you and your health needs are our priority.  As part of our continuing mission to provide you with exceptional heart care, we have created designated Provider Care Teams.  These Care Teams include your primary Cardiologist (physician) and Advanced Practice Providers (APPs -  Physician Assistants and Nurse Practitioners) who all work together to provide you with the care you need, when you need it.  We recommend signing up for the patient portal called "MyChart".  Sign up information is provided on this After Visit Summary.  MyChart is used to connect with patients for Virtual Visits (Telemedicine).  Patients are able to view lab/test results, encounter notes, upcoming appointments, etc.  Non-urgent messages can be sent to your provider as well.   To learn more about what you can do with MyChart, go to [email or web address].    Your next appointment:   3 month(s)  The format for your next appointment:   In Person  Provider:   You may see Sinclair Grooms, MD or one of the following Advanced Practice Providers on your designated Care Team:    Kathyrn Drown, NP    Other Instructions

## 2021-05-02 NOTE — Progress Notes (Signed)
Cardiology Office Note:    Date:  05/02/2021   ID:  Jasmine [name], DOB [birth year], MRN [id number]  PCP:  [name], MD  Cardiologist:  Sinclair Grooms, MD   Referring MD: [name], MD   Chief Complaint  Patient presents with  . Atrial Fibrillation  . Congestive Heart Failure  . Follow-up    CKD    History of Present Illness:    Jasmine [name] is a 87 y.o. female with a hx of  chronicatrial fibrillation,CHADSVASC>2,diastolic heart failure,CKD4, TIA,and hypertension.Twin sister has PAF and CDHF.   Jasmine [name] feels better.  She can ambulate without significant dyspnea.  She denies orthopnea.  Lower extremity swelling has now significantly decreased.  Her appetite is improved.  She denies chest pain.  No blood in the urine or stool.  Her medication regimen is relatively improved.  Past Medical History:  Diagnosis Date  . Allergic rhinitis due to pollen   . Atrial fibrillation ([place]) 12/23/2011  . Atrial fibrillation with rapid ventricular response ([place])   . Chronic diastolic HF (heart failure) ([place]) 11/21/2012  . CKD (chronic kidney disease) stage 3, GFR 30-59 ml/min ([place]) 04/19/2019  . Congestive heart failure, unspecified    patient states she is not aware of this  . Deep venous thrombosis of lower extremity ([place]) 06/02/2012  . Deviated septum 01/11/2020  . Dizziness   . DVT (deep venous thrombosis) ([facility]) left leg  . Epistaxis 01/11/2020  . Essential hypertension, malignant   . GERD (gastroesophageal reflux disease)   . Heart murmur   . Insulin resistance    History of insulin resistance.  . Insulin resistance   . Long term current use of anticoagulant therapy 02/08/2014   coumadin   . Obesity   . Pure hypercholesterolemia   . TIA (transient ischemic attack) 10/02/2018  . Vertigo     Past Surgical History:  Procedure Laterality Date  . APPENDECTOMY    . BREAST BIOPSY    . CHOLECYSTECTOMY    . PARTIAL HYSTERECTOMY    . TEE WITHOUT CARDIOVERSION N/A  03/12/2021   Procedure: TRANSESOPHAGEAL ECHOCARDIOGRAM (TEE);  Surgeon: Werner Lean, MD;  Location: [facility] ENDOSCOPY;  Service: Cardiovascular;  Laterality: N/A;    Current Medications: Current Meds  Medication Sig  . atenolol (TENORMIN) 100 MG tablet Take 100 mg by mouth daily.  Marland Kitchen ELIQUIS 2.5 MG TABS tablet Take 2.5 mg by mouth 2 (two) times daily.  Arna Medici 25 MCG tablet Take 25 mcg by mouth daily before breakfast.  . furosemide (LASIX) 80 MG tablet Take 80 mg by mouth.  . irbesartan (AVAPRO) 75 MG tablet Take 1 tablet by mouth once daily (Patient taking differently: Take 75 mg by mouth daily.)     Allergies:   Norvasc [amlodipine besylate], Sulfa antibiotics, Codeine, and Latex   Social History   Socioeconomic History  . Marital status: Single    Spouse name: Not on file  . Number of children: Not on file  . Years of education: Not on file  . Highest education level: Not on file  Occupational History  . Not on file  Tobacco Use  . Smoking status: Never Smoker  . Smokeless tobacco: Never Used  Substance and Sexual Activity  . Alcohol use: No  . Drug use: No  . Sexual activity: Not Currently    Birth control/protection: Post-menopausal  Other Topics Concern  . Not on file  Social History Narrative  . Not on file  Social Determinants of Health   Financial Resource Strain: Not on file  Food Insecurity: Not on file  Transportation Needs: Not on file  Physical Activity: Not on file  Stress: Not on file  Social Connections: Not on file     Family History: The patient's family history includes Diabetes in her mother; Hypertension in her sister; Pulmonary embolism in her sister.  ROS:   Please see the history of present illness.    She is happy with the way she is doing.  Able to lie down.  She can walk further without giving out.  All other systems reviewed and are negative.  EKGs/Labs/Other Studies Reviewed:    The following studies were reviewed  today: No new or recent imaging.  EKG:  EKG  is not repeated  Recent Labs: 07/05/2020: NT-Pro BNP 3,046 02/20/2021: B Natriuretic Peptide 1,037.1 03/02/2021: BUN 44; Creatinine, Ser 1.66; Hemoglobin 10.5; Platelets 229; Potassium 4.3; Sodium 139  Recent Lipid Panel    Component Value Date/Time   CHOL 95 10/03/2018 0521   TRIG 45 10/03/2018 0521   HDL 26 (L) 10/03/2018 0521   CHOLHDL 3.7 10/03/2018 0521   VLDL 9 10/03/2018 0521   LDLCALC 60 10/03/2018 0521    Physical Exam:    VS:  BP (!) 142/80   Pulse 84   Ht '5\' 4"'$  (1.626 m)   Wt 140 lb (63.5 kg)   SpO2 100%   BMI 24.03 kg/m     Wt Readings from Last 3 Encounters:  05/02/21 140 lb (63.5 kg)  03/28/21 137 lb 4 oz (62.3 kg)  02/22/21 148 lb (67.1 kg)     GEN: Normal weight.  She is here with her twin sister in [place] and [place]. No acute distress HEENT: Normal NECK: No JVD with the patient sitting at 90 degrees.  This is a marked improvement compared to prior. LYMPHATICS: No lymphadenopathy CARDIAC: 2/6 holosystolic left parasternal systolic murmur. IIRR no gallop, but with 2+ bilateral ankle edema. VASCULAR:  Normal Pulses. No bruits. RESPIRATORY:  Clear to auscultation without rales, wheezing or rhonchi  ABDOMEN: Soft, non-tender, non-distended, No pulsatile mass, MUSCULOSKELETAL: No deformity  SKIN: Warm and dry NEUROLOGIC:  Alert and oriented x 3 PSYCHIATRIC:  Normal affect   ASSESSMENT:    1. Chronic diastolic HF (heart failure) ([place])   2. Severe tricuspid regurgitation   3. Right heart failure with reduced right ventricular function ([place])   4. Permanent atrial fibrillation ([place])   5. Long term current use of anticoagulant therapy   6. Essential hypertension   7. Stage 3a chronic kidney disease ([place])    PLAN:    In order of problems listed above:  1. Much improved diastolic heart failure with current more aggressive diuretic regimen.  She has being followed closely by Dr. Royce Macadamia.  Currently  on furosemide 80 mg/day plus Zaroxolyn once per week.  We will do a basic metabolic panel in July. 2. Neck veins are much flatter.  Diuresis is helped decongest the right side of the heart. 3. Clinically improved 4. Excellent rate control 5. Continue with Eliquis 6. Blood pressure is appropriate for age 48 and 140/80. 7. Last creatinine was 1.81 March 28, 2021.  Potassium was 4.3.  This is being followed by Dr[facility] Royce Macadamia at [facility].   Medication Adjustments/Labs and Tests Ordered: Current medicines are reviewed at length with the patient today.  Concerns regarding medicines are outlined above.  Orders Placed This Encounter  Procedures  .  CBC  . Basic metabolic panel   No orders of the defined types were placed in this encounter.   There are no Patient Instructions on file for this visit.   Signed, Sinclair Grooms, MD  05/02/2021 4:15 PM    [facility]

## 2021-05-14 ENCOUNTER — Other Ambulatory Visit: Payer: Self-pay | Admitting: Interventional Cardiology

## 2021-05-14 DIAGNOSIS — I10 Essential (primary) hypertension: Secondary | ICD-10-CM

## 2021-06-27 ENCOUNTER — Encounter (INDEPENDENT_AMBULATORY_CARE_PROVIDER_SITE_OTHER): Payer: Self-pay

## 2021-06-27 ENCOUNTER — Other Ambulatory Visit: Payer: Medicare Other

## 2021-06-27 ENCOUNTER — Other Ambulatory Visit: Payer: Self-pay

## 2021-06-27 DIAGNOSIS — I4821 Permanent atrial fibrillation: Secondary | ICD-10-CM

## 2021-06-27 DIAGNOSIS — I5032 Chronic diastolic (congestive) heart failure: Secondary | ICD-10-CM

## 2021-06-27 LAB — BASIC METABOLIC PANEL
BUN/Creatinine Ratio: 17 (ref 12–28)
BUN: 31 mg/dL — ABNORMAL HIGH (ref 8–27)
CO2: 18 mmol/L — ABNORMAL LOW (ref 20–29)
Calcium: 9.5 mg/dL (ref 8.7–10.3)
Chloride: 103 mmol/L (ref 96–106)
Creatinine, Ser: 1.86 mg/dL — ABNORMAL HIGH (ref 0.57–1.00)
Glucose: 112 mg/dL — ABNORMAL HIGH (ref 65–99)
Potassium: 3.9 mmol/L (ref 3.5–5.2)
Sodium: 140 mmol/L (ref 134–144)
eGFR: 26 mL/min/{1.73_m2} — ABNORMAL LOW (ref >59)

## 2021-06-27 LAB — CBC
Hematocrit: 37.6 % (ref 34.0–46.6)
Hemoglobin: 12 g/dL (ref 11.1–15.9)
MCH: 31.4 pg (ref 26.6–33.0)
MCHC: 31.9 g/dL (ref 31.5–35.7)
MCV: 98 fL — ABNORMAL HIGH (ref 79–97)
Platelets: 110 10*3/uL — ABNORMAL LOW (ref 150–450)
RBC: 3.82 x10E6/uL (ref 3.77–5.28)
RDW: 15.2 % (ref 11.7–15.4)
WBC: 6.4 10*3/uL (ref 3.4–10.8)

## 2021-07-17 NOTE — Progress Notes (Signed)
Cardiology Office Note:    Date:  07/18/2021   ID:  Jasmine [name], DOB 1934-10-29, MRN [id number]  PCP:  [name], MD  Cardiologist:  Sinclair Grooms, MD   Referring MD: [name], MD   No chief complaint on file.   History of Present Illness:    Jasmine [name] is a 87 y.o. female with a hx of chronic atrial  fibrillation, CHADS VASC > 2, diastolic heart failure, CKD 4, TIA, and hypertension. Twin sister has PAF and chronic DHF.   Jasmine [name] feels that she is stable.  She saw Dr. Royce Macadamia on Monday and metolazone was discontinued.  I am unable to see any recent blood work.  She is not having significant swelling.  She denies shortness of breath.  Appetite is improved.  No orthopnea PND is noted.  She is accompanied by her sister.  Past Medical History:  Diagnosis Date   Allergic rhinitis due to pollen    Atrial fibrillation ([place]) 12/23/2011   Atrial fibrillation with rapid ventricular response (HCC)    Chronic diastolic HF (heart failure) ([place]) 11/21/2012   CKD (chronic kidney disease) stage 3, GFR 30-59 ml/min ([place]) 04/19/2019   Congestive heart failure, unspecified    patient states she is not aware of this   Deep venous thrombosis of lower extremity ([place]) 06/02/2012   Deviated septum 01/11/2020   Dizziness    DVT (deep venous thrombosis) (HCC) left leg   Epistaxis 01/11/2020   Essential hypertension, malignant    GERD (gastroesophageal reflux disease)    Heart murmur    Insulin resistance    History of insulin resistance.   Insulin resistance    Long term current use of anticoagulant therapy 02/08/2014   coumadin    Obesity    Pure hypercholesterolemia    TIA (transient ischemic attack) 10/02/2018   Vertigo     Past Surgical History:  Procedure Laterality Date   APPENDECTOMY     BREAST BIOPSY     CHOLECYSTECTOMY     PARTIAL HYSTERECTOMY     TEE WITHOUT CARDIOVERSION N/A 03/12/2021   Procedure: TRANSESOPHAGEAL ECHOCARDIOGRAM (TEE);  Surgeon: Werner Lean, MD;  Location: [facility] ENDOSCOPY;  Service: Cardiovascular;  Laterality: N/A;    Current Medications: Current Meds  Medication Sig   atenolol (TENORMIN) 100 MG tablet Take 100 mg by mouth daily.   ELIQUIS 2.5 MG TABS tablet Take 2.5 mg by mouth 2 (two) times daily.   EUTHYROX 25 MCG tablet Take 25 mcg by mouth daily before breakfast.   furosemide (LASIX) 80 MG tablet Take 80 mg by mouth.   irbesartan (AVAPRO) 75 MG tablet Take 1 tablet by mouth once daily     Allergies:   Norvasc [amlodipine besylate], Sulfa antibiotics, Codeine, and Latex   Social History   Socioeconomic History   Marital status: Single    Spouse name: Not on file   Number of children: Not on file   Years of education: Not on file   Highest education level: Not on file  Occupational History   Not on file  Tobacco Use   Smoking status: Never   Smokeless tobacco: Never  Substance and Sexual Activity   Alcohol use: No   Drug use: No   Sexual activity: Not Currently    Birth control/protection: Post-menopausal  Other Topics Concern   Not on file  Social History Narrative   Not on file   Social Determinants of Health   Financial Resource  Strain: Not on file  Food Insecurity: Not on file  Transportation Needs: Not on file  Physical Activity: Not on file  Stress: Not on file  Social Connections: Not on file     Family History: The patient's family history includes Diabetes in her mother; Hypertension in her sister; Pulmonary embolism in her sister.  ROS:   Please see the history of present illness.    Happy with her current state.  All other systems reviewed and are negative.  EKGs/Labs/Other Studies Reviewed:    The following studies were reviewed today: No recent imaging  EKG:  EKG not repeated today  Recent Labs: 02/20/2021: B Natriuretic Peptide 1,037.1 06/27/2021: BUN 31; Creatinine, Ser 1.86; Hemoglobin 12.0; Platelets 110; Potassium 3.9; Sodium 140  Recent Lipid Panel     Component Value Date/Time   CHOL 95 10/03/2018 0521   TRIG 45 10/03/2018 0521   HDL 26 (L) 10/03/2018 0521   CHOLHDL 3.7 10/03/2018 0521   VLDL 9 10/03/2018 0521   LDLCALC 60 10/03/2018 0521    Physical Exam:    VS:  BP 116/78   Pulse 79   Ht '5\' 4"'$  (1.626 m)   Wt 139 lb 9.6 oz (63.3 kg)   SpO2 99%   BMI 23.96 kg/m     Wt Readings from Last 3 Encounters:  07/18/21 139 lb 9.6 oz (63.3 kg)  05/02/21 140 lb (63.5 kg)  03/28/21 137 lb 4 oz (62.3 kg)     GEN: Compatible with age. No acute distress HEENT: Normal NECK: No JVD. LYMPHATICS: No lymphadenopathy CARDIAC: 3/6 left lower sternal systolic murmur. RRR no gallop, there is bilateral lower extremity 2+ ankle edema. VASCULAR:  Normal Pulses. No bruits. RESPIRATORY:  Clear to auscultation without rales, wheezing or rhonchi  ABDOMEN: Soft, non-tender, non-distended, No pulsatile mass, MUSCULOSKELETAL: No deformity  SKIN: Warm and dry NEUROLOGIC:  Alert and oriented x 3 PSYCHIATRIC:  Normal affect   ASSESSMENT:    1. Chronic diastolic HF (heart failure) ([place])   2. Severe tricuspid regurgitation   3. Right heart failure with reduced right ventricular function ([place])   4. Permanent atrial fibrillation ([place])   5. Long term current use of anticoagulant therapy   6. Essential hypertension   7. Stage 3a chronic kidney disease ([place])   8. Nonrheumatic mitral valve regurgitation    PLAN:    In order of problems listed above:  Compensated diastolic heart failure without evidence of significant volume overload. Unchanged based upon clinical exam. Improved with diuresis Good rate control No obvious bleeding on Eliquis 2.5 mg twice daily.  Reduced dose because of CKD. Blood pressure is relatively low.  May be able to cut back on Avapro at some point.  Will attempt to see labs done earlier this week at nephrology. Mitral regurgitation was not as severe as we thought.   Medication Adjustments/Labs and Tests Ordered: Current  medicines are reviewed at length with the patient today.  Concerns regarding medicines are outlined above.  No orders of the defined types were placed in this encounter.  No orders of the defined types were placed in this encounter.   Patient Instructions  Medication Instructions:  Your physician recommends that you continue on your current medications as directed. Please refer to the Current Medication list given to you today.  *If you need a refill on your cardiac medications before your next appointment, please call your pharmacy*   Lab Work: None If you have labs (blood work) drawn today and your  tests are completely normal, you will receive your results only by: MyChart Message (if you have MyChart) OR A paper copy in the mail If you have any lab test that is abnormal or we need to change your treatment, we will call you to review the results.   Testing/Procedures: None   Follow-Up: At [facility], you and your health needs are our priority.  As part of our continuing mission to provide you with exceptional heart care, we have created designated Provider Care Teams.  These Care Teams include your primary Cardiologist (physician) and Advanced Practice Providers (APPs -  Physician Assistants and Nurse Practitioners) who all work together to provide you with the care you need, when you need it.  We recommend signing up for the patient portal called "MyChart".  Sign up information is provided on this After Visit Summary.  MyChart is used to connect with patients for Virtual Visits (Telemedicine).  Patients are able to view lab/test results, encounter notes, upcoming appointments, etc.  Non-urgent messages can be sent to your provider as well.   To learn more about what you can do with MyChart, go to [email or web address].    Your next appointment:   4 month(s)  The format for your next appointment:   In Person  Provider:   You may see Sinclair Grooms, MD or one of the  following Advanced Practice Providers on your designated Care Team:   Cecilie Kicks, NP    Other Instructions     Signed, Sinclair Grooms, MD  07/18/2021 3:15 PM    [place]

## 2021-07-18 ENCOUNTER — Encounter: Payer: Self-pay | Admitting: Interventional Cardiology

## 2021-07-18 ENCOUNTER — Other Ambulatory Visit: Payer: Self-pay

## 2021-07-18 ENCOUNTER — Ambulatory Visit: Payer: Medicare Other | Admitting: Interventional Cardiology

## 2021-07-18 VITALS — BP 116/78 | HR 79 | Ht 64.0 in | Wt 139.6 lb

## 2021-07-18 DIAGNOSIS — Z7901 Long term (current) use of anticoagulants: Secondary | ICD-10-CM

## 2021-07-18 DIAGNOSIS — I5032 Chronic diastolic (congestive) heart failure: Secondary | ICD-10-CM

## 2021-07-18 DIAGNOSIS — I50810 Right heart failure, unspecified: Secondary | ICD-10-CM | POA: Diagnosis not present

## 2021-07-18 DIAGNOSIS — N1831 Chronic kidney disease, stage 3a: Secondary | ICD-10-CM

## 2021-07-18 DIAGNOSIS — I071 Rheumatic tricuspid insufficiency: Secondary | ICD-10-CM

## 2021-07-18 DIAGNOSIS — I10 Essential (primary) hypertension: Secondary | ICD-10-CM

## 2021-07-18 DIAGNOSIS — I4821 Permanent atrial fibrillation: Secondary | ICD-10-CM

## 2021-07-18 DIAGNOSIS — I340 Nonrheumatic mitral (valve) insufficiency: Secondary | ICD-10-CM

## 2021-07-18 NOTE — Patient Instructions (Signed)
Medication Instructions:  Your physician recommends that you continue on your current medications as directed. Please refer to the Current Medication list given to you today.  *If you need a refill on your cardiac medications before your next appointment, please call your pharmacy*   Lab Work: None If you have labs (blood work) drawn today and your tests are completely normal, you will receive your results only by: [place] (if you have MyChart) OR A paper copy in the mail If you have any lab test that is abnormal or we need to change your treatment, we will call you to review the results.   Testing/Procedures: None   Follow-Up: At [facility], you and your health needs are our priority.  As part of our continuing mission to provide you with exceptional heart care, we have created designated Provider Care Teams.  These Care Teams include your primary Cardiologist (physician) and Advanced Practice Providers (APPs -  Physician Assistants and Nurse Practitioners) who all work together to provide you with the care you need, when you need it.  We recommend signing up for the patient portal called "MyChart".  Sign up information is provided on this After Visit Summary.  MyChart is used to connect with patients for Virtual Visits (Telemedicine).  Patients are able to view lab/test results, encounter notes, upcoming appointments, etc.  Non-urgent messages can be sent to your provider as well.   To learn more about what you can do with MyChart, go to [email or web address].    Your next appointment:   4 month(s)  The format for your next appointment:   In Person  Provider:   You may see Sinclair Grooms, MD or one of the following Advanced Practice Providers on your designated Care Team:   Cecilie Kicks, NP    Other Instructions

## 2021-07-30 ENCOUNTER — Other Ambulatory Visit: Payer: Self-pay

## 2021-07-30 ENCOUNTER — Encounter: Payer: Self-pay | Admitting: Podiatry

## 2021-07-30 ENCOUNTER — Ambulatory Visit: Payer: Medicare Other | Admitting: Podiatry

## 2021-07-30 DIAGNOSIS — M79674 Pain in right toe(s): Secondary | ICD-10-CM

## 2021-07-30 DIAGNOSIS — M79675 Pain in left toe(s): Secondary | ICD-10-CM | POA: Diagnosis not present

## 2021-07-30 DIAGNOSIS — B351 Tinea unguium: Secondary | ICD-10-CM

## 2021-08-04 NOTE — Progress Notes (Signed)
Subjective: Jasmine [name] is a 87 y.o. female patient seen today for at risk foot care with h/o clotting disorder. She is on Eliquis daily. She is seen for follow up of  painful thick toenails that are difficult to trim. Pain interferes with ambulation. Aggravating factors include wearing enclosed shoe gear. Pain is relieved with periodic professional debridement.  New problems reported today: None.  PCP is Dr. Kevan Ny. Last visit was 3 months ago.  Allergies  Allergen Reactions   Lovenox [Enoxaparin]    Norvasc [Amlodipine Besylate]     Pt unsure    Sulfa Antibiotics     Dizziness    Codeine Rash   Latex Rash    PCP is [name], MD .  Objective: Physical Exam  General: Patient is a pleasant 87 y.o. African American female WD, WN in NAD. AAO x 3.   Neurovascular Examination: Capillary refill time to digits <3 seconds b/l.  Palpable pedal pulses b/l LE. Pedal hair absent b/l. Lower extremity skin temperature gradient within normal limits. No pain with calf compression b/l. No edema noted b/l lower extremities. +Varicosities b/l LE. Trace edema b/l LE.  Protective sensation intact 5/5 intact bilaterally with 10g monofilament b/l. Vibratory sensation intact b/l.  Dermatological:  Skin warm and supple b/l lower extremities. No open wounds b/l lower extremities. No interdigital macerations b/l lower extremities. Toenails 1-5 b/l elongated, discolored, dystrophic, thickened, crumbly with subungual debris and tenderness to dorsal palpation.  Musculoskeletal:  Normal muscle strength 5/5 to all lower extremity muscle groups bilaterally. No pain crepitus or joint limitation noted with ROM b/l lower extremities. Hammertoe right 2nd digit.  Assessment: 1. Pain due to onychomycosis of toenails of both feet    Plan:  -Examined patient. -Toenails 1-5 b/l were debrided in length and girth with sterile nail nippers and dremel without iatrogenic bleeding.  -Patient to report any  pedal injuries to medical professional immediately. -Patient/POA to call should there be question/concern in the interim.  Return in about 3 months (around 10/30/2021).  Marzetta Board, DPM

## 2021-11-06 ENCOUNTER — Ambulatory Visit: Payer: Medicare Other | Admitting: Podiatry

## 2021-11-06 ENCOUNTER — Ambulatory Visit (INDEPENDENT_AMBULATORY_CARE_PROVIDER_SITE_OTHER): Payer: Medicare Other

## 2021-11-06 ENCOUNTER — Encounter: Payer: Self-pay | Admitting: Podiatry

## 2021-11-06 ENCOUNTER — Other Ambulatory Visit: Payer: Self-pay

## 2021-11-06 DIAGNOSIS — M79675 Pain in left toe(s): Secondary | ICD-10-CM | POA: Diagnosis not present

## 2021-11-06 DIAGNOSIS — M19071 Primary osteoarthritis, right ankle and foot: Secondary | ICD-10-CM

## 2021-11-06 DIAGNOSIS — M25579 Pain in unspecified ankle and joints of unspecified foot: Secondary | ICD-10-CM

## 2021-11-06 DIAGNOSIS — M79674 Pain in right toe(s): Secondary | ICD-10-CM | POA: Diagnosis not present

## 2021-11-06 DIAGNOSIS — M79671 Pain in right foot: Secondary | ICD-10-CM | POA: Diagnosis not present

## 2021-11-06 DIAGNOSIS — B351 Tinea unguium: Secondary | ICD-10-CM | POA: Diagnosis not present

## 2021-11-07 ENCOUNTER — Encounter: Payer: Self-pay | Admitting: Podiatry

## 2021-11-07 NOTE — Progress Notes (Signed)
  Subjective:  Patient ID: Jasmine [name], female    DOB: [birth year],  MRN: [id number]  Jasmine [name] presents to clinic today for at risk foot care with h/o clotting disorder and painful elongated mycotic toenails 1-5 bilaterally which are tender when wearing enclosed shoe gear. Pain is relieved with periodic professional debridement.  Patient has new concern of painful right ankle. She denies any preceding episode of trauma. She states she fractured her right ankle years ago. She states she had pin insertion to reduce her fracture. She denies any redness or swelling of ankle.  PCP is [name], MD , and last visit was March, 2022.  Allergies  Allergen Reactions   Lovenox [Enoxaparin]    Norvasc [Amlodipine Besylate]     Pt unsure    Sulfa Antibiotics     Dizziness    Codeine Rash   Latex Rash    Review of Systems: Negative except as noted in the HPI. Objective:   Constitutional Jasmine [name] is a pleasant 87 y.o. African American female, WD, WN in NAD. AAO x 3.   Vascular CFT <3 seconds b/l LE. Palpable DP pulse(s) b/l LE. Palpable PT pulse(s) b/l LE. Pedal hair absent. Nonpitting edema noted b/l lower extremities. Varicosities present b/l. No cyanosis or clubbing noted b/l LE.  Neurologic Normal speech. Oriented to person, place, and time. Protective sensation intact 5/5 intact bilaterally with 10g monofilament b/l. Vibratory sensation intact b/l.  Dermatologic Pedal integument with normal turgor, texture and tone BLE. No open wounds b/l LE. No interdigital macerations noted b/l LE. Toenails 1-5 b/l elongated, discolored, dystrophic, thickened, crumbly with subungual debris and tenderness to dorsal palpation. She is getting pressure areas submet head 1 b/l. No breaks in skin. No warmth, no blistering, no fluctuance.  Orthopedic: Muscle strength 5/5 to all lower extremity muscle groups bilaterally. Pain on dorsiflexion/plantarflextion of ankle..Limited ROM at ankle joint    Xray findings right ankle: No gas in tissues right ankle. Osteopenia noted distal tibia and distal fibula. Soft tissue swelling present right lower extremity. No foreign body evident right ankle. Decreased joint space noted anterior tibiotalar joint with flattening of talar dome.   Assessment:   1. Pain due to onychomycosis of toenails of both feet   2. Ankle pain, unspecified chronicity, unspecified laterality   3. DJD (degenerative joint disease), ankle and foot, right    Plan:  Patient was evaluated and treated and all questions answered. Consent given for treatment as described below: -Examined patient. -Continue soft, supportive shoe gear daily. -For pressure areas submet head 1 b/l, offloaded with felt metatarsal pads placed in shoes. -Mycotic toenails 1-5 bilaterally were debrided in length and girth with sterile nail nippers and dremel without incident. -Xray of right ankle was performed and reviewed with patient and/or POA. Discussed DJD of ankle. Advised I can refer her to one of the ankle surgeons here for evaluation or she can see her previous surgeon for her joint pain. She will think about it and let us know. -Patient/POA to call should there be question/concern in the interim.  Return in about 3 months (around 02/05/2022).  Marzetta Board, DPM

## 2021-12-13 ENCOUNTER — Ambulatory Visit: Payer: Medicare Other | Admitting: Interventional Cardiology

## 2022-02-11 ENCOUNTER — Encounter: Payer: Self-pay | Admitting: Podiatry

## 2022-02-11 ENCOUNTER — Ambulatory Visit: Payer: Medicare Other | Admitting: Podiatry

## 2022-02-11 ENCOUNTER — Other Ambulatory Visit: Payer: Self-pay

## 2022-02-11 DIAGNOSIS — M79674 Pain in right toe(s): Secondary | ICD-10-CM

## 2022-02-11 DIAGNOSIS — M79675 Pain in left toe(s): Secondary | ICD-10-CM

## 2022-02-11 DIAGNOSIS — B351 Tinea unguium: Secondary | ICD-10-CM | POA: Diagnosis not present

## 2022-02-11 DIAGNOSIS — M7751 Other enthesopathy of right foot: Secondary | ICD-10-CM

## 2022-02-11 MED ORDER — TRIAMCINOLONE ACETONIDE 10 MG/ML IJ SUSP
10.0000 mg | Freq: Once | INTRAMUSCULAR | Status: AC
  Administered 2022-02-11: 10 mg

## 2022-02-11 NOTE — Progress Notes (Signed)
Cardiology Office Note:    Date:  02/12/2022   ID:  [name], DOB [birth year], MRN [id number]  PCP:  [name], MD  Cardiologist:  Sinclair Grooms, MD   Referring MD: [name], MD   Chief Complaint  Patient presents with   Atrial Fibrillation   Congestive Heart Failure    History of Present Illness:    Jasmine [name] is a 88 y.o. female with a hx of  chronic atrial  fibrillation, CHADS VASC > 2, diastolic heart failure, severe tricuspid regurgitation and RV failure, CKD 4, TIA, and hypertension. Twin sister has PAF and chronic DHF.    She is doing okay.  She had some phlebitis in the right lower extremity.  Metolazone dosing as been decreased to twice per month, every other week.  This is because blood pressures have been relatively low.  She denies orthopnea.  She is excepting a slight increase in chronic lower extremity edema.  No chest pain, syncope, or neurological complaints.  No bleeding.  Past Medical History:  Diagnosis Date   Allergic rhinitis due to pollen    Atrial fibrillation ([place]) 12/23/2011   Atrial fibrillation with rapid ventricular response (HCC)    Chronic diastolic HF (heart failure) ([place]) 11/21/2012   CKD (chronic kidney disease) stage 3, GFR 30-59 ml/min ([place]) 04/19/2019   Congestive heart failure, unspecified    patient states she is not aware of this   Deep venous thrombosis of lower extremity ([place]) 06/02/2012   Deviated septum 01/11/2020   Dizziness    DVT (deep venous thrombosis) (HCC) left leg   Epistaxis 01/11/2020   Essential hypertension, malignant    GERD (gastroesophageal reflux disease)    Heart murmur    Insulin resistance    History of insulin resistance.   Insulin resistance    Long term current use of anticoagulant therapy 02/08/2014   coumadin    Obesity    Pure hypercholesterolemia    TIA (transient ischemic attack) 10/02/2018   Vertigo     Past Surgical History:  Procedure Laterality Date   ANKLE FRACTURE SURGERY  Right    APPENDECTOMY     BREAST BIOPSY     CHOLECYSTECTOMY     PARTIAL HYSTERECTOMY     TEE WITHOUT CARDIOVERSION N/A 03/12/2021   Procedure: TRANSESOPHAGEAL ECHOCARDIOGRAM (TEE);  Surgeon: Werner Lean, MD;  Location[facility] ENDOSCOPY;  Service: Cardiovascular;  Laterality: N/A;    Current Medications: Current Meds  Medication Sig   atenolol (TENORMIN) 100 MG tablet Take 100 mg by mouth daily.   cefpodoxime (VANTIN) 200 MG tablet Take 200 mg by mouth daily.   ELIQUIS 2.5 MG TABS tablet Take 2.5 mg by mouth 2 (two) times daily.   EUTHYROX 25 MCG tablet Take 25 mcg by mouth daily before breakfast.   furosemide (LASIX) 80 MG tablet Take 80 mg by mouth.   irbesartan (AVAPRO) 75 MG tablet Take 1 tablet by mouth once daily   metolazone (ZAROXOLYN) 5 MG tablet Take 5 mg by mouth once a week.     Allergies:   Lovenox [enoxaparin], Norvasc [amlodipine besylate], Sulfa antibiotics, Codeine, and Latex   Social History   Socioeconomic History   Marital status: Single    Spouse name: Not on file   Number of children: Not on file   Years of education: Not on file   Highest education level: Not on file  Occupational History   Not on file  Tobacco Use  Smoking status: Never   Smokeless tobacco: Never  Substance and Sexual Activity   Alcohol use: No   Drug use: No   Sexual activity: Not Currently    Birth control/protection: Post-menopausal  Other Topics Concern   Not on file  Social History Narrative   Not on file   Social Determinants of Health   Financial Resource Strain: Not on file  Food Insecurity: Not on file  Transportation Needs: Not on file  Physical Activity: Not on file  Stress: Not on file  Social Connections: Not on file     Family History: The patient's family history includes Diabetes in her mother; Hypertension in her sister; Pulmonary embolism in her sister.  ROS:   Please see the history of present illness.    Somewhat disgruntled because of  the complexity of her medical problems.  Also frustrated by having so many physicians and getting multiple stories concerning her overall condition.  All other systems reviewed and are negative.  EKGs/Labs/Other Studies Reviewed:    The following studies were reviewed today:   TEE 03/12/2021 IMPRESSIONS     1. Left ventricular ejection fraction, by estimation, is 50 to 55%. The  left ventricle has low normal function.   2. Right ventricular systolic function is moderately reduced. The right  ventricular size is mildly enlarged.   3. Left atrial size was severely dilated. No left atrial/left atrial  appendage thrombus was detected.   4. Right atrial size was severely dilated.   5. Moderate pleural effusion.   6. The mitral valve is grossly normal. Mild to moderate mitral valve  regurgitation.   7. Tricuspid valve regurgitation is severe.   8. The aortic valve is tricuspid. There is moderate calcification of the  aortic valve. Aortic valve regurgitation is trivial.   9. There is mild (Grade II) plaque involving the descending aorta.    EKG:  EKG atrial fibrillation, right axis deviation, poor R wave progression, ventricular rate 72 bpm, PVC or Ashman beat noted.  When compared to 02/22/2021, heart rate is slower.  Recent Labs: 02/20/2021: B Natriuretic Peptide 1,037.1 06/27/2021: BUN 31; Creatinine, Ser 1.86; Hemoglobin 12.0; Platelets 110; Potassium 3.9; Sodium 140  Recent Lipid Panel    Component Value Date/Time   CHOL 95 10/03/2018 0521   TRIG 45 10/03/2018 0521   HDL 26 (L) 10/03/2018 0521   CHOLHDL 3.7 10/03/2018 0521   VLDL 9 10/03/2018 0521   LDLCALC 60 10/03/2018 0521    Physical Exam:    VS:  BP 98/62    Pulse 72    Ht 5\' 4"  (1.626 m)    Wt 149 lb 6.4 oz (67.8 kg)    SpO2 98%    BMI 25.64 kg/m     Wt Readings from Last 3 Encounters:  02/12/22 149 lb 6.4 oz (67.8 kg)  07/18/21 139 lb 9.6 oz (63.3 kg)  05/02/21 140 lb (63.5 kg)     GEN: Compatible with age. No  acute distress HEENT: Normal NECK: Moderate JVD with prominent CV wave. LYMPHATICS: No lymphadenopathy CARDIAC: 3/6 to 4/6 holosystolic right lower parasternal and left lower parasternal murmur. IIRR no gallop with bilateral 2+ lower extremity edema. VASCULAR:  Normal Pulses. No bruits. RESPIRATORY:  Clear to auscultation without rales, wheezing or rhonchi  ABDOMEN: Soft, non-tender, non-distended, No pulsatile mass, MUSCULOSKELETAL: No deformity  SKIN: Warm and dry NEUROLOGIC:  Alert and oriented x 3 PSYCHIATRIC:  Normal affect   ASSESSMENT:    1. Chronic diastolic HF (heart  failure) ([place])   2. Severe tricuspid regurgitation   3. Right heart failure with reduced right ventricular function ([place])   4. Permanent atrial fibrillation ([place])   5. Long term current use of anticoagulant therapy   6. Essential hypertension   7. Stage 3a chronic kidney disease ([place])   8. Nonrheumatic mitral valve regurgitation    PLAN:    In order of problems listed above:  Current volume status is adequate given lowish blood pressure slightly less than 237 mmHg systolic.  Continue furosemide daily and metolazone once every other week (5 mg). Severe tricuspid regurgitation is now associated with right heart failure.  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  No specific management strategy exists.  We did not performing edge to edge leaflet repair in [place]. Controlled rate on Tenormin Continue apixaban. May need to consider discontinuing irbesartan. Followed by Dr. 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  Overall plan at her age is symptomatic control of volume.  Prognosis is poor.   Medication Adjustments/Labs and Tests Ordered: Current medicines are reviewed at length with the patient today.   Concerns regarding medicines are outlined above.  No orders of the defined types were placed in this encounter.  No orders of the defined types were placed in this encounter.   There are no Patient Instructions on file for this visit.   Signed, Sinclair Grooms, MD  02/12/2022 10:19 AM    [place]

## 2022-02-12 ENCOUNTER — Ambulatory Visit: Payer: Medicare Other | Admitting: Interventional Cardiology

## 2022-02-12 ENCOUNTER — Encounter: Payer: Self-pay | Admitting: Interventional Cardiology

## 2022-02-12 VITALS — BP 98/62 | HR 72 | Ht 64.0 in | Wt 149.4 lb

## 2022-02-12 DIAGNOSIS — I50810 Right heart failure, unspecified: Secondary | ICD-10-CM | POA: Diagnosis not present

## 2022-02-12 DIAGNOSIS — I4821 Permanent atrial fibrillation: Secondary | ICD-10-CM

## 2022-02-12 DIAGNOSIS — I340 Nonrheumatic mitral (valve) insufficiency: Secondary | ICD-10-CM

## 2022-02-12 DIAGNOSIS — Z7901 Long term (current) use of anticoagulants: Secondary | ICD-10-CM

## 2022-02-12 DIAGNOSIS — N1831 Chronic kidney disease, stage 3a: Secondary | ICD-10-CM

## 2022-02-12 DIAGNOSIS — I071 Rheumatic tricuspid insufficiency: Secondary | ICD-10-CM

## 2022-02-12 DIAGNOSIS — I5032 Chronic diastolic (congestive) heart failure: Secondary | ICD-10-CM | POA: Diagnosis not present

## 2022-02-12 DIAGNOSIS — I10 Essential (primary) hypertension: Secondary | ICD-10-CM

## 2022-02-12 NOTE — Progress Notes (Signed)
Subjective:  ? ?Patient ID: Jasmine [name], female   DOB: 88 y.o.   MRN: [id number]  ? ?HPI ?Patient presents with a lot of pain in her right ankle and thick yellow brittle nailbeds 1-5 both feet that are taken care of every approximate 3 months ? ? ?ROS ? ? ?   ?Objective:  ?Physical Exam  ?Neurovascular status unchanged patient in wheelchair with edema in the ankle region bilateral and thick yellow brittle nailbeds 1-5 both feet that are painful and inflammation pain of the right sinus tarsi ? ?   ?Assessment:  ?Acute sinus tarsitis right with inflammation fluid buildup and thick yellow brittle nailbeds 1-5 both feet painful ? ?   ?Plan:  ?Sterile prep reviewed and went ahead today injected the right sinus tarsi 3 mg Kenalog 5 mg Xylocaine and debrided nailbeds 1-5 both feet no iatrogenic bleeding reappoint routine care as needed per Dr. Adah Perl to take care of her nails as she has done in the past ?   ? ? ?

## 2022-02-12 NOTE — Patient Instructions (Signed)
Medication Instructions:  ?Your physician recommends that you continue on your current medications as directed. Please refer to the Current Medication list given to you today. ? ?*If you need a refill on your cardiac medications before your next appointment, please call your pharmacy* ? ? ?Lab Work: ?None today ?If you have labs (blood work) drawn today and your tests are completely normal, you will receive your results only by: ?MyChart Message (if you have MyChart) OR ?A paper copy in the mail ?If you have any lab test that is abnormal or we need to change your treatment, we will call you to review the results. ? ? ?Testing/Procedures: ?None today ? ? ?Follow-Up: ?At [facility], you and your health needs are our priority.  As part of our continuing mission to provide you with exceptional heart care, we have created designated Provider Care Teams.  These Care Teams include your primary Cardiologist (physician) and Advanced Practice Providers (APPs -  Physician Assistants and Nurse Practitioners) who all work together to provide you with the care you need, when you need it. ? ?We recommend signing up for the patient portal called "MyChart".  Sign up information is provided on this After Visit Summary.  MyChart is used to connect with patients for Virtual Visits (Telemedicine).  Patients are able to view lab/test results, encounter notes, upcoming appointments, etc.  Non-urgent messages can be sent to your provider as well.   ?To learn more about what you can do with MyChart, go to [email or web address].   ? ?Your next appointment:   ?6 month(s) ? ?The format for your next appointment:   ?In Person ? ?Provider:   ?Sinclair Grooms, MD   ? ?  ?

## 2022-03-14 ENCOUNTER — Emergency Department (HOSPITAL_COMMUNITY)
Admission: EM | Admit: 2022-03-14 | Discharge: 2022-03-14 | Disposition: A | Discharge status: Home / Self Care | Payer: Medicare Other | Attending: Emergency Medicine | Admitting: Emergency Medicine

## 2022-03-14 ENCOUNTER — Emergency Department (HOSPITAL_COMMUNITY): Payer: Medicare Other

## 2022-03-14 ENCOUNTER — Encounter (HOSPITAL_COMMUNITY): Payer: Self-pay

## 2022-03-14 DIAGNOSIS — Z79899 Other long term (current) drug therapy: Secondary | ICD-10-CM | POA: Insufficient documentation

## 2022-03-14 DIAGNOSIS — R0602 Shortness of breath: Secondary | ICD-10-CM | POA: Insufficient documentation

## 2022-03-14 DIAGNOSIS — R7989 Other specified abnormal findings of blood chemistry: Secondary | ICD-10-CM | POA: Insufficient documentation

## 2022-03-14 DIAGNOSIS — I130 Hypertensive heart and chronic kidney disease with heart failure and stage 1 through stage 4 chronic kidney disease, or unspecified chronic kidney disease: Secondary | ICD-10-CM | POA: Insufficient documentation

## 2022-03-14 DIAGNOSIS — R2243 Localized swelling, mass and lump, lower limb, bilateral: Secondary | ICD-10-CM | POA: Diagnosis present

## 2022-03-14 DIAGNOSIS — I872 Venous insufficiency (chronic) (peripheral): Secondary | ICD-10-CM | POA: Diagnosis not present

## 2022-03-14 DIAGNOSIS — I4891 Unspecified atrial fibrillation: Secondary | ICD-10-CM | POA: Diagnosis not present

## 2022-03-14 DIAGNOSIS — Z91040 Latex allergy status: Secondary | ICD-10-CM | POA: Diagnosis not present

## 2022-03-14 DIAGNOSIS — I509 Heart failure, unspecified: Secondary | ICD-10-CM | POA: Insufficient documentation

## 2022-03-14 DIAGNOSIS — N183 Chronic kidney disease, stage 3 unspecified: Secondary | ICD-10-CM | POA: Diagnosis not present

## 2022-03-14 DIAGNOSIS — Z7901 Long term (current) use of anticoagulants: Secondary | ICD-10-CM | POA: Insufficient documentation

## 2022-03-14 DIAGNOSIS — R609 Edema, unspecified: Secondary | ICD-10-CM

## 2022-03-14 LAB — CBC WITH DIFFERENTIAL/PLATELET
Abs Immature Granulocytes: 0.02 10*3/uL (ref 0.00–0.07)
Basophils Absolute: 0 10*3/uL (ref 0.0–0.1)
Basophils Relative: 1 %
Eosinophils Absolute: 0 10*3/uL (ref 0.0–0.5)
Eosinophils Relative: 1 %
HCT: 40.8 % (ref 36.0–46.0)
Hemoglobin: 13.3 g/dL (ref 12.0–15.0)
Immature Granulocytes: 1 %
Lymphocytes Relative: 17 %
Lymphs Abs: 0.6 10*3/uL — ABNORMAL LOW (ref 0.7–4.0)
MCH: 33.7 pg (ref 26.0–34.0)
MCHC: 32.6 g/dL (ref 30.0–36.0)
MCV: 103.3 fL — ABNORMAL HIGH (ref 80.0–100.0)
Monocytes Absolute: 0.4 10*3/uL (ref 0.1–1.0)
Monocytes Relative: 10 %
Neutro Abs: 2.6 10*3/uL (ref 1.7–7.7)
Neutrophils Relative %: 70 %
Platelets: 89 10*3/uL — ABNORMAL LOW (ref 150–400)
RBC: 3.95 MIL/uL (ref 3.87–5.11)
RDW: 16.3 % — ABNORMAL HIGH (ref 11.5–15.5)
WBC: 3.6 10*3/uL — ABNORMAL LOW (ref 4.0–10.5)
nRBC: 4.1 % — ABNORMAL HIGH (ref 0.0–0.2)

## 2022-03-14 LAB — BASIC METABOLIC PANEL
Anion gap: 11 (ref 5–15)
BUN: 80 mg/dL — ABNORMAL HIGH (ref 8–23)
CO2: 20 mmol/L — ABNORMAL LOW (ref 22–32)
Calcium: 9.9 mg/dL (ref 8.9–10.3)
Chloride: 112 mmol/L — ABNORMAL HIGH (ref 98–111)
Creatinine, Ser: 2.12 mg/dL — ABNORMAL HIGH (ref 0.44–1.00)
GFR, Estimated: 22 mL/min — ABNORMAL LOW (ref >60)
Glucose, Bld: 146 mg/dL — ABNORMAL HIGH (ref 70–99)
Potassium: 3.9 mmol/L (ref 3.5–5.1)
Sodium: 143 mmol/L (ref 135–145)

## 2022-03-14 LAB — TROPONIN I (HIGH SENSITIVITY)
Troponin I (High Sensitivity): 29 ng/L — ABNORMAL HIGH (ref <18)
Troponin I (High Sensitivity): 27 ng/L — ABNORMAL HIGH (ref <18)

## 2022-03-14 LAB — BRAIN NATRIURETIC PEPTIDE: B Natriuretic Peptide: 1424 pg/mL — ABNORMAL HIGH (ref 0.0–100.0)

## 2022-03-14 MED ORDER — FUROSEMIDE 10 MG/ML IJ SOLN
80.0000 mg | Freq: Once | INTRAMUSCULAR | Status: AC
  Administered 2022-03-14: 80 mg via INTRAVENOUS
  Filled 2022-03-14: qty 8

## 2022-03-14 MED ORDER — BACITRACIN ZINC 500 UNIT/GM EX OINT: TOPICAL_OINTMENT | Freq: Two times a day (BID) | CUTANEOUS | Status: DC

## 2022-03-14 NOTE — Discharge Instructions (Addendum)
Please follow-up with your heart doctor's office tomorrow or on Monday about your congestive heart failure. ?

## 2022-03-14 NOTE — Care Management (Signed)
ED RNCM received consult for [facility] for bilateral weeping leg wounds. RNCM reviewed patients  record discussed with EDP plan for  bilateral unna boots.  Suggested consult with Ortho Tech to apply in the ED with [facility] orders for next change.  ED SW will discuss plan for [facility] services. Patient is active with [facility] CM will send referral to several [facility] who are within the network.  RNCM will follow up with patient to secure a [facility] agency.  ?

## 2022-03-14 NOTE — ED Provider Notes (Signed)
?[facility] ?Provider Note ? ? ?CSN: [id number] ?Arrival date & time: 03/14/22  1420 ? ?  ? ?History ? ?Chief Complaint  ?Patient presents with  ? Leg Swelling  ? Shortness of Breath  ? ? ?Jasmine [name] is a 88 y.o. female with a history of congestive heart failure on 80 mg of Lasix, presenting to the ED with shortness of breath and bilateral edema.  She reports she has felt increasingly short of breath over the past week, and her bilateral lower extremities are significantly edematous, and began turning red.  She has been compliant with her Lasix which she takes 80 mg daily, as well as her other medications including blood thinners.  She denies fevers or chills..  She is still able to walk.  She normally does wear compression stockings. ? ?My review of the medical records her last echocardiogram was in April 2022, one year ago, with EF 50-55%, right ventricular enlargement, severely dilated left atrium, severely dilated right atrium, severe tricuspid regurgitation, moderate mitral regurgitation. ? ?The patient was seen by cardiology Dr Daneen Schick on 02/12/22 in the office, who notes she has also been on metolazone for her her heart failure, and she has chronic lower extremity edema, per his MDM: ? ?Current volume status is adequate given lowish blood pressure slightly less than 478 mmHg systolic.  Continue furosemide daily and metolazone once every other week (5 mg). ?Severe tricuspid regurgitation is now associated with right heart failure.  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  No specific management strategy exists.  We did not performing edge to edge leaflet repair in [place]. ?Controlled rate on Tenormin ?Continue apixaban. ?May need to consider discontinuing irbesartan. ?Followed by Dr. 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 ?  ?Overall plan at her age is symptomatic control of volume.  Prognosis is poor. ?  ? ?HPI ? ?  ? ?Home Medications ?Prior to Admission medications   ?Medication Sig Start Date End Date Taking? Authorizing Provider  ?atenolol (TENORMIN) 100 MG tablet Take 100 mg by mouth daily. 12/14/20   [provider]  ?cefpodoxime (VANTIN) 200 MG tablet Take 200 mg by mouth daily. 01/18/22   [provider]  ?ELIQUIS 2.5 MG TABS tablet Take 2.5 mg by mouth 2 (two) times daily. 08/17/19   [provider]  ?Arna Medici 25 MCG tablet Take 25 mcg by mouth daily before breakfast. 03/07/20   [provider]  ?furosemide (LASIX) 80 MG tablet Take 80 mg by mouth.    [provider]  ?irbesartan (AVAPRO) 75 MG tablet Take 1 tablet by mouth once daily 05/14/21   Belva Crome, MD  ?metolazone (ZAROXOLYN) 5 MG tablet Take 5 mg by mouth once a week. 02/06/22   [provider]  ?   ? ?Allergies    ?Lovenox [enoxaparin], Norvasc [amlodipine besylate], Sulfa antibiotics, Codeine, and Latex   ? ?Review of Systems   ?Review of Systems ? ?Physical Exam ?Updated Vital Signs ?BP 135/63   Pulse 71   Temp (!) 97.5 ?F (36.4 ?C) (Oral)   Resp 18   Ht 5\' 4"  (1.626 m)   Wt 63.5 kg   SpO2 100%   BMI 24.03 kg/m?  ?Physical Exam ?Constitutional:   ?   General: She is not in acute distress. ?HENT:  ?  Head: Normocephalic and atraumatic.  ?Eyes:  ?   Conjunctiva/sclera: Conjunctivae normal.  ?   Pupils: Pupils are equal, round, and reactive to light.  ?Cardiovascular:  ?   Rate and Rhythm: Normal rate. Rhythm irregular.  ?Pulmonary:  ?   Effort: Pulmonary effort is normal. No respiratory distress.  ?   Comments: Mild tachypnea, fine crackles in lower pulmonary exam ?Abdominal:  ?   General: There is no distension.  ?   Tenderness: There is no abdominal tenderness.  ?Musculoskeletal:  ?   Comments: +4 pitting edema to the mid thighs,  with stasis dermatitis to lower extremity, weeping legs  ?Skin: ?   General: Skin is warm and dry.  ?Neurological:  ?   General: No focal deficit present.  ?   Mental Status: She is alert. Mental status is at baseline.  ?Psychiatric:     ?   Mood and Affect: Mood normal.     ?   Behavior: Behavior normal.  ? ? ?ED Results / Procedures / Treatments   ?Labs ?(all labs ordered are listed, but only abnormal results are displayed) ?Labs Reviewed  ?BASIC METABOLIC PANEL - Abnormal; Notable for the following components:  ?    Result Value  ? Chloride 112 (*)   ? CO2 20 (*)   ? Glucose, Bld 146 (*)   ? BUN 80 (*)   ? Creatinine, Ser 2.12 (*)   ? GFR, Estimated 22 (*)   ? All other components within normal limits  ?CBC WITH DIFFERENTIAL/PLATELET - Abnormal; Notable for the following components:  ? WBC 3.6 (*)   ? MCV 103.3 (*)   ? RDW 16.3 (*)   ? Platelets 89 (*)   ? nRBC 4.1 (*)   ? Lymphs Abs 0.6 (*)   ? All other components within normal limits  ?BRAIN NATRIURETIC PEPTIDE - Abnormal; Notable for the following components:  ? B Natriuretic Peptide 1,424.0 (*)   ? All other components within normal limits  ?TROPONIN I (HIGH SENSITIVITY) - Abnormal; Notable for the following components:  ? Troponin I (High Sensitivity) 29 (*)   ? All other components within normal limits  ?TROPONIN I (HIGH SENSITIVITY) - Abnormal; Notable for the following components:  ? Troponin I (High Sensitivity) 27 (*)   ? All other components within normal limits  ?PATHOLOGIST SMEAR REVIEW  ? ? ?EKG ?EKG Interpretation ? ?Date/Time:  Thursday March 14 2022 14:27:24 EDT ?Ventricular Rate:  66 ?PR Interval:    ?QRS Duration: 74 ?QT Interval:  402 ?QTC Calculation: 421 ?R Axis:   194 ?Text Interpretation: Atrial fibrillation with premature ventricular or aberrantly conducted complexes Right superior axis deviation Low voltage QRS Cannot rule out Anteroseptal infarct , age undetermined Abnormal ECG When compared with ECG of 20-Feb-2021 17:26, PREVIOUS ECG  IS PRESENT Confirmed by Octaviano Glow [id number]) on 03/14/2022 5:59:32 PM ? ?Radiology ?DG Chest 2 View ? ?Result Date: 03/14/2022 ?CLINICAL DATA:  Shortness of breath EXAM: CHEST - 2 VIEW COMPARISON:  Chest x-ray dated February 20, 2021 FINDINGS: Unchanged cardiomegaly. Lungs are clear. No pleural effusion pneumothorax. IMPRESSION: No active cardiopulmonary disease. Electronically Signed   By: Yetta Glassman M.D.   On: 03/14/2022 16:45   ? ?Procedures ?Procedures  ? ? ?Medications Ordered in ED ?Medications  ?furosemide (LASIX) injection 80 mg (80 mg Intravenous Given 03/14/22 1849)  ? ? ?ED Course/ Medical Decision Making/ A&P ?Clinical Course as of 03/14/22 2258  ?Thu Mar 14, 2022  ?2049 I did discussion  with the patient and her sister at bedside about the risks and benefits of hospitalization versus outpatient management, they both strongly prefer to go home.  I think this is reasonable given that there is nothing acute to be done, per my own review of the cardiology notes there is nothing further to offer medical management.  I will reach out to Dr. Tamala Julian and send him a message regarding her congestive heart failure.  She was able to produce nearly a liter of urine and feels that she is breathing much better now.  We will wrap her legs with Ace bandages at this time and try to arrange for home wound care if she is eligible.  Her sister will take her home. [MT]  ?  ?Clinical Course User Index ?[MT] Wyvonnia Dusky, MD  ? ?                        ?Medical Decision Making ?Risk ?Prescription drug management. ? ? ?This patient presents to the ED with concern for shortness of breath, extremity edema. This involves an extensive number of treatment options, and is a complaint that carries with it a high risk of complications and morbidity.  The differential diagnosis includes stasis dermatitis versus congestive heart failure is most likely cause.  Doubt bilateral cellulitis.  She is afebrile with no  leukocytosis. ? ?Co-morbidities that complicate the patient evaluation: History of congestive heart failure which is severe and multi valvular ? ?Additional history obtained from patient's twin sister at the bedside ? ?External records fr

## 2022-03-14 NOTE — Progress Notes (Signed)
Transition of Care [facility]) - Emergency Department Mini Assessment ? ? ?Patient Details  ?Name: Jasmine [name] ?MRN: [id number] ?Date of Birth: [birth year] ? ?Transition of Care (TOC) CM/SW Contact:    ?Rodney Booze, LCSW ?Phone Number: ?03/14/2022, 9:47 PM ? ? ?Clinical Narrative: ? ? ? ?ED Mini Assessment: ?What brought you to the Emergency Department? : (P) Pt arrives via [facility] from home c/o dyspnea, leg swelling with weeping. Pt has hx of CHF. Recent fall yesterday. ? ?Barriers to Discharge: (P) No Barriers Identified ? ?Barrier interventions: (P) CSW at  bedside to discuss home health aid needs, patient has agreed with the services. TOC team will contact the patient with updates about services. Patient is being transported home with the sister. CSW confirmed telephone number is correct. ? ?Means of departure: (P) Car ? ?Interventions which prevented an admission or readmission: Home Health Consult or Services, Transportation Screening ? ? ? ?Patient Contact and Communications ?  ?  ?Spoke with: (P) Patient ? ,     ?  ?  ? ?Patient states their goals for this hospitalization and ongoing recovery are:: (P) Patient will like to be at home just needs some extra help. ?CMS Medicare.gov Compare Post Acute Care list provided to:: (P) Patient ?Choice offered to / list presented to : (P) Patient ? ?Admission diagnosis:  Bilateral leg pain; edema ?Patient Active Problem List  ? Diagnosis Date Noted  ? Deviated septum 01/11/2020  ? Epistaxis 01/11/2020  ? CKD (chronic kidney disease) stage 3, GFR 30-59 ml/min (HCC) 04/19/2019  ? TIA (transient ischemic attack) 10/02/2018  ? Long term current use of anticoagulant therapy 02/08/2014  ? Chronic diastolic HF (heart failure) ([place]) 11/21/2012  ? Insulin resistance 11/21/2012  ? Deep venous thrombosis of lower extremity ([place]) 06/02/2012  ? Hypertension 01/07/2012  ? Atrial fibrillation ([place]) 12/23/2011  ? ?PCP:  [name], MD ?Pharmacy:   ?[place], [place] - [address] ?[place] ?[place] [address] ?Phone: [phone] Fax: [phone] ? ? ?

## 2022-03-14 NOTE — Progress Notes (Signed)
Orthopedic Tech Progress Note ?Patient Details:  ?TAIMI TOWE ?04-21-1934 ?[id number] ? ? ?Ortho Devices ?Type of Ortho Device: Unna boot ?Ortho Device/Splint Location: BLE ?Ortho Device/Splint Interventions: Ordered, Application ?  ?Post Interventions ?Patient Tolerated: Well ?Instructions Provided: Care of device, Adjustment of device ? ?Owin Vignola Jeri Modena ?03/14/2022, 10:23 PM ? ?

## 2022-03-14 NOTE — ED Triage Notes (Signed)
Pt arrives via [facility] from home c/o dyspnea, leg swelling with weeping. Pt has hx of CHF. Recent fall yesterday.  ? ?EMS last VS - 138/78,HR 52-77 (a-fib), 100% on RA, RR 16 ?

## 2022-03-14 NOTE — ED Provider Triage Note (Signed)
Emergency Medicine Provider Triage Evaluation Note ? ?Jasmine [name] , a 88 y.o. female  was evaluated in triage.  Pt complains of BLE swelling/leaking onset 1 week. Pt has a history of heart failure and takes lasix without missed doses. Has associated shortness of breath (worse with exertion and laying flat). Hasn't tried any medications for her symptoms. Denies chest pain, fever.  ?  ? ?Review of Systems  ?Positive: As per HPI above ?Negative: ? ?Physical Exam  ?BP (!) 148/72 (BP Location: Left Arm)   Pulse (!) 52   Temp (!) 97.5 ?F (36.4 ?C) (Oral)   Resp 20   Ht 5\' 4"  (1.626 m)   Wt 63.5 kg   SpO2 100%   BMI 24.03 kg/m?  ?Gen:   Awake, no distress   ?Resp:  Normal effort  ?MSK:   Moves extremities without difficulty  ?Other:   ? ?Medical Decision Making  ?Medically screening exam initiated at 3:07 PM.  Appropriate orders placed.  Jasmine [name] was informed that the remainder of the evaluation will be completed by another provider, this initial triage assessment does not replace that evaluation, and the importance of remaining in the ED until their evaluation is complete. ? ? ?  ?Tomica Arseneault A, PA-C ?03/14/22 1510 ? ?

## 2022-03-15 ENCOUNTER — Telehealth: Payer: Self-pay | Admitting: Interventional Cardiology

## 2022-03-15 DIAGNOSIS — I5032 Chronic diastolic (congestive) heart failure: Secondary | ICD-10-CM

## 2022-03-15 LAB — PATHOLOGIST SMEAR REVIEW

## 2022-03-15 NOTE — Telephone Encounter (Signed)
Belva Crome, MD  Loren Racer, RN; [name], MD ?She is failing despite our best efforts. Needs to be seen once by advanced heart failure team to determine if we have any other options.  ? ?Called pt and left message to call back ?

## 2022-03-15 NOTE — Telephone Encounter (Signed)
Spoke with pt and made her aware of recommendation per Dr. Tamala Julian.  Pt agreeable to referral.  Advised I will put that in and the AHF team will be in contact to get her scheduled.  Pt appreciative for call.  ?

## 2022-03-23 ENCOUNTER — Emergency Department (HOSPITAL_COMMUNITY): Payer: Medicare Other

## 2022-03-23 ENCOUNTER — Inpatient Hospital Stay (HOSPITAL_COMMUNITY)
Admission: EM | Admit: 2022-03-23 | Discharge: 2022-04-08 | DRG: 286 | Disposition: E | Payer: Medicare Other | Attending: Internal Medicine | Admitting: Internal Medicine

## 2022-03-23 DIAGNOSIS — I10 Essential (primary) hypertension: Secondary | ICD-10-CM | POA: Diagnosis present

## 2022-03-23 DIAGNOSIS — Z8673 Personal history of transient ischemic attack (TIA), and cerebral infarction without residual deficits: Secondary | ICD-10-CM

## 2022-03-23 DIAGNOSIS — I130 Hypertensive heart and chronic kidney disease with heart failure and stage 1 through stage 4 chronic kidney disease, or unspecified chronic kidney disease: Principal | ICD-10-CM | POA: Diagnosis present

## 2022-03-23 DIAGNOSIS — Z515 Encounter for palliative care: Secondary | ICD-10-CM

## 2022-03-23 DIAGNOSIS — I739 Peripheral vascular disease, unspecified: Secondary | ICD-10-CM

## 2022-03-23 DIAGNOSIS — I5032 Chronic diastolic (congestive) heart failure: Secondary | ICD-10-CM

## 2022-03-23 DIAGNOSIS — Z86718 Personal history of other venous thrombosis and embolism: Secondary | ICD-10-CM

## 2022-03-23 DIAGNOSIS — N184 Chronic kidney disease, stage 4 (severe): Secondary | ICD-10-CM

## 2022-03-23 DIAGNOSIS — I4891 Unspecified atrial fibrillation: Secondary | ICD-10-CM | POA: Diagnosis present

## 2022-03-23 DIAGNOSIS — I2721 Secondary pulmonary arterial hypertension: Secondary | ICD-10-CM | POA: Diagnosis present

## 2022-03-23 DIAGNOSIS — Z20822 Contact with and (suspected) exposure to covid-19: Secondary | ICD-10-CM | POA: Diagnosis present

## 2022-03-23 DIAGNOSIS — R55 Syncope and collapse: Secondary | ICD-10-CM

## 2022-03-23 DIAGNOSIS — R54 Age-related physical debility: Secondary | ICD-10-CM | POA: Diagnosis present

## 2022-03-23 DIAGNOSIS — N179 Acute kidney failure, unspecified: Secondary | ICD-10-CM | POA: Diagnosis present

## 2022-03-23 DIAGNOSIS — Z66 Do not resuscitate: Secondary | ICD-10-CM | POA: Diagnosis not present

## 2022-03-23 DIAGNOSIS — I071 Rheumatic tricuspid insufficiency: Secondary | ICD-10-CM | POA: Diagnosis present

## 2022-03-23 DIAGNOSIS — Z8249 Family history of ischemic heart disease and other diseases of the circulatory system: Secondary | ICD-10-CM

## 2022-03-23 DIAGNOSIS — I5084 End stage heart failure: Secondary | ICD-10-CM | POA: Diagnosis present

## 2022-03-23 DIAGNOSIS — L0390 Cellulitis, unspecified: Secondary | ICD-10-CM | POA: Diagnosis present

## 2022-03-23 DIAGNOSIS — E039 Hypothyroidism, unspecified: Secondary | ICD-10-CM | POA: Diagnosis present

## 2022-03-23 DIAGNOSIS — E871 Hypo-osmolality and hyponatremia: Secondary | ICD-10-CM | POA: Diagnosis present

## 2022-03-23 DIAGNOSIS — E872 Acidosis, unspecified: Secondary | ICD-10-CM | POA: Diagnosis present

## 2022-03-23 DIAGNOSIS — K5900 Constipation, unspecified: Secondary | ICD-10-CM | POA: Diagnosis present

## 2022-03-23 DIAGNOSIS — I4821 Permanent atrial fibrillation: Secondary | ICD-10-CM | POA: Diagnosis present

## 2022-03-23 DIAGNOSIS — E861 Hypovolemia: Secondary | ICD-10-CM | POA: Diagnosis present

## 2022-03-23 DIAGNOSIS — I878 Other specified disorders of veins: Secondary | ICD-10-CM | POA: Diagnosis present

## 2022-03-23 DIAGNOSIS — L03116 Cellulitis of left lower limb: Secondary | ICD-10-CM | POA: Diagnosis present

## 2022-03-23 DIAGNOSIS — Z833 Family history of diabetes mellitus: Secondary | ICD-10-CM

## 2022-03-23 DIAGNOSIS — R001 Bradycardia, unspecified: Secondary | ICD-10-CM | POA: Diagnosis present

## 2022-03-23 DIAGNOSIS — Z79899 Other long term (current) drug therapy: Secondary | ICD-10-CM

## 2022-03-23 DIAGNOSIS — Z7901 Long term (current) use of anticoagulants: Secondary | ICD-10-CM

## 2022-03-23 DIAGNOSIS — E875 Hyperkalemia: Secondary | ICD-10-CM | POA: Diagnosis present

## 2022-03-23 DIAGNOSIS — I5082 Biventricular heart failure: Secondary | ICD-10-CM | POA: Diagnosis present

## 2022-03-23 DIAGNOSIS — I5033 Acute on chronic diastolic (congestive) heart failure: Secondary | ICD-10-CM | POA: Diagnosis present

## 2022-03-23 DIAGNOSIS — L03115 Cellulitis of right lower limb: Secondary | ICD-10-CM | POA: Diagnosis present

## 2022-03-23 DIAGNOSIS — N189 Chronic kidney disease, unspecified: Secondary | ICD-10-CM

## 2022-03-23 DIAGNOSIS — D696 Thrombocytopenia, unspecified: Secondary | ICD-10-CM | POA: Diagnosis present

## 2022-03-23 LAB — CBC WITH DIFFERENTIAL/PLATELET
Abs Immature Granulocytes: 0.02 10*3/uL (ref 0.00–0.07)
Basophils Absolute: 0 10*3/uL (ref 0.0–0.1)
Basophils Relative: 1 %
Eosinophils Absolute: 0 10*3/uL (ref 0.0–0.5)
Eosinophils Relative: 1 %
HCT: 41.3 % (ref 36.0–46.0)
Hemoglobin: 13.1 g/dL (ref 12.0–15.0)
Immature Granulocytes: 1 %
Lymphocytes Relative: 26 %
Lymphs Abs: 1.1 10*3/uL (ref 0.7–4.0)
MCH: 33.8 pg (ref 26.0–34.0)
MCHC: 31.7 g/dL (ref 30.0–36.0)
MCV: 106.4 fL — ABNORMAL HIGH (ref 80.0–100.0)
Monocytes Absolute: 0.4 10*3/uL (ref 0.1–1.0)
Monocytes Relative: 11 %
Neutro Abs: 2.6 10*3/uL (ref 1.7–7.7)
Neutrophils Relative %: 60 %
Platelets: 73 10*3/uL — ABNORMAL LOW (ref 150–400)
RBC: 3.88 MIL/uL (ref 3.87–5.11)
RDW: 17.2 % — ABNORMAL HIGH (ref 11.5–15.5)
WBC: 4.2 10*3/uL (ref 4.0–10.5)
nRBC: 0.5 % — ABNORMAL HIGH (ref 0.0–0.2)

## 2022-03-23 LAB — COMPREHENSIVE METABOLIC PANEL
ALT: 33 U/L (ref 0–44)
AST: 64 U/L — ABNORMAL HIGH (ref 15–41)
Albumin: 3.3 g/dL — ABNORMAL LOW (ref 3.5–5.0)
Alkaline Phosphatase: 156 U/L — ABNORMAL HIGH (ref 38–126)
Anion gap: 7 (ref 5–15)
BUN: 52 mg/dL — ABNORMAL HIGH (ref 8–23)
CO2: 18 mmol/L — ABNORMAL LOW (ref 22–32)
Calcium: 8.5 mg/dL — ABNORMAL LOW (ref 8.9–10.3)
Chloride: 110 mmol/L (ref 98–111)
Creatinine, Ser: 1.86 mg/dL — ABNORMAL HIGH (ref 0.44–1.00)
GFR, Estimated: 26 mL/min — ABNORMAL LOW (ref >60)
Glucose, Bld: 112 mg/dL — ABNORMAL HIGH (ref 70–99)
Potassium: 5.3 mmol/L — ABNORMAL HIGH (ref 3.5–5.1)
Sodium: 135 mmol/L (ref 135–145)
Total Bilirubin: 3.1 mg/dL — ABNORMAL HIGH (ref 0.3–1.2)
Total Protein: 7.2 g/dL (ref 6.5–8.1)

## 2022-03-23 LAB — I-STAT CHEM 8, ED
BUN: 66 mg/dL — ABNORMAL HIGH (ref 8–23)
Calcium, Ion: 1.12 mmol/L — ABNORMAL LOW (ref 1.15–1.40)
Chloride: 114 mmol/L — ABNORMAL HIGH (ref 98–111)
Creatinine, Ser: 2 mg/dL — ABNORMAL HIGH (ref 0.44–1.00)
Glucose, Bld: 110 mg/dL — ABNORMAL HIGH (ref 70–99)
HCT: 41 % (ref 36.0–46.0)
Hemoglobin: 13.9 g/dL (ref 12.0–15.0)
Potassium: 4.2 mmol/L (ref 3.5–5.1)
Sodium: 142 mmol/L (ref 135–145)
TCO2: 22 mmol/L (ref 22–32)

## 2022-03-23 LAB — PROTIME-INR
INR: 1.8 — ABNORMAL HIGH (ref 0.8–1.2)
Prothrombin Time: 20.4 seconds — ABNORMAL HIGH (ref 11.4–15.2)

## 2022-03-23 LAB — TROPONIN I (HIGH SENSITIVITY): Troponin I (High Sensitivity): 33 ng/L — ABNORMAL HIGH (ref <18)

## 2022-03-23 LAB — APTT: aPTT: 39 seconds — ABNORMAL HIGH (ref 24–36)

## 2022-03-23 LAB — RESP PANEL BY RT-PCR (FLU A&B, COVID) ARPGX2
Influenza A by PCR: NEGATIVE
Influenza B by PCR: NEGATIVE
SARS Coronavirus 2 by RT PCR: NEGATIVE

## 2022-03-23 LAB — BRAIN NATRIURETIC PEPTIDE: B Natriuretic Peptide: 1555 pg/mL — ABNORMAL HIGH (ref 0.0–100.0)

## 2022-03-23 LAB — LACTIC ACID, PLASMA: Lactic Acid, Venous: 1.3 mmol/L (ref 0.5–1.9)

## 2022-03-23 MED ORDER — SODIUM CHLORIDE 0.9 % IV SOLN: INTRAVENOUS | Status: DC

## 2022-03-23 NOTE — ED Triage Notes (Signed)
Pt bib [facility] for syncope.  Family could not arouse her while she was sitting in recliner so they called ems.  Ems states she was gazing to the right but that with some coaxing they were able to get her more alert.  Pt endorses intermittent lightedness and dizziness recently with some tremors and chills. Pt endorses increased weakness recently per ems. EMS endorses negative stroke screen but ETC02 of 22-23 which fits into their sepsis screening.  Ems endorses rales in lower lobes of lungs.  ? ?PT has edema with open wounds and some cellulitis to BLE per ems.  Wounds are wrapped at this time. Pt is on  Eliquis for afib with hx of same.  Pt is in afib on their strips.  ? ?Ems vs 130/70 100t% HR 80-100 afib, RR 30, CBG 159.  22G L FA ?

## 2022-03-23 NOTE — ED Provider Notes (Signed)
?[facility] ?Provider Note ? ? ?CSN: [id number] ?Arrival date & time: 04/04/2022  1926 ? ?  ? ?History ? ?No chief complaint on file. ? ? ?Jasmine [name] is a 88 y.o. female who presents today for evaluation of syncope. ?History is primarily obtained from EMS. ?Reportedly patient was in a recliner and family could not get her to arouse.  EMS reports that she was using to the right but that with stimulation they were able to get her more alert and interactive.  Patient has reported intermittent lightheadedness and dizziness recently.  She has recently had increased weakness.  Patient states that she is short of breath all the time.  She has been being treated for open wounds and weeping sores on her bilateral lower extremities.  She is reportedly compliant with Eliquis for her A-fib. ? ?Patient states to me that her legs have been hurting more today. She states that she ate earlier.  ? ?When patient's family arrived they report that patient was unresponsive for about 15 minutes.  She was seated in her recliner for this entire time and did not fall.  They feel like she is currently back to normal.  She had not been complaining of feeling sick before hand. ? ?EMS initially had placed her on 2 L nasal cannula as she reported feeling short of breath.  When I asked patient about this she says that she is always short of breath and not having any change recently. ? ?HPI ? ?  ? ?Home Medications ?Prior to Admission medications   ?Medication Sig Start Date End Date Taking? Authorizing Provider  ?atenolol (TENORMIN) 100 MG tablet Take 100 mg by mouth daily. 12/14/20   [provider]  ?cefpodoxime (VANTIN) 200 MG tablet Take 200 mg by mouth daily. 01/18/22   [provider]  ?ELIQUIS 2.5 MG TABS tablet Take 2.5 mg by mouth 2 (two) times daily. 08/17/19   [provider]  ?Arna Medici 25 MCG tablet Take 25 mcg by mouth daily before breakfast. 03/07/20   [provider]  ?furosemide (LASIX) 80 MG tablet Take 80 mg by mouth.    [provider]  ?irbesartan (AVAPRO) 75 MG tablet Take 1 tablet by mouth once daily 05/14/21   Belva Crome, MD  ?metolazone (ZAROXOLYN) 5 MG tablet Take 5 mg by mouth once a week. 02/06/22   [provider]  ?   ? ?Allergies    ?Lovenox [enoxaparin], Norvasc [amlodipine besylate], Sulfa antibiotics, Codeine, and Latex   ? ?Review of Systems   ?Review of Systems ? ?Physical Exam ?Updated Vital Signs ?BP 138/69   Pulse 76   Temp 97.6 ?F (36.4 ?C) (Oral)   Resp (!) 21   SpO2 100%  ?Physical Exam ?Vitals and nursing note reviewed.  ?Constitutional:   ?   General: She is not in acute distress. ?   Appearance: She is not ill-appearing.  ?HENT:  ?   Head: Atraumatic.  ?Eyes:  ?   Conjunctiva/sclera: Conjunctivae normal.  ?Cardiovascular:  ?   Rate and Rhythm: Normal rate. Rhythm irregular.  ?Pulmonary:  ?   Effort: No respiratory distress.  ?   Breath sounds: Examination of the right-upper field reveals rales. Examination of the left-upper field reveals rales. Examination of the right-middle field reveals rales. Examination of the left-middle field reveals rales. Examination of the right-lower field reveals rales. Examination of the left-lower field reveals rales. Rales present.  ?Abdominal:  ?   General:  There is no distension.  ?Musculoskeletal:  ?   Cervical back: Normal range of motion and neck supple.  ?   Comments: 3+ pitting edema bilateral lower extremities.  ?Feet:  ?   Comments: Left foot and lower extremity are obviously cold when compared to right.  I am unable to Doppler DP/PT pulses bilaterally. ?I am able to palpate a right femoral pulse, I am unable to palpate a left femoral pulse. ?Skin: ?   General: Skin is warm.  ?   Comments: There are wounds with bullae and generalized weeping over the bilateral lower extremities.  ?Neurological:  ?   Mental Status: She is alert.  ?   Comments: Awake and alert, answers all  questions appropriately.  Speech is not slurred. ?Facial movements are grossly symmetric.  She is able to provide history without difficulty.  ?Psychiatric:     ?   Mood and Affect: Mood normal.     ?   Behavior: Behavior normal.  ? ? ?ED Results / Procedures / Treatments   ?Labs ?(all labs ordered are listed, but only abnormal results are displayed) ?Labs Reviewed  ?CBC WITH DIFFERENTIAL/PLATELET - Abnormal; Notable for the following components:  ?    Result Value  ? MCV 106.4 (*)   ? RDW 17.2 (*)   ? Platelets 73 (*)   ? nRBC 0.5 (*)   ? All other components within normal limits  ?COMPREHENSIVE METABOLIC PANEL - Abnormal; Notable for the following components:  ? Potassium 5.3 (*)   ? CO2 18 (*)   ? Glucose, Bld 112 (*)   ? BUN 52 (*)   ? Creatinine, Ser 1.86 (*)   ? Calcium 8.5 (*)   ? Albumin 3.3 (*)   ? AST 64 (*)   ? Alkaline Phosphatase 156 (*)   ? Total Bilirubin 3.1 (*)   ? GFR, Estimated 26 (*)   ? All other components within normal limits  ?PROTIME-INR - Abnormal; Notable for the following components:  ? Prothrombin Time 20.4 (*)   ? INR 1.8 (*)   ? All other components within normal limits  ?APTT - Abnormal; Notable for the following components:  ? aPTT 39 (*)   ? All other components within normal limits  ?BRAIN NATRIURETIC PEPTIDE - Abnormal; Notable for the following components:  ? B Natriuretic Peptide 1,555.0 (*)   ? All other components within normal limits  ?I-STAT CHEM 8, ED - Abnormal; Notable for the following components:  ? Chloride 114 (*)   ? BUN 66 (*)   ? Creatinine, Ser 2.00 (*)   ? Glucose, Bld 110 (*)   ? Calcium, Ion 1.12 (*)   ? All other components within normal limits  ?TROPONIN I (HIGH SENSITIVITY) - Abnormal; Notable for the following components:  ? Troponin I (High Sensitivity) 33 (*)   ? All other components within normal limits  ?RESP PANEL BY RT-PCR (FLU A&B, COVID) [id number]  ?LACTIC ACID, PLASMA  ?URINALYSIS, ROUTINE W REFLEX MICROSCOPIC  ?LACTIC ACID, PLASMA  ?TROPONIN I (HIGH  SENSITIVITY)  ? ? ?EKG ?None ? ?Radiology ?CT HEAD WO CONTRAST (5MM) ? ?Result Date: 03/30/2022 ?CLINICAL DATA:  Altered mental status. EXAM: CT HEAD WITHOUT CONTRAST TECHNIQUE: Contiguous axial images were obtained from the base of the skull through the vertex without intravenous contrast. RADIATION DOSE REDUCTION: This exam was performed according to the departmental dose-optimization program which includes automated exposure control, adjustment of the mA and/or kV according to patient size and/or use  of iterative reconstruction technique. COMPARISON:  October 02, 2018 FINDINGS: Brain: There is mild cerebral atrophy with widening of the extra-axial spaces and ventricular dilatation. There are areas of decreased attenuation within the white matter tracts of the supratentorial brain, consistent with microvascular disease changes. A small, stable dural calcification is seen along the left frontal lobe. Vascular: No hyperdense vessel or unexpected calcification. Skull: Normal. Negative for fracture or focal lesion. Sinuses/Orbits: A small sphenoid sinus air-fluid level is noted. Other: None. IMPRESSION: 1. No acute intracranial abnormality. 2. Generalized cerebral atrophy with chronic white matter small vessel ischemic changes. Electronically Signed   By: Virgina Norfolk M.D.   On: 03/17/2022 21:46  ? ?DG Chest Portable 1 View ? ?Result Date: 04/05/2022 ?CLINICAL DATA:  Shortness of breath EXAM: PORTABLE CHEST 1 VIEW COMPARISON:  03/14/2022, 02/20/2021 FINDINGS: Moderate to marked cardiomegaly with globular cardiac configuration. No edema, pleural effusion or focal airspace opacity. Aortic atherosclerosis IMPRESSION: Moderate to marked cardiomegaly with globular cardiac configuration which may be due to multi chamber enlargement versus pericardial effusion. No edema. Electronically Signed   By: Donavan Foil M.D.   On: 03/18/2022 21:01   ? ?Procedures ?Procedures  ? ? ?Medications Ordered in ED ?Medications  ?0.9 %   sodium chloride infusion (has no administration in time range)  ? ? ?ED Course/ Medical Decision Making/ A&P ?Clinical Course as of 04/05/2022 2331  ?Sat Mar 23, 2022  ?2027 I spoke with Vascular Dr. Scot Dock who will

## 2022-03-23 NOTE — Consult Note (Signed)
? ?ASSESSMENT & PLAN  ? ?PERIPHERAL ARTERIAL DISEASE: This patient has chronic arterial insufficiency.  Based on her exam and previous Doppler study in February of last year she has multilevel arterial occlusive disease with both inflow disease and infrainguinal arterial occlusive disease.  She had marginal ABIs bilaterally a year ago I suspect things are not much different from then.  I do not think she is had an acute arterial event.  She has A-fib but has been on her Eliquis.  I would not recommend an aggressive work-up at this point given that she has more pressing issues.  The etiology of her neurologic change is being worked up. ? ?CHRONIC VENOUS INSUFFICIENCY: The patient also has evidence of chronic venous insufficiency CEAP C4 disease (hyperpigmentation).  Normally I would recommend elevation and compression but given her peripheral arterial disease she can only elevate her legs on 1 or 2 pillows and should not have significant compression because of her PAD. ? ?CHRONIC KIDNEY DISEASE: Patient has significant chronic kidney disease and therefore we will hold off on CT angio.  If she showed evidence of progression of her peripheral arterial disease we could consider CO2 arteriography but again I do not think this is an acute event and she has chronic multilevel disease.  Given her age and multiple medical comorbidities she is not a good candidate for an aggressive approach. ? ?REASON FOR CONSULT:   ? ?Cold left foot.  The consult is requested by Dr. Jeanell Sparrow ? ?HPI:  ? ?Jasmine [name] is a 88 y.o. female who presents to the emergency department with syncope.  She was found by her family to be unresponsive.  She was brought to the emergency department.  She was sitting in a recliner and she could not be aroused.  When EMS arrived she was reportedly gazing to the right.  She had been having some intermittent lightheadedness and dizziness recently.  On exam it was felt that the left foot was cooler than the right  and therefore vascular surgery was consulted. ? ?I do not get any clear-cut history of claudication although I suspect her activity is very limited.  She denies any history of rest pain.  She denies any history of nonhealing ulcers.  She is unaware of any previous history of DVT, although her records states that she has had a previous DVT in the past.  She did tell me that she had a clot in her lungs many years ago. ? ?Her risk factors for peripheral arterial disease include hypertension and hypercholesterolemia.  She denies any history of diabetes, family history of premature cardiovascular disease, or smoking history. ? ?She is on Eliquis for A-fib.  She has not missed any doses. ? ?Past Medical History:  ?Diagnosis Date  ? Allergic rhinitis due to pollen   ? Atrial fibrillation ([place]) 12/23/2011  ? Atrial fibrillation with rapid ventricular response ([place])   ? Chronic diastolic HF (heart failure) ([place]) 11/21/2012  ? CKD (chronic kidney disease) stage 3, GFR 30-59 ml/min ([place]) 04/19/2019  ? Congestive heart failure, unspecified   ? patient states she is not aware of this  ? Deep venous thrombosis of lower extremity ([place]) 06/02/2012  ? Deviated septum 01/11/2020  ? Dizziness   ? DVT (deep venous thrombosis) ([facility]) left leg  ? Epistaxis 01/11/2020  ? Essential hypertension, malignant   ? GERD (gastroesophageal reflux disease)   ? Heart murmur   ? Insulin resistance   ? History of insulin resistance.  ? Insulin resistance   ?  Long term current use of anticoagulant therapy 02/08/2014  ? coumadin   ? Obesity   ? Pure hypercholesterolemia   ? TIA (transient ischemic attack) 10/02/2018  ? Vertigo   ? ? ?Family History  ?Problem Relation Age of Onset  ? Diabetes Mother   ? Hypertension Sister   ? Pulmonary embolism Sister   ? ? ?SOCIAL HISTORY: ?Social History  ? ?Tobacco Use  ? Smoking status: Never  ? Smokeless tobacco: Never  ?Substance Use Topics  ? Alcohol use: No  ? ? ?Allergies  ?Allergen Reactions  ? Lovenox [Enoxaparin]   ?  Norvasc [Amlodipine Besylate]   ?  Pt unsure   ? Sulfa Antibiotics   ?  Dizziness   ? Codeine Rash  ? Latex Rash  ? ? ?No current facility-administered medications for this encounter.  ? ?Current Outpatient Medications  ?Medication Sig Dispense Refill  ? atenolol (TENORMIN) 100 MG tablet Take 100 mg by mouth daily.    ? cefpodoxime (VANTIN) 200 MG tablet Take 200 mg by mouth daily.    ? ELIQUIS 2.5 MG TABS tablet Take 2.5 mg by mouth 2 (two) times daily.    ? EUTHYROX 25 MCG tablet Take 25 mcg by mouth daily before breakfast.    ? furosemide (LASIX) 80 MG tablet Take 80 mg by mouth.    ? irbesartan (AVAPRO) 75 MG tablet Take 1 tablet by mouth once daily 90 tablet 3  ? metolazone (ZAROXOLYN) 5 MG tablet Take 5 mg by mouth once a week.    ? ? ?REVIEW OF SYSTEMS:  ?[X]  denotes positive finding, [ ]  denotes negative finding ?Cardiac  Comments:  ?Chest pain or chest pressure:    ?Shortness of breath upon exertion: x   ?Short of breath when lying flat:    ?Irregular heart rhythm:    ?    ?Vascular    ?Pain in calf, thigh, or hip brought on by ambulation:    ?Pain in feet at night that wakes you up from your sleep:     ?Blood clot in your veins:    ?Leg swelling:  x   ?    ?Pulmonary    ?Oxygen at home:    ?Productive cough:     ?Wheezing:     ?    ?Neurologic    ?Sudden weakness in arms or legs:     ?Sudden numbness in arms or legs:     ?Sudden onset of difficulty speaking or slurred speech:    ?Temporary loss of vision in one eye:     ?Problems with dizziness:  x   ?    ?Gastrointestinal    ?Blood in stool:     ?Vomited blood:     ?    ?Genitourinary    ?Burning when urinating:     ?Blood in urine:    ?    ?Psychiatric    ?Major depression:     ?    ?Hematologic    ?Bleeding problems:    ?Problems with blood clotting too easily:    ?    ?Skin    ?Rashes or ulcers: x   ?    ?Constitutional    ?Fever or chills:    ?- ? ?PHYSICAL EXAM:  ? ?Vitals:  ? 03/26/2022 1949 03/11/2022 1955  ?BP: (!) 146/71   ?Pulse: 60   ?Resp: 18    ?Temp: 97.6 ?F (36.4 ?C)   ?TempSrc: Oral   ?SpO2: 100% 100%  ? ?There is  no height or weight on file to calculate BMI. ?GENERAL: The patient is a well-nourished female, in no acute distress. The vital signs are documented above. ?CARDIAC: There is a regular rate and rhythm.  ?VASCULAR: I do not detect carotid bruits. ?She has palpable radial pulses. ?On the right side she has a palpable femoral pulse.  I cannot palpate pedal pulses.  She has a monophasic dorsalis pedis and posterior tibial signal on the right with the Doppler. ?On the left side, I cannot palpate a femoral pulse.  I cannot palpate pedal pulses.  She does have a monophasic posterior tibial and anterior tibial signal with the Doppler.  I cannot obtain a dorsalis pedis signal. ?She has significant bilateral lower extremity swelling and hyperpigmentation consistent with chronic venous insufficiency. ? ? ?PULMONARY: There is good air exchange bilaterally without wheezing or rales. ?ABDOMEN: Soft and non-tender with normal pitched bowel sounds.  ?MUSCULOSKELETAL: There are no major deformities. ?NEUROLOGIC: No focal weakness or paresthesias are detected. ?SKIN: There are no ulcers or rashes noted. ?PSYCHIATRIC: The patient has a normal affect. ? ?DATA:   ? ?VASCULAR DOPPLER STUDY: -I reviewed her arterial Doppler study that was done in February of 2022. ? ?On the right side she had a monophasic dorsalis pedis and posterior tibial signal with an ABI 55%.  Toe pressure was 44 mmHg. ? ?On the left side she had a monophasic dorsalis pedis and posterior tibial signal with with an ABI 59% and a toe pressure 58 mmHg. ? ?She also had an arterial duplex in February 22 which showed evidence of aortoiliac disease given that she had monophasic common femoral signals bilaterally. ? ?LABS: Her creatinine is 2.12.  GFR is 23.  Her BNP is 1113 ? ? ? ?Deitra Mayo ?[facility] ? ?

## 2022-03-24 ENCOUNTER — Inpatient Hospital Stay (HOSPITAL_COMMUNITY): Payer: Medicare Other

## 2022-03-24 DIAGNOSIS — L0390 Cellulitis, unspecified: Secondary | ICD-10-CM | POA: Diagnosis not present

## 2022-03-24 DIAGNOSIS — M79605 Pain in left leg: Secondary | ICD-10-CM

## 2022-03-24 DIAGNOSIS — R55 Syncope and collapse: Secondary | ICD-10-CM

## 2022-03-24 DIAGNOSIS — D696 Thrombocytopenia, unspecified: Secondary | ICD-10-CM | POA: Diagnosis present

## 2022-03-24 DIAGNOSIS — E872 Acidosis, unspecified: Secondary | ICD-10-CM | POA: Diagnosis present

## 2022-03-24 DIAGNOSIS — R609 Edema, unspecified: Secondary | ICD-10-CM | POA: Diagnosis not present

## 2022-03-24 DIAGNOSIS — I5082 Biventricular heart failure: Secondary | ICD-10-CM | POA: Diagnosis present

## 2022-03-24 DIAGNOSIS — L03116 Cellulitis of left lower limb: Secondary | ICD-10-CM | POA: Diagnosis present

## 2022-03-24 DIAGNOSIS — Z79899 Other long term (current) drug therapy: Secondary | ICD-10-CM | POA: Diagnosis not present

## 2022-03-24 DIAGNOSIS — I2721 Secondary pulmonary arterial hypertension: Secondary | ICD-10-CM | POA: Diagnosis present

## 2022-03-24 DIAGNOSIS — I739 Peripheral vascular disease, unspecified: Secondary | ICD-10-CM | POA: Diagnosis not present

## 2022-03-24 DIAGNOSIS — L03115 Cellulitis of right lower limb: Secondary | ICD-10-CM | POA: Diagnosis present

## 2022-03-24 DIAGNOSIS — R54 Age-related physical debility: Secondary | ICD-10-CM | POA: Diagnosis present

## 2022-03-24 DIAGNOSIS — Z7189 Other specified counseling: Secondary | ICD-10-CM | POA: Diagnosis not present

## 2022-03-24 DIAGNOSIS — K5900 Constipation, unspecified: Secondary | ICD-10-CM | POA: Diagnosis present

## 2022-03-24 DIAGNOSIS — I4821 Permanent atrial fibrillation: Secondary | ICD-10-CM | POA: Diagnosis present

## 2022-03-24 DIAGNOSIS — E871 Hypo-osmolality and hyponatremia: Secondary | ICD-10-CM | POA: Diagnosis present

## 2022-03-24 DIAGNOSIS — I10 Essential (primary) hypertension: Secondary | ICD-10-CM | POA: Diagnosis not present

## 2022-03-24 DIAGNOSIS — I5033 Acute on chronic diastolic (congestive) heart failure: Secondary | ICD-10-CM | POA: Diagnosis present

## 2022-03-24 DIAGNOSIS — N184 Chronic kidney disease, stage 4 (severe): Secondary | ICD-10-CM | POA: Diagnosis present

## 2022-03-24 DIAGNOSIS — E039 Hypothyroidism, unspecified: Secondary | ICD-10-CM | POA: Diagnosis present

## 2022-03-24 DIAGNOSIS — E861 Hypovolemia: Secondary | ICD-10-CM | POA: Diagnosis present

## 2022-03-24 DIAGNOSIS — I130 Hypertensive heart and chronic kidney disease with heart failure and stage 1 through stage 4 chronic kidney disease, or unspecified chronic kidney disease: Secondary | ICD-10-CM | POA: Diagnosis present

## 2022-03-24 DIAGNOSIS — E875 Hyperkalemia: Secondary | ICD-10-CM | POA: Diagnosis present

## 2022-03-24 DIAGNOSIS — M79604 Pain in right leg: Secondary | ICD-10-CM

## 2022-03-24 DIAGNOSIS — Z66 Do not resuscitate: Secondary | ICD-10-CM | POA: Diagnosis present

## 2022-03-24 DIAGNOSIS — N179 Acute kidney failure, unspecified: Secondary | ICD-10-CM | POA: Diagnosis present

## 2022-03-24 DIAGNOSIS — I071 Rheumatic tricuspid insufficiency: Secondary | ICD-10-CM | POA: Diagnosis present

## 2022-03-24 DIAGNOSIS — Z20822 Contact with and (suspected) exposure to covid-19: Secondary | ICD-10-CM | POA: Diagnosis present

## 2022-03-24 DIAGNOSIS — I878 Other specified disorders of veins: Secondary | ICD-10-CM | POA: Diagnosis present

## 2022-03-24 DIAGNOSIS — I5084 End stage heart failure: Secondary | ICD-10-CM | POA: Diagnosis present

## 2022-03-24 DIAGNOSIS — Z515 Encounter for palliative care: Secondary | ICD-10-CM | POA: Diagnosis not present

## 2022-03-24 DIAGNOSIS — Z86718 Personal history of other venous thrombosis and embolism: Secondary | ICD-10-CM | POA: Diagnosis not present

## 2022-03-24 DIAGNOSIS — R0602 Shortness of breath: Secondary | ICD-10-CM | POA: Diagnosis not present

## 2022-03-24 DIAGNOSIS — I50810 Right heart failure, unspecified: Secondary | ICD-10-CM | POA: Diagnosis not present

## 2022-03-24 LAB — COMPREHENSIVE METABOLIC PANEL
ALT: 26 U/L (ref 0–44)
AST: 43 U/L — ABNORMAL HIGH (ref 15–41)
Albumin: 3 g/dL — ABNORMAL LOW (ref 3.5–5.0)
Alkaline Phosphatase: 131 U/L — ABNORMAL HIGH (ref 38–126)
Anion gap: 11 (ref 5–15)
BUN: 48 mg/dL — ABNORMAL HIGH (ref 8–23)
CO2: 16 mmol/L — ABNORMAL LOW (ref 22–32)
Calcium: 9 mg/dL (ref 8.9–10.3)
Chloride: 116 mmol/L — ABNORMAL HIGH (ref 98–111)
Creatinine, Ser: 1.74 mg/dL — ABNORMAL HIGH (ref 0.44–1.00)
GFR, Estimated: 28 mL/min — ABNORMAL LOW (ref >60)
Glucose, Bld: 91 mg/dL (ref 70–99)
Potassium: 3.8 mmol/L (ref 3.5–5.1)
Sodium: 143 mmol/L (ref 135–145)
Total Bilirubin: 2.9 mg/dL — ABNORMAL HIGH (ref 0.3–1.2)
Total Protein: 7 g/dL (ref 6.5–8.1)

## 2022-03-24 LAB — URINALYSIS, ROUTINE W REFLEX MICROSCOPIC
Bacteria, UA: NONE SEEN
Bilirubin Urine: NEGATIVE
Glucose, UA: NEGATIVE mg/dL
Hgb urine dipstick: NEGATIVE
Ketones, ur: NEGATIVE mg/dL
Leukocytes,Ua: NEGATIVE
Nitrite: NEGATIVE
Protein, ur: 30 mg/dL — AB
Specific Gravity, Urine: 1.024 (ref 1.005–1.030)
pH: 5 (ref 5.0–8.0)

## 2022-03-24 LAB — CBC
HCT: 42 % (ref 36.0–46.0)
Hemoglobin: 12.8 g/dL (ref 12.0–15.0)
MCH: 33.1 pg (ref 26.0–34.0)
MCHC: 30.5 g/dL (ref 30.0–36.0)
MCV: 108.5 fL — ABNORMAL HIGH (ref 80.0–100.0)
Platelets: 62 10*3/uL — ABNORMAL LOW (ref 150–400)
RBC: 3.87 MIL/uL (ref 3.87–5.11)
RDW: 17.2 % — ABNORMAL HIGH (ref 11.5–15.5)
WBC: 5.4 10*3/uL (ref 4.0–10.5)
nRBC: 0 % (ref 0.0–0.2)

## 2022-03-24 LAB — TSH: TSH: 5.836 u[IU]/mL — ABNORMAL HIGH (ref 0.350–4.500)

## 2022-03-24 LAB — ECHOCARDIOGRAM COMPLETE
AR max vel: 1.34 cm2
AV Area VTI: 1.5 cm2
AV Area mean vel: 1.6 cm2
AV Mean grad: 3.5 mmHg
AV Peak grad: 9 mmHg
Ao pk vel: 1.5 m/s
Area-P 1/2: 6.17 cm2
MV M vel: 4.93 m/s
MV Peak grad: 97.2 mmHg
S' Lateral: 2.3 cm

## 2022-03-24 LAB — TROPONIN I (HIGH SENSITIVITY): Troponin I (High Sensitivity): 33 ng/L — ABNORMAL HIGH (ref <18)

## 2022-03-24 LAB — LACTIC ACID, PLASMA: Lactic Acid, Venous: 1.6 mmol/L (ref 0.5–1.9)

## 2022-03-24 MED ORDER — SPIRONOLACTONE 25 MG PO TABS
25.0000 mg | ORAL_TABLET | Freq: Every day | ORAL | Status: AC
  Administered 2022-03-24 – 2022-03-26 (×3): 25 mg via ORAL
  Filled 2022-03-24 (×3): qty 1

## 2022-03-24 MED ORDER — FUROSEMIDE 10 MG/ML IJ SOLN
40.0000 mg | Freq: Once | INTRAMUSCULAR | Status: AC
  Administered 2022-03-24: 40 mg via INTRAVENOUS
  Filled 2022-03-24: qty 4

## 2022-03-24 MED ORDER — SODIUM CHLORIDE 0.9 % IV SOLN
1.0000 g | INTRAVENOUS | Status: DC
  Administered 2022-03-24 – 2022-03-27 (×4): 1 g via INTRAVENOUS
  Filled 2022-03-24 (×4): qty 10

## 2022-03-24 MED ORDER — APIXABAN 2.5 MG PO TABS
2.5000 mg | ORAL_TABLET | Freq: Two times a day (BID) | ORAL | Status: DC
  Administered 2022-03-24 – 2022-03-27 (×7): 2.5 mg via ORAL
  Filled 2022-03-24 (×10): qty 1

## 2022-03-24 MED ORDER — ACETAMINOPHEN 650 MG RE SUPP: 650.0000 mg | Freq: Four times a day (QID) | RECTAL | Status: DC | PRN

## 2022-03-24 MED ORDER — ACETAMINOPHEN 325 MG PO TABS
650.0000 mg | ORAL_TABLET | Freq: Four times a day (QID) | ORAL | Status: DC | PRN
  Administered 2022-03-28: 650 mg via ORAL
  Filled 2022-03-24: qty 2

## 2022-03-24 MED ORDER — LEVOTHYROXINE SODIUM 25 MCG PO TABS
25.0000 ug | ORAL_TABLET | Freq: Every day | ORAL | Status: DC
  Administered 2022-03-24 – 2022-04-05 (×13): 25 ug via ORAL
  Filled 2022-03-24 (×13): qty 1

## 2022-03-24 MED ORDER — FUROSEMIDE 20 MG PO TABS: 80.0000 mg | ORAL_TABLET | Freq: Every day | ORAL | Status: DC

## 2022-03-24 NOTE — H&P (Signed)
?History and Physical  ? ? ?Jasmine [name] AUQ:[id number] DOB: 02/06/1934 DOA: 04/02/2022 ? ?PCP: [name], MD ? ?Patient coming from: Home ? ?Chief Complaint: Syncope ? ?HPI: Jasmine [name] is a 88 y.o. female with medical history significant of chronic A-fib on Eliquis, mitral and tricuspid regurgitation, chronic diastolic CHF, hypertension, hypothyroidism, CKD stage IV, DVT, GERD, TIA, vertigo presents to the ED via EMS for evaluation of syncope.  Patient was in the recliner and family could not get her to arouse for at least 15 minutes.  When EMS arrived, patient was gazing to the right but with stimulation they were able to get her more alert and interactive.  Patient reported having some intermittent lightheaded and dizziness recently.  In the ED, vital signs stable on arrival.  Labs showing no leukocytosis or anemia.  Platelet count 73k, has chronic thrombocytopenia but platelet count lower than baseline.  Potassium 5.3.  Bicarb 18, anion gap 7.  Blood glucose 112.  BUN 52, creatinine 1.8 (baseline 1.5-1.8).  AST 64, ALT 33, alk phos 156, T. bili 3.1.  UA pending.  Lactic acid normal.  COVID and influenza PCR negative.  High-sensitivity troponin 33, no significant change compared to recent labs.  BNP 1555.  Chest x-ray showing moderate to marked cardiomegaly with globular cardiac configuration which may be due to multichamber enlargement versus pericardial effusion; no pulmonary edema.  CT head negative for acute finding. Patient noted to have a cold left foot and vascular surgery consulted.  Dr. Scot Dock saw the patient and did not think that she was having an acute arterial event and did not recommend any aggressive work-up at this time. ? ?Patient lives with her twin sister who states she found the patient on the recliner unresponsive.  Sister thinks she was unresponsive for about 10 or 15 minutes and EMS was called.  Patient reports intermittent dizziness and lightheadedness.  Reports chronic  shortness of breath which she attributes to her underlying cardiac conditions.  Reports history of A-fib and chronic palpitations.  Reports mild pain in her ribs on the right which has been going on for a long time, denies any chest pressure.  States she was recently prescribed a 1 week course of doxycycline for infection in her lower legs. Toward the end of her antibiotic course, she experienced side effects including vomiting and diarrhea and had to stop taking it.  She thinks the infection of her legs has not improved and they continue to be very swollen.  She is taking metolazone once a week and states her nephrologist recently increased the dose of her Lasix but her legs are still very swollen. ? ?Review of Systems:  ?Review of Systems  ?All other systems reviewed and are negative. ? ?Past Medical History:  ?Diagnosis Date  ? Allergic rhinitis due to pollen   ? Atrial fibrillation ([place]) 12/23/2011  ? Atrial fibrillation with rapid ventricular response ([place])   ? Chronic diastolic HF (heart failure) ([place]) 11/21/2012  ? CKD (chronic kidney disease) stage 3, GFR 30-59 ml/min ([place]) 04/19/2019  ? Congestive heart failure, unspecified   ? patient states she is not aware of this  ? Deep venous thrombosis of lower extremity ([place]) 06/02/2012  ? Deviated septum 01/11/2020  ? Dizziness   ? DVT (deep venous thrombosis) ([facility]) left leg  ? Epistaxis 01/11/2020  ? Essential hypertension, malignant   ? GERD (gastroesophageal reflux disease)   ? Heart murmur   ? Insulin resistance   ? History of insulin  resistance.  ? Insulin resistance   ? Long term current use of anticoagulant therapy 02/08/2014  ? coumadin   ? Obesity   ? Pure hypercholesterolemia   ? TIA (transient ischemic attack) 10/02/2018  ? Vertigo   ? ? ?Past Surgical History:  ?Procedure Laterality Date  ? ANKLE FRACTURE SURGERY Right   ? APPENDECTOMY    ? BREAST BIOPSY    ? CHOLECYSTECTOMY    ? PARTIAL HYSTERECTOMY    ? TEE WITHOUT CARDIOVERSION N/A 03/12/2021  ? Procedure:  TRANSESOPHAGEAL ECHOCARDIOGRAM (TEE);  Surgeon: Werner Lean, MD;  Location[facility] ENDOSCOPY;  Service: Cardiovascular;  Laterality: N/A;  ? ? ? reports that she has never smoked. She has never used smokeless tobacco. She reports that she does not drink alcohol and does not use drugs. ? ?Allergies  ?Allergen Reactions  ? Lovenox [Enoxaparin]   ? Norvasc [Amlodipine Besylate]   ?  Pt unsure   ? Sulfa Antibiotics   ?  Dizziness   ? Codeine Rash  ? Latex Rash  ? ? ?Family History  ?Problem Relation Age of Onset  ? Diabetes Mother   ? Hypertension Sister   ? Pulmonary embolism Sister   ? ? ?Prior to Admission medications   ?Medication Sig Start Date End Date Taking? Authorizing Provider  ?atenolol (TENORMIN) 100 MG tablet Take 100 mg by mouth daily. 12/14/20  Yes [provider]  ?ELIQUIS 2.5 MG TABS tablet Take 2.5 mg by mouth 2 (two) times daily. 08/17/19  Yes [provider]  ?EUTHYROX 25 MCG tablet Take 25 mcg by mouth daily before breakfast. 03/07/20  Yes [provider]  ?furosemide (LASIX) 80 MG tablet Take 80 mg by mouth daily.   Yes [provider]  ?irbesartan (AVAPRO) 75 MG tablet Take 1 tablet by mouth once daily ?Patient taking differently: Take 75 mg by mouth daily. 05/14/21  Yes Belva Crome, MD  ?cefpodoxime (VANTIN) 200 MG tablet Take 200 mg by mouth daily. ?Patient not taking: Reported on 03/15/2022 01/18/22   [provider]  ?doxycycline (VIBRA-TABS) 100 MG tablet Take 100 mg by mouth See admin instructions. Bid x 10 days ?Patient not taking: Reported on 04/02/2022 02/28/22   [provider]  ?metolazone (ZAROXOLYN) 5 MG tablet Take 5 mg by mouth once a week. ?Patient not taking: Reported on 03/17/2022 02/06/22   [provider]  ? ? ?Physical Exam: ?Vitals:  ? 03/24/2022 2100 03/24/2022 2135 03/09/2022 2200 03/15/2022 2230  ?BP: 135/84 (!) 145/78 134/71 138/69  ?Pulse: (!) 38 (!) 58 (!) 31 76  ?Resp: 17 18 16  (!) 21  ?Temp:      ?TempSrc:       ?SpO2: 100% 100% 100% 100%  ? ? ?Physical Exam ?Vitals reviewed.  ?Constitutional:   ?   General: She is not in acute distress. ?HENT:  ?   Head: Normocephalic and atraumatic.  ?Eyes:  ?   Extraocular Movements: Extraocular movements intact.  ?   Pupils: Pupils are equal, round, and reactive to light.  ?Cardiovascular:  ?   Rate and Rhythm: Normal rate. Rhythm irregular.  ?   Comments: Left foot cool to touch and no dorsalis pedis pulse appreciated ?Pulmonary:  ?   Effort: Pulmonary effort is normal. No respiratory distress.  ?   Breath sounds: No wheezing or rales.  ?Abdominal:  ?   General: Bowel sounds are normal. There is no distension.  ?   Palpations: Abdomen is soft.  ?   Tenderness:  There is no abdominal tenderness.  ?Musculoskeletal:  ?   Cervical back: Normal range of motion.  ?   Right lower leg: Edema present.  ?   Left lower leg: Edema present.  ?   Comments: 4+ pitting edema of bilateral lower extremities with weeping blisters ?Right lower extremity appears larger in size  ?Skin: ?   General: Skin is warm and dry.  ?   Findings: Erythema present.  ?   Comments: Bilateral lower extremities erythematous and warm to touch  ?Neurological:  ?   General: No focal deficit present.  ?   Mental Status: She is alert and oriented to person, place, and time.  ?   Cranial Nerves: No cranial nerve deficit.  ?   Sensory: No sensory deficit.  ?   Motor: No weakness.  ?  ? ? ? ?Labs on Admission: I have personally reviewed following labs and imaging studies ? ?CBC: ?Recent Labs  ?Lab 03/10/2022 ?2126 03/24/2022 ?2147  ?WBC 4.2  --   ?NEUTROABS 2.6  --   ?HGB 13.1 13.9  ?HCT 41.3 41.0  ?MCV 106.4*  --   ?PLT 73*  --   ? ?Basic Metabolic Panel: ?Recent Labs  ?Lab 03/25/2022 ?2126 03/17/2022 ?2147  ?NA 135 142  ?K 5.3* 4.2  ?CL 110 114*  ?CO2 18*  --   ?GLUCOSE 112* 110*  ?BUN 52* 66*  ?CREATININE 1.86* 2.00*  ?CALCIUM 8.5*  --   ? ?GFR: ?Estimated Creatinine Clearance: 17.8 mL/min (A) (by C-G formula based on SCr of 2 mg/dL  (H)). ?Liver Function Tests: ?Recent Labs  ?Lab 03/29/2022 ?2126  ?AST 64*  ?ALT 33  ?ALKPHOS 156*  ?BILITOT 3.1*  ?PROT 7.2  ?ALBUMIN 3.3*  ? ?No results for input(s): LIPASE, AMYLASE in the last 168 hours. ?No results

## 2022-03-24 NOTE — ED Notes (Signed)
Breakfast order placed ?

## 2022-03-24 NOTE — Consult Note (Signed)
?Cardiology Consultation:  ? ?Patient ID: Jasmine [name] ?MRN: [id number]; DOB: Jan 11, 1934 ? ?Admit date: 03/11/2022 ?Date of Consult: 03/24/2022 ? ?PCP:  [name], MD ?  ?[facility] Providers ?Cardiologist:  Sinclair Grooms, MD      ? ? ?Patient Profile:  ? ?Jasmine [name] is a 88 y.o. 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 Bonner Puna. ? ?History of Present Illness:  ? ?Jasmine [name] is an 88 year old patient of Dr. Pernell Dupre who presents today after an unresponsive episode at home with presentation of right heart failure.  Due to concern of dehydration in the ED she was given a IV fluids, on my interview she states this made her feel better.  She was able to urinate and feels that helped.  She is endorsing some abdominal pain and fullness but otherwise no current chest pain or shortness of breath.  She has grossly edematous legs with fluid-filled bulla. ? ?She follows with Dr. Tamala Julian in the clinic and was last seen February 12, 2022.  Noted to have slight increase in chronic lower extremity edema.  Intermittent metolazone dosing due to low blood pressure, at that time continued on furosemide daily and metolazone once every other week.  Noted to have a history of severe tricuspid valve regurgitation now associated with right heart failure.  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  She is noted to have rate controlled atrial fibrillation on atenolol and apixaban. ? ?Echocardiogram obtained today noted severe RV dilation with moderately reduced RV function and septal flattening consistent with RV pressure and volume overload with an RVSP of approximately 45 mmHg.  LV function grossly preserved however  LVEDP with E over E prime is estimated at 32 mmHg and E prime is approximately 3 cm/s suggesting possible restrictive myocardial process as well.  This likely contributes to her diagnosis of HFpEF. ? ?She does not recall what preceded her syncopal episode but notes that family was present and brought her in.  Currently denying chest pain. ? ?Past Medical History:  ?Diagnosis Date  ? Allergic rhinitis due to pollen   ? Atrial fibrillation ([place]) 12/23/2011  ? Atrial fibrillation with rapid ventricular response ([place])   ? Chronic diastolic HF (heart failure) ([place]) 11/21/2012  ? CKD (chronic kidney disease) stage 3, GFR 30-59 ml/min ([place]) 04/19/2019  ? Congestive heart failure, unspecified   ? patient states she is not aware of this  ? Deep venous thrombosis of lower extremity ([place]) 06/02/2012  ? Deviated septum 01/11/2020  ? Dizziness   ? DVT (deep venous thrombosis) ([facility]) left leg  ? Epistaxis 01/11/2020  ? Essential hypertension, malignant   ? GERD (gastroesophageal reflux disease)   ? Heart murmur   ? Insulin resistance   ? History of insulin resistance.  ? Insulin resistance   ? Long term current use of anticoagulant therapy 02/08/2014  ? coumadin   ? Obesity   ? Pure hypercholesterolemia   ? TIA (transient ischemic attack) 10/02/2018  ? Vertigo   ? ? ?Past Surgical History:  ?Procedure Laterality Date  ? ANKLE FRACTURE SURGERY Right   ? APPENDECTOMY    ? BREAST BIOPSY    ? CHOLECYSTECTOMY    ? PARTIAL HYSTERECTOMY    ?  TEE WITHOUT CARDIOVERSION N/A 03/12/2021  ? Procedure: TRANSESOPHAGEAL ECHOCARDIOGRAM (TEE);  Surgeon: Werner Lean, MD;  Location[facility] ENDOSCOPY;  Service: Cardiovascular;  Laterality: N/A;  ?  ? ?Home Medications:  ?Prior to Admission medications   ?Medication Sig Start Date End Date Taking? Authorizing Provider  ?atenolol (TENORMIN) 100 MG tablet Take 100 mg by mouth daily. 12/14/20  Yes [provider]  ?ELIQUIS 2.5 MG TABS tablet Take 2.5 mg by mouth 2 (two) times daily. 08/17/19  Yes  [provider]  ?EUTHYROX 25 MCG tablet Take 25 mcg by mouth daily before breakfast. 03/07/20  Yes [provider]  ?furosemide (LASIX) 80 MG tablet Take 80 mg by mouth daily.   Yes [provider]  ?irbesartan (AVAPRO) 75 MG tablet Take 1 tablet by mouth once daily ?Patient taking differently: Take 75 mg by mouth daily. 05/14/21  Yes Belva Crome, MD  ?cefpodoxime (VANTIN) 200 MG tablet Take 200 mg by mouth daily. ?Patient not taking: Reported on 03/12/2022 01/18/22   [provider]  ?doxycycline (VIBRA-TABS) 100 MG tablet Take 100 mg by mouth See admin instructions. Bid x 10 days ?Patient not taking: Reported on 04/02/2022 02/28/22   [provider]  ?metolazone (ZAROXOLYN) 5 MG tablet Take 5 mg by mouth once a week. ?Patient not taking: Reported on 03/12/2022 02/06/22   [provider]  ? ? ?Inpatient Medications: ?Scheduled Meds: ? apixaban  2.5 mg Oral BID  ? furosemide  40 mg Intravenous Once  ? levothyroxine  25 mcg Oral Q0600  ? spironolactone  25 mg Oral Daily  ? ?Continuous Infusions: ? cefTRIAXone (ROCEPHIN)  IV Stopped (03/24/22 0403)  ? ?PRN Meds: ?acetaminophen **OR** acetaminophen ? ?Allergies:    ?Allergies  ?Allergen Reactions  ? Lovenox [Enoxaparin]   ? Norvasc [Amlodipine Besylate]   ?  Pt unsure   ? Sulfa Antibiotics   ?  Dizziness   ? Codeine Rash  ? Latex Rash  ? ? ?Social History:   ?Social History  ? ?Socioeconomic History  ? Marital status: Single  ?  Spouse name: Not on file  ? Number of children: Not on file  ? Years of education: Not on file  ? Highest education level: Not on file  ?Occupational History  ? Not on file  ?Tobacco Use  ? Smoking status: Never  ? Smokeless tobacco: Never  ?Substance and Sexual Activity  ? Alcohol use: No  ? Drug use: No  ? Sexual activity: Not Currently  ?  Birth control/protection: Post-menopausal  ?Other Topics Concern  ? Not on file  ?Social History Narrative  ? Not on file  ? ?Social Determinants of Health   ? ?Financial Resource Strain: Not on file  ?Food Insecurity: Not on file  ?Transportation Needs: Not on file  ?Physical Activity: Not on file  ?Stress: Not on file  ?Social Connections: Not on file  ?Intimate Partner Violence: Not on file  ?  ?Family History:   ? ?Family History  ?Problem Relation Age of Onset  ? Diabetes Mother   ? Hypertension Sister   ? Pulmonary embolism Sister   ?  ? ?ROS:  ?Please see the history of present illness.  ? ?All other ROS reviewed and negative.    ? ?Physical Exam/Data:  ? ?Vitals:  ? 03/24/22 1425 03/24/22 1500 03/24/22 1630 03/24/22 1836  ?BP:  (!) 144/66 (!) 143/70 (!) 143/79  ?Pulse: 60 70 80 78  ?Resp: (!) 22 (!) 23 (!) 24 20  ?  Temp:      ?TempSrc:      ?SpO2: 100% 100% 99% 99%  ? ? ?Intake/Output Summary (Last 24 hours) at 03/24/2022 2129 ?Last data filed at 03/24/2022 0326 ?Gross per 24 hour  ?Intake 323.47 ml  ?Output --  ?Net 323.47 ml  ? ? ?  03/14/2022  ?  2:26 PM 02/12/2022  ?  9:54 AM 07/18/2021  ?  2:34 PM  ?Last 3 Weights  ?Weight (lbs) 140 lb 149 lb 6.4 oz 139 lb 9.6 oz  ?Weight (kg) 63.504 kg 67.767 kg 63.322 kg  ?   ?There is no height or weight on file to calculate BMI.  ?Constitutional: No acute distress ?Eyes: sclera non-icteric, normal conjunctiva and lids ?ENMT:  moist mucous membranes ?Cardiovascular: irregular rhythm, bradycardic rate, 3/6 holosystolic murmur murmur.  JVD at 60 degrees is to the mid one third of the neck with V wave ?Respiratory: Diminished in the bases bilaterally ?GI : normal bowel sounds, soft and minimally tender. No distention.   ?MSK: Warm.  Tense 2-3+ edema with fluid and blood-filled bulla ?NEURO: grossly nonfocal exam, moves all extremities. ?PSYCH: alert and oriented x 3, normal mood and affect.  ? ? ?EKG:  The EKG was personally reviewed and demonstrates: Atrial fibrillation with slow ventricular response and PVCs ?Telemetry:  Telemetry was personally reviewed and demonstrates: Atrial fibrillation with slow ventricular response  occasional PVCs ? ?Relevant CV Studies: ?Echocardiogram is noted below and in HPI ? ?Laboratory Data: ? ?High Sensitivity Troponin:   ?Recent Labs  ?Lab 03/14/22 ?1502 03/14/22 ?1723 03/29/2022 ?2126 03/24/22 ?0015

## 2022-03-24 NOTE — ED Notes (Signed)
Back from vascular

## 2022-03-24 NOTE — Progress Notes (Signed)
PROGRESS NOTE ? ?Brief Narrative: ?Jasmine [name] is an 88 y.o. female with a history of chronic AFib, chronic HFpEF, stage IV CKD, HTN, DVT, hypothyroidism, TIA, GERD, vertigo who presented to the ED 4/15 after an approximately 15 minute episode of unresponsiveness in recliner at home. She also complained of worsening tender redness and swelling in the legs bilaterally not improved despite taking an increased dose of lasix per her nephrologist and doxycycline as an outpatient. She was admitted this morning by Dr. Marlowe Sax for syncope evaluation and bilateral lower extremity cellulitis.   ? ?Subjective: ?Leg swelling is getting worse, severely tender to palpation, having some blisters open up on left leg. Usually gets around with a walker but having a harder time now for the past couple weeks. ? ?Objective: ?BP (!) 145/66   Pulse (!) 29   Temp 97.6 ?F (36.4 ?C) (Oral)   Resp (!) 21   SpO2 100%   ?Gen: Elderly alert female in no distress ?Pulm: Clear and nonlabored  ?CV: RRR, systolic murmur greatest at LSB, no rub, + JVD, severe dependent edema. ?GI: Soft, NT, ND, +BS  ?Neuro: Alert and oriented. No focal deficits. ?Skin: Clear blisters on tender erythema across bilateral lower legs. No exudate. ? ?Assessment & Plan: ?Acute on chronic right heart failure: Exacerbated by TR that appears worse on echo, severe biatrial enlargement. +JVD, dependent edema. Lungs sound clear. No DVT in LE's to explain swelling.  ?- She is quite overloaded. Stopped IV fluids. Reordered home lasix, but will cancel that in lieu of cardiology's opinion. Spoke with Dr. Margaretann Loveless whose assistance is appreciated. Has been seen by Dr. Tamala Julian previously. ?- May be difficult task to effectively diurese with CrCl ~65ml/min. May involve nephrology as well, she sees Dr. Royce Macadamia at [facility].  ?- Need a standing weight if possible and daily weights, strict I/O. ? ?Unresponsive episode: Unclear etiology. ?- Continue cardiac monitoring ? ?Chronic atrial  fibrillation: Rate is controlled in 50-70's ?- Holding atenolol from admission due to bradycardic rate ?- Continue eliquis 2.5mg  BID ? ?Hypothyroidism: TSH is 5.836 ?- Continue synthroid at current dose for now.  ? ?Nonpurulent cellulitis:  ?- Continue ceftriaxone.  ? ?Patrecia Pour, MD ?Pager on amion ?03/24/2022, 2:33 PM   ?

## 2022-03-24 NOTE — Progress Notes (Signed)
VASCULAR LAB ? ? ? ?Bilateral lower extremity venous duplex has been performed. ? ?See CV proc for preliminary results. ? ? ?Briar Sword, RVT ?03/24/2022, 10:47 AM ? ?

## 2022-03-24 NOTE — Procedures (Signed)
Routine EEG Report ? ?ADAJA [name] is a 88 y.o. female with a history of sycope who is undergoing an EEG to evaluate for seizures. ? ?Report: This EEG was acquired with electrodes placed according to the International 10-20 electrode system (including Fp1, Fp2, F3, F4, C3, C4, P3, P4, O1, O2, T3, T4, T5, T6, A1, A2, Fz, Cz, Pz). The following electrodes were missing or displaced: none. ? ?The occipital dominant rhythm was 7 Hz. This activity is reactive to stimulation. Drowsiness was manifested by background fragmentation; deeper stages of sleep were not identified. There was no focal slowing. There were no interictal epileptiform discharges. There were no electrographic seizures identified. There was no abnormal response to photic stimulation or hyperventilation.  ? ?Impression and clinical correlation: This EEG was obtained while awake and drowsy and is abnormal due to mild diffuse slowing indicative of global cerebral dysfunction. Epileptiform abnormalities were not seen during this recording. ? ?Su Monks, MD ?[facility] ?[phone] ? ?If 7pm- 7am, please page neurology on call as listed in [place]. ? ?

## 2022-03-24 NOTE — ED Notes (Signed)
Gone to vascular ? ?

## 2022-03-24 NOTE — Progress Notes (Signed)
VASCULAR LAB ? ? ? ?Carotid duplex has been performed. ? ?See CV proc for preliminary results. ? ? ?Lael Pilch, RVT ?03/24/2022, 10:48 AM ? ?

## 2022-03-24 NOTE — Progress Notes (Signed)
*  PRELIMINARY RESULTS* ?Echocardiogram ?2D Echocardiogram has been performed. ? ?[name] ?03/24/2022, 10:29 AM ?

## 2022-03-24 NOTE — Progress Notes (Signed)
EEG complete - results pending 

## 2022-03-25 DIAGNOSIS — N184 Chronic kidney disease, stage 4 (severe): Secondary | ICD-10-CM

## 2022-03-25 DIAGNOSIS — I4821 Permanent atrial fibrillation: Secondary | ICD-10-CM | POA: Diagnosis not present

## 2022-03-25 DIAGNOSIS — I5033 Acute on chronic diastolic (congestive) heart failure: Secondary | ICD-10-CM

## 2022-03-25 DIAGNOSIS — I739 Peripheral vascular disease, unspecified: Secondary | ICD-10-CM | POA: Diagnosis not present

## 2022-03-25 DIAGNOSIS — L0390 Cellulitis, unspecified: Secondary | ICD-10-CM

## 2022-03-25 DIAGNOSIS — I10 Essential (primary) hypertension: Secondary | ICD-10-CM

## 2022-03-25 DIAGNOSIS — R55 Syncope and collapse: Secondary | ICD-10-CM | POA: Diagnosis not present

## 2022-03-25 LAB — BASIC METABOLIC PANEL
Anion gap: 8 (ref 5–15)
BUN: 51 mg/dL — ABNORMAL HIGH (ref 8–23)
CO2: 20 mmol/L — ABNORMAL LOW (ref 22–32)
Calcium: 9 mg/dL (ref 8.9–10.3)
Chloride: 112 mmol/L — ABNORMAL HIGH (ref 98–111)
Creatinine, Ser: 1.82 mg/dL — ABNORMAL HIGH (ref 0.44–1.00)
GFR, Estimated: 27 mL/min — ABNORMAL LOW (ref >60)
Glucose, Bld: 130 mg/dL — ABNORMAL HIGH (ref 70–99)
Potassium: 4 mmol/L (ref 3.5–5.1)
Sodium: 140 mmol/L (ref 135–145)

## 2022-03-25 LAB — BILIRUBIN, FRACTIONATED(TOT/DIR/INDIR)
Bilirubin, Direct: 0.7 mg/dL — ABNORMAL HIGH (ref 0.0–0.2)
Indirect Bilirubin: 1.1 mg/dL — ABNORMAL HIGH (ref 0.3–0.9)
Total Bilirubin: 1.8 mg/dL — ABNORMAL HIGH (ref 0.3–1.2)

## 2022-03-25 LAB — FOLATE: Folate: 13.2 ng/mL (ref >5.9)

## 2022-03-25 LAB — IMMATURE PLATELET FRACTION: Immature Platelet Fraction: 10.2 % — ABNORMAL HIGH (ref 1.2–8.6)

## 2022-03-25 LAB — VITAMIN B12: Vitamin B-12: 866 pg/mL (ref 180–914)

## 2022-03-25 MED ORDER — SODIUM CHLORIDE 0.9 % IV SOLN
INTRAVENOUS | Status: DC | PRN
Start: 2022-03-25 — End: 2022-04-06
  Administered 2022-03-25: 10 mL via INTRAVENOUS

## 2022-03-25 NOTE — Progress Notes (Signed)
? ?  VASCULAR SURGERY ASSESSMENT & PLAN:  ? ?COMBINED CHRONIC VENOUS INSUFFICIENCY AND PERIPHERAL ARTERIAL DISEASE: This patient has chronic severe infrainguinal arterial occlusive disease as documented on her Doppler studies a year ago.  I do not think there is been any significant change in her arterial status.  She also has chronic venous insufficiency.  Given her arterial insufficiency she can only use some elevation and mild compression.  No further vascular work-up indicated at this point.  Vascular surgery will be available as needed.  I will arrange follow-up with me in 3 months. ? ?SUBJECTIVE:  ? ?No complaints. ? ?PHYSICAL EXAM:  ? ?Vitals:  ? 03/25/22 0447 03/25/22 0805 03/25/22 0807 03/25/22 1135  ?BP: (!) 148/73 (!) 149/74  132/81  ?Pulse: 64 71  66  ?Resp:  17  18  ?Temp: 97.9 ?F (36.6 ?C)  97.9 ?F (36.6 ?C) 97.9 ?F (36.6 ?C)  ?TempSrc: Oral Oral  Oral  ?SpO2:  99%  100%  ?Weight: 70.3 kg     ? ?Both feet appear adequately perfused. ?Her lower extremity swelling has improved. ? ?LABS:  ? ?Lab Results  ?Component Value Date  ? WBC 5.4 03/24/2022  ? HGB 12.8 03/24/2022  ? HCT 42.0 03/24/2022  ? MCV 108.5 (H) 03/24/2022  ? PLT 62 (L) 03/24/2022  ? ?Lab Results  ?Component Value Date  ? CREATININE 1.82 (H) 03/25/2022  ? ?Lab Results  ?Component Value Date  ? INR 1.8 (H) 04/03/2022  ? ? ?PROBLEM LIST:   ? ?Principal Problem: ?  Syncope ?Active Problems: ?  Atrial fibrillation ([place]) ?  Hypertension ?  Acute on chronic diastolic CHF (congestive heart failure) ([place]) ?  Stage 4 chronic kidney disease ([place]) ?  Cellulitis ? ? ?CURRENT MEDS:  ? ? apixaban  2.5 mg Oral BID  ? levothyroxine  25 mcg Oral Q0600  ? spironolactone  25 mg Oral Daily  ? ? ?Deitra Mayo ?Office: [phone] ?03/25/2022 ? ?

## 2022-03-25 NOTE — Progress Notes (Signed)
? ?Progress Note ? ?Patient Name: Jasmine [name] ?Date of Encounter: 03/25/2022 ? ?[facility] Cardiologist: Sinclair Grooms, MD  ? ?Subjective  ? ?Able to lie fully supine in bed without respiratory difficulty.  Legs are still swollen and tender.  She reports that she had weeping from fluid-filled bullae.  This appears to be improving. ?Net diuresis 1 liter over last 24h. ?Permanent atrial fibrillation is well rate controlled. ? ?Inpatient Medications  ?  ?Scheduled Meds: ? apixaban  2.5 mg Oral BID  ? levothyroxine  25 mcg Oral Q0600  ? spironolactone  25 mg Oral Daily  ? ?Continuous Infusions: ? sodium chloride 10 mL (03/25/22 0222)  ? cefTRIAXone (ROCEPHIN)  IV 1 g (03/25/22 0225)  ? ?PRN Meds: ?sodium chloride, acetaminophen **OR** acetaminophen  ? ?Vital Signs  ?  ?Vitals:  ? 03/24/22 2337 03/25/22 0447 03/25/22 0805 03/25/22 0807  ?BP: (!) 145/86 (!) 148/73 (!) 149/74   ?Pulse: 60 64 71   ?Resp: 19  17   ?Temp: 98.2 ?F (36.8 ?C) 97.9 ?F (36.6 ?C)  97.9 ?F (36.6 ?C)  ?TempSrc: Oral Oral Oral   ?SpO2: 100%  99%   ?Weight:  70.3 kg    ? ? ?Intake/Output Summary (Last 24 hours) at 03/25/2022 0937 ?Last data filed at 03/25/2022 0450 ?Gross per 24 hour  ?Intake 230.46 ml  ?Output 1600 ml  ?Net -1369.54 ml  ? ? ?  03/25/2022  ?  4:47 AM 03/14/2022  ?  2:26 PM 02/12/2022  ?  9:54 AM  ?Last 3 Weights  ?Weight (lbs) 154 lb 15.7 oz 140 lb 149 lb 6.4 oz  ?Weight (kg) 70.3 kg 63.504 kg 67.767 kg  ?   ? ?Telemetry  ?  ?Atrial fibrillation, well rate controlled- Personally Reviewed ? ?ECG  ?  ?Atrial fibrillation, right axis deviation, nonspecific ST-T wave flattening throughout most leads- Personally Reviewed ? ?Physical Exam  ?Lying supine in bed without discomfort ?GEN: No acute distress.   ?Neck: No JVD ?Cardiac: Irregular, 4-8/1 holosystolic blowing murmur heard broadly at the right and left lower sternal border, no diastolic murmurs, rubs, or gallops.  ?Respiratory: Clear to auscultation bilaterally. ?GI: Soft,  nontender, non-distended  ?MS: Soft bilateral 2+ pedal and ankle edema, no active weeping at this time; No deformity. ?Neuro:  Nonfocal  ?Psych: Normal affect  ? ?Labs  ?  ?High Sensitivity Troponin:   ?Recent Labs  ?Lab 03/14/22 ?1502 03/14/22 ?1723 03/31/2022 ?2126 03/24/22 ?0015  ?TROPONINIHS 29* 27* 33* 33*  ?   ?Chemistry ?Recent Labs  ?Lab 03/25/2022 ?2126 03/30/2022 ?2147 03/24/22 ?0725 03/25/22 ?0430  ?NA 135 142 143 140  ?K 5.3* 4.2 3.8 4.0  ?CL 110 114* 116* 112*  ?CO2 18*  --  16* 20*  ?GLUCOSE 112* 110* 91 130*  ?BUN 52* 66* 48* 51*  ?CREATININE 1.86* 2.00* 1.74* 1.82*  ?CALCIUM 8.5*  --  9.0 9.0  ?PROT 7.2  --  7.0  --   ?ALBUMIN 3.3*  --  3.0*  --   ?AST 64*  --  43*  --   ?ALT 33  --  26  --   ?ALKPHOS 156*  --  131*  --   ?BILITOT 3.1*  --  2.9* 1.8*  ?GFRNONAA 26*  --  28* 27*  ?ANIONGAP 7  --  11 8  ?  ?Lipids No results for input(s): CHOL, TRIG, HDL, LABVLDL, LDLCALC, CHOLHDL in the last 168 hours.  ?Hematology ?Recent Labs  ?Lab 03/29/2022 ?2126  03/10/2022 ?2147 03/24/22 ?0725  ?WBC 4.2  --  5.4  ?RBC 3.88  --  3.87  ?HGB 13.1 13.9 12.8  ?HCT 41.3 41.0 42.0  ?MCV 106.4*  --  108.5*  ?MCH 33.8  --  33.1  ?MCHC 31.7  --  30.5  ?RDW 17.2*  --  17.2*  ?PLT 73*  --  62*  ? ?Thyroid  ?Recent Labs  ?Lab 03/24/22 ?0725  ?TSH 5.836*  ?  ?BNP ?Recent Labs  ?Lab 03/20/2022 ?2126  ?BNP 1,555.0*  ?  ?DDimer No results for input(s): DDIMER in the last 168 hours.  ? ?Radiology  ?  ?CT HEAD WO CONTRAST (5MM) ? ?Result Date: 03/12/2022 ?CLINICAL DATA:  Altered mental status. EXAM: CT HEAD WITHOUT CONTRAST TECHNIQUE: Contiguous axial images were obtained from the base of the skull through the vertex without intravenous contrast. RADIATION DOSE REDUCTION: This exam was performed according to the departmental dose-optimization program which includes automated exposure control, adjustment of the mA and/or kV according to patient size and/or use of iterative reconstruction technique. COMPARISON:  October 02, 2018 FINDINGS: Brain:  There is mild cerebral atrophy with widening of the extra-axial spaces and ventricular dilatation. There are areas of decreased attenuation within the white matter tracts of the supratentorial brain, consistent with microvascular disease changes. A small, stable dural calcification is seen along the left frontal lobe. Vascular: No hyperdense vessel or unexpected calcification. Skull: Normal. Negative for fracture or focal lesion. Sinuses/Orbits: A small sphenoid sinus air-fluid level is noted. Other: None. IMPRESSION: 1. No acute intracranial abnormality. 2. Generalized cerebral atrophy with chronic white matter small vessel ischemic changes. Electronically Signed   By: Virgina Norfolk M.D.   On: 03/27/2022 21:46  ? ?DG Chest Portable 1 View ? ?Result Date: 03/21/2022 ?CLINICAL DATA:  Shortness of breath EXAM: PORTABLE CHEST 1 VIEW COMPARISON:  03/14/2022, 02/20/2021 FINDINGS: Moderate to marked cardiomegaly with globular cardiac configuration. No edema, pleural effusion or focal airspace opacity. Aortic atherosclerosis IMPRESSION: Moderate to marked cardiomegaly with globular cardiac configuration which may be due to multi chamber enlargement versus pericardial effusion. No edema. Electronically Signed   By: Donavan Foil M.D.   On: 03/26/2022 21:01  ? ?EEG adult ? ?Result Date: 03/24/2022 ?Derek Jack, MD     03/24/2022  3:40 PM Routine EEG Report Jasmine [name] is a 88 y.o. female with a history of sycope who is undergoing an EEG to evaluate for seizures. Report: This EEG was acquired with electrodes placed according to the International 10-20 electrode system (including Fp1, Fp2, F3, F4, C3, C4, P3, P4, O1, O2, T3, T4, T5, T6, A1, A2, Fz, Cz, Pz). The following electrodes were missing or displaced: none. The occipital dominant rhythm was 7 Hz. This activity is reactive to stimulation. Drowsiness was manifested by background fragmentation; deeper stages of sleep were not identified. There was no focal  slowing. There were no interictal epileptiform discharges. There were no electrographic seizures identified. There was no abnormal response to photic stimulation or hyperventilation. Impression and clinical correlation: This EEG was obtained while awake and drowsy and is abnormal due to mild diffuse slowing indicative of global cerebral dysfunction. Epileptiform abnormalities were not seen during this recording. Su Monks, MD [facility] [phone] If 7pm- 7am, please page neurology on call as listed in [place].  ? ?ECHOCARDIOGRAM COMPLETE ? ?Result Date: 03/24/2022 ?   ECHOCARDIOGRAM REPORT   Patient Name:   KAILA DEVRIES Date of Exam: 03/24/2022 Medical Rec #:  [id number]  Height:       64.0 in Accession #:    [id number]       Weight:       140.0 lb Date of Birth:  05-16-1934        BSA:          1.681 m? Patient Age:    25 years         BP:           138/69 mmHg Patient Gender: F                HR:           62 bpm. Exam Location:  Inpatient Procedure: 2D Echo, Cardiac Doppler and Color Doppler Indications:    R55 Syncope  History:        Patient has prior history of Echocardiogram examinations, most                 recent 03/12/2021. CHF, TIA, Arrythmias:Atrial Fibrillation; Risk                 Factors:Hypertension and Dyslipidemia. Long term current use of                 anticoagulant therapy, CKD (chronic kidney disease) stage 3, GFR                 30-59 ml/min ([place]).  Sonographer:    Alvino Chapel RCS Referring Phys: [id number] [place]  1. Left ventricular ejection fraction, by estimation, is 55 to 60%. The left ventricle has normal function. The left ventricle has no regional wall motion abnormalities. There is mild left ventricular hypertrophy. Left ventricular diastolic parameters are indeterminate.  2. Ventricular septum is flattened in systole and diastole consistent with RV pressure and volume overload. . Right ventricular systolic function is moderately reduced.  The right ventricular size is severely enlarged. There is mildly elevated pulmonary artery systolic pressure.  3. Left atrial size was severely dilated.  4. Right atrial size was severely dilated.  5. A small p

## 2022-03-25 NOTE — Progress Notes (Signed)
?Progress Note ? ?Patient: Jasmine [name]:[id number] DOB: Feb 29, 1934  ?DOA: 04/02/2022  DOS: 03/25/2022  ?  ?Brief hospital course: ?LORITA FORINASH is an 88 y.o. female with a history of chronic AFib, chronic HFpEF, stage IV CKD, HTN, DVT, hypothyroidism, TIA, GERD, vertigo who presented to the ED 4/15 after an approximately 15 minute episode of unresponsiveness in recliner at home. She also complained of worsening tender redness and swelling in the legs bilaterally not improved despite taking an increased dose of lasix per her nephrologist and doxycycline as an outpatient. She was admitted for syncope evaluation and bilateral lower extremity cellulitis. Exam was consistent with peripheral volume overload and echocardiogram revealed right heart failure. Cardiology was consulted to assist with management and measured diuresis is ongoing. ? ?Assessment and Plan: ?Acute on chronic right heart failure: Exacerbated by TR that appears worse on echo, severe biatrial enlargement. +JVD, dependent edema. Lungs sound clear. No DVT in LE's. ?- Continue diuresis with with spironolactone added 4/16, holding IV lasix today. Plan to target euvolemia slowly without precipitously dropping preload. Appreciate cardiology assistance.  ?- Monitoring daily weights, strict I/O. ? ?Stage 4 CKD: CrCl hovering in low 20's ml/min.  ?- Monitor metabolic panel daily. Does not appear to currently require dialysis, sees Dr. Royce Macadamia at [facility].  ? ?PVD: Arterial insufficiency in lower extremities likely stable per vascular surgery. Also with venous insufficiency, though with atherosclerotic burden, cannot use too much compression.  ?- Elevate extremities ?- Anticoagulation ? ?Unresponsive episode: Unclear etiology, possibly due to decreased cerebral perfusion due to low output/R heart failure.  ?- Continue cardiac monitoring ? ?Permanent atrial fibrillation: Rate is controlled in 50-70's ?- Holding atenolol from admission due to bradycardic rate ?-  Continue eliquis 2.5mg  BID ?  ?Hypothyroidism: TSH is 5.836 ?- Continue synthroid at current dose for now.  ?  ?Nonpurulent cellulitis:  ?- Continue ceftriaxone.  ? ?Subjective: Pain in legs is improved but still moderate, worse with palpation.  ? ?Objective: ?Vitals:  ? 03/25/22 0447 03/25/22 0805 03/25/22 0807 03/25/22 1135  ?BP: (!) 148/73 (!) 149/74  132/81  ?Pulse: 64 71  66  ?Resp:  17  18  ?Temp: 97.9 ?F (36.6 ?C)  97.9 ?F (36.6 ?C) 97.9 ?F (36.6 ?C)  ?TempSrc: Oral Oral  Oral  ?SpO2:  99%  100%  ?Weight: 70.3 kg     ? ?Gen: 88 y.o. female in no distress ?Pulm: Nonlabored breathing room air. Clear ?CV: Irreg irreg with II/VI, maybe II/VI holosystolic murmur at LSB without rub, or gallop. + JVD, + improved dependent edema. ?GI: Abdomen soft, non-tender, non-distended, with normoactive bowel sounds.  ?Ext: Warm, no deformities ?Skin: Tender erythema with swelling affecting both lower extremities, intact and unroofed bullae on left. No exudate.  ?Neuro: Alert and oriented. No focal neurological deficits. ?Psych: Judgement and insight appear fair. Mood euthymic & affect congruent. Behavior is appropriate.   ? ?Data Personally reviewed: ? ?CBC: ?Recent Labs  ?Lab 04/05/2022 ?2126 03/30/2022 ?2147 03/24/22 ?0725  ?WBC 4.2  --  5.4  ?NEUTROABS 2.6  --   --   ?HGB 13.1 13.9 12.8  ?HCT 41.3 41.0 42.0  ?MCV 106.4*  --  108.5*  ?PLT 73*  --  62*  ? ?Basic Metabolic Panel: ?Recent Labs  ?Lab 03/25/2022 ?2126 03/29/2022 ?2147 03/24/22 ?0725 03/25/22 ?0430  ?NA 135 142 143 140  ?K 5.3* 4.2 3.8 4.0  ?CL 110 114* 116* 112*  ?CO2 18*  --  16* 20*  ?GLUCOSE 112* 110* 91  130*  ?BUN 52* 66* 48* 51*  ?CREATININE 1.86* 2.00* 1.74* 1.82*  ?CALCIUM 8.5*  --  9.0 9.0  ? ?GFR: ?Estimated Creatinine Clearance: 21.7 mL/min (A) (by C-G formula based on SCr of 1.82 mg/dL (H)). ?Liver Function Tests: ?Recent Labs  ?Lab 03/10/2022 ?2126 03/24/22 ?0725 03/25/22 ?0430  ?AST 64* 43*  --   ?ALT 33 26  --   ?ALKPHOS 156* 131*  --   ?BILITOT 3.1* 2.9* 1.8*   ?PROT 7.2 7.0  --   ?ALBUMIN 3.3* 3.0*  --   ? ?No results for input(s): LIPASE, AMYLASE in the last 168 hours. ?No results for input(s): AMMONIA in the last 168 hours. ?Coagulation Profile: ?Recent Labs  ?Lab 03/22/2022 ?2126  ?INR 1.8*  ? ?Cardiac Enzymes: ?No results for input(s): CKTOTAL, CKMB, CKMBINDEX, TROPONINI in the last 168 hours. ?BNP (last 3 results) ?No results for input(s): PROBNP in the last 8760 hours. ?HbA1C: ?No results for input(s): HGBA1C in the last 72 hours. ?CBG: ?No results for input(s): GLUCAP in the last 168 hours. ?Lipid Profile: ?No results for input(s): CHOL, HDL, LDLCALC, TRIG, CHOLHDL, LDLDIRECT in the last 72 hours. ?Thyroid Function Tests: ?Recent Labs  ?  03/24/22 ?0725  ?TSH 5.836*  ? ?Anemia Panel: ?Recent Labs  ?  03/25/22 ?0430  ?[id number]  ?FOLATE 13.2  ? ?Urine analysis: ?   ?Component Value Date/Time  ? COLORURINE AMBER (A) 03/24/2022 0328  ? APPEARANCEUR HAZY (A) 03/24/2022 0328  ? LABSPEC 1.024 03/24/2022 0328  ? PHURINE 5.0 03/24/2022 0328  ? GLUCOSEU NEGATIVE 03/24/2022 0328  ? [place] NEGATIVE 03/24/2022 0328  ? [place] NEGATIVE 03/24/2022 0328  ? [place] NEGATIVE 03/24/2022 0328  ? PROTEINUR 30 (A) 03/24/2022 0328  ? NITRITE NEGATIVE 03/24/2022 0328  ? LEUKOCYTESUR NEGATIVE 03/24/2022 0328  ? ?Recent Results (from the past 240 hour(s))  ?Resp Panel by RT-PCR (Flu A&B, Covid) Nasopharyngeal Swab     Status: None  ? Collection Time: 03/13/2022  9:26 PM  ? Specimen: Nasopharyngeal Swab; Nasopharyngeal(NP) swabs in vial transport medium  ?Result Value Ref Range Status  ? SARS Coronavirus 2 by RT PCR NEGATIVE NEGATIVE Final  ?  Comment: (NOTE) ?SARS-CoV-2 target nucleic acids are NOT DETECTED. ? ?The SARS-CoV-2 RNA is generally detectable in upper respiratory ?specimens during the acute phase of infection. The lowest ?concentration of SARS-CoV-2 viral copies this assay can detect is ?138 copies/mL. A negative result does not preclude SARS-Cov-2 ?infection and should  not be used as the sole basis for treatment or ?other patient management decisions. A negative result may occur with  ?improper specimen collection/handling, submission of specimen other ?than nasopharyngeal swab, presence of viral mutation(s) within the ?areas targeted by this assay, and inadequate number of viral ?copies(<138 copies/mL). A negative result must be combined with ?clinical observations, patient history, and epidemiological ?information. The expected result is Negative. ? ?Fact Sheet for Patients:  ?EntrepreneurPulse.com.au ? ?Fact Sheet for Healthcare Providers:  ?IncredibleEmployment.be ? ?This test is no t yet approved or cleared by the [place] FDA and  ?has been authorized for detection and/or diagnosis of SARS-CoV-2 by ?FDA under an Emergency Use Authorization (EUA). This EUA will remain  ?in effect (meaning this test can be used) for the duration of the ?COVID-19 declaration under Section 564(b)(1) of the Act, 21 ?U.S.C.section 360bbb-3(b)(1), unless the authorization is terminated  ?or revoked sooner.  ? ? ?  ? Influenza A by PCR NEGATIVE NEGATIVE Final  ? Influenza B by PCR NEGATIVE NEGATIVE Final  ?  Comment: (NOTE) ?The Xpert Xpress SARS-CoV-2/FLU/RSV plus assay is intended as an aid ?in the diagnosis of influenza from Nasopharyngeal swab specimens and ?should not be used as a sole basis for treatment. Nasal washings and ?aspirates are unacceptable for Xpert Xpress SARS-CoV-2/FLU/RSV ?testing. ? ?Fact Sheet for Patients: ?EntrepreneurPulse.com.au ? ?Fact Sheet for Healthcare Providers: ?IncredibleEmployment.be ? ?This test is not yet approved or cleared by the [place] FDA and ?has been authorized for detection and/or diagnosis of SARS-CoV-2 by ?FDA under an Emergency Use Authorization (EUA). This EUA will remain ?in effect (meaning this test can be used) for the duration of the ?COVID-19 declaration under  Section 564(b)(1) of the Act, 21 U.S.C. ?section 360bbb-3(b)(1), unless the authorization is terminated or ?revoked. ? ?Performed at [facility], [place] [address], [place], [place] ?[address] ?  ?

## 2022-03-26 DIAGNOSIS — I5033 Acute on chronic diastolic (congestive) heart failure: Secondary | ICD-10-CM | POA: Diagnosis not present

## 2022-03-26 DIAGNOSIS — L0390 Cellulitis, unspecified: Secondary | ICD-10-CM | POA: Diagnosis not present

## 2022-03-26 DIAGNOSIS — I4821 Permanent atrial fibrillation: Secondary | ICD-10-CM | POA: Diagnosis not present

## 2022-03-26 DIAGNOSIS — R55 Syncope and collapse: Secondary | ICD-10-CM | POA: Diagnosis not present

## 2022-03-26 LAB — BASIC METABOLIC PANEL
Anion gap: 13 (ref 5–15)
BUN: 44 mg/dL — ABNORMAL HIGH (ref 8–23)
CO2: 18 mmol/L — ABNORMAL LOW (ref 22–32)
Calcium: 8.6 mg/dL — ABNORMAL LOW (ref 8.9–10.3)
Chloride: 105 mmol/L (ref 98–111)
Creatinine, Ser: 1.72 mg/dL — ABNORMAL HIGH (ref 0.44–1.00)
GFR, Estimated: 29 mL/min — ABNORMAL LOW (ref >60)
Glucose, Bld: 116 mg/dL — ABNORMAL HIGH (ref 70–99)
Potassium: 3.7 mmol/L (ref 3.5–5.1)
Sodium: 136 mmol/L (ref 135–145)

## 2022-03-26 MED ORDER — FUROSEMIDE 40 MG PO TABS
40.0000 mg | ORAL_TABLET | Freq: Every day | ORAL | Status: DC
  Administered 2022-03-26 – 2022-03-28 (×3): 40 mg via ORAL
  Filled 2022-03-26 (×3): qty 1

## 2022-03-26 MED ORDER — AQUAPHOR EX OINT
TOPICAL_OINTMENT | Freq: Every day | CUTANEOUS | Status: DC
  Filled 2022-03-26: qty 50

## 2022-03-26 NOTE — Progress Notes (Signed)
?Progress Note ? ?Patient: Jasmine [name] MBT:[id number] DOB: 1933/12/18  ?DOA: 03/15/2022  DOS: 03/26/2022  ?  ?Brief hospital course: ?88 y.o. female with a history of chronic AFib Chad2vasc2=4-5,Chronic HFpEF, stage IV CKD, HTN, DVT, hypothyroidism, TIA, GERD, vertigo presented to ED 4/15 after an approximately 15 minute episode of unresponsiveness in recliner at home.  ?worsening tender redness and swelling in the legs bilaterally not improved despite taking an increased dose of lasix per her nephrologist and doxycycline as an outpatient. S ? ?he was admitted for syncope evaluation and bilateral lower extremity cellulitis.  ? ?Exam was consistent with peripheral volume overload and echocardiogram revealed right heart failure.  ?Cardiology  consulted to assist with management and measured diuresis is ongoing. ? ?Assessment and Plan: ?Acute on chronic right heart failure: Exacerbated by TR that appears worse on echo, severe biatrial enlargement. +JVD, dependent edema. Lungs sound clear. No DVT in LE's. ?- GDMT spironolactone 25 mg, lasix 40 po [not 80--home dose] ?-Plan to target euvolemia slowly without precipitously dropping preload ?- -1.59 liters, weight inaccurate ? ?Stage 4 CKD: CrCl hovering in low 20's ml/min.  ?- Monitor metabolic panel daily. Does not appear to currently require dialysis, sees Dr. Royce Macadamia at [facility].  ? ?PVD: Arterial insufficiency in lower extremities likely stable per vascular surgery. Also with venous insufficiency, though with atherosclerotic burden, cannot use too much compression.  ?- Elevate extremities ?- Anticoagulation ? ?Unresponsive episode: Unclear etiology, possibly due to decreased cerebral perfusion due to low output/R heart failure.  ?- Continue cardiac monitoring ?- monitors show afib rate control + PVC's ?- given she is coherent alert oriented, no further work-up ? ?Permanent atrial fibrillation: Rate is controlled in 50-70's ?- Holding atenolol from admission due to  bradycardic rate ?- Continue eliquis 2.5mg  BID ?  ?Hypothyroidism: TSH is 5.836 ?- Continue synthroid 61mcg at current dose for now.  ?  ?Nonpurulent cellulitis:  ?- Continue ceftriaxone.  ?- aquafor dressing with kerlix , gently wrap as wound very tender ? ?Subjective: ? ?Pain in legs is ok--she has hyperalgesia in  feet ?Breathing some better ? ? ?Objective: ?Vitals:  ? 03/26/22 0533 03/26/22 0829 03/26/22 1224 03/26/22 1349  ?BP: (!) 118/50 131/63 130/81 130/84  ?Pulse: 75 100 78 85  ?Resp: 18 19 17    ?Temp: 97.6 ?F (36.4 ?C)  97.8 ?F (36.6 ?C)   ?TempSrc: Oral  Oral   ?SpO2: 100% 100% 100% 99%  ?Weight: 71.6 kg     ? ?Awake coherent in and no focal deficit-frail weak ?Cta b no added sound ?Some JVD ?S1 S2 no m/r/g ?Abd soft nt dn no rebound no guard ?Neuro intact no focal deficit ?Wound son LE's weepy--very hyperalgesic ? ?Data Personally reviewed: ? ?CBC: ?Recent Labs  ?Lab 04/03/2022 ?2126 03/21/2022 ?2147 03/24/22 ?0725  ?WBC 4.2  --  5.4  ?NEUTROABS 2.6  --   --   ?HGB 13.1 13.9 12.8  ?HCT 41.3 41.0 42.0  ?MCV 106.4*  --  108.5*  ?PLT 73*  --  62*  ? ? ?Basic Metabolic Panel: ?Recent Labs  ?Lab 03/14/2022 ?2126 03/13/2022 ?2147 03/24/22 ?0725 03/25/22 ?0430 03/26/22 ?5364  ?NA 135 142 143 140 136  ?K 5.3* 4.2 3.8 4.0 3.7  ?CL 110 114* 116* 112* 105  ?CO2 18*  --  16* 20* 18*  ?GLUCOSE 112* 110* 91 130* 116*  ?BUN 52* 66* 48* 51* 44*  ?CREATININE 1.86* 2.00* 1.74* 1.82* 1.72*  ?CALCIUM 8.5*  --  9.0 9.0 8.6*  ? ? ?  GFR: ?Estimated Creatinine Clearance: 23.2 mL/min (A) (by C-G formula based on SCr of 1.72 mg/dL (H)). ?Liver Function Tests: ?Recent Labs  ?Lab 04/02/2022 ?2126 03/24/22 ?0725 03/25/22 ?0430  ?AST 64* 43*  --   ?ALT 33 26  --   ?ALKPHOS 156* 131*  --   ?BILITOT 3.1* 2.9* 1.8*  ?PROT 7.2 7.0  --   ?ALBUMIN 3.3* 3.0*  --   ? ? ?No results for input(s): LIPASE, AMYLASE in the last 168 hours. ?No results for input(s): AMMONIA in the last 168 hours. ?Coagulation Profile: ?Recent Labs  ?Lab 03/15/2022 ?2126  ?INR  1.8*  ? ? ?Cardiac Enzymes: ?No results for input(s): CKTOTAL, CKMB, CKMBINDEX, TROPONINI in the last 168 hours. ?BNP (last 3 results) ?No results for input(s): PROBNP in the last 8760 hours. ?HbA1C: ?No results for input(s): HGBA1C in the last 72 hours. ?CBG: ?No results for input(s): GLUCAP in the last 168 hours. ?Lipid Profile: ?No results for input(s): CHOL, HDL, LDLCALC, TRIG, CHOLHDL, LDLDIRECT in the last 72 hours. ?Thyroid Function Tests: ?Recent Labs  ?  03/24/22 ?0725  ?TSH 5.836*  ? ? ?Anemia Panel: ?Recent Labs  ?  03/25/22 ?0430  ?[id number]  ?FOLATE 13.2  ? ? ?Urine analysis: ?   ?Component Value Date/Time  ? COLORURINE AMBER (A) 03/24/2022 0328  ? APPEARANCEUR HAZY (A) 03/24/2022 0328  ? LABSPEC 1.024 03/24/2022 0328  ? PHURINE 5.0 03/24/2022 0328  ? GLUCOSEU NEGATIVE 03/24/2022 0328  ? [place] NEGATIVE 03/24/2022 0328  ? [place] NEGATIVE 03/24/2022 0328  ? [place] NEGATIVE 03/24/2022 0328  ? PROTEINUR 30 (A) 03/24/2022 0328  ? NITRITE NEGATIVE 03/24/2022 0328  ? LEUKOCYTESUR NEGATIVE 03/24/2022 0328  ? ?Recent Results (from the past 240 hour(s))  ?Resp Panel by RT-PCR (Flu A&B, Covid) Nasopharyngeal Swab     Status: None  ? Collection Time: 04/03/2022  9:26 PM  ? Specimen: Nasopharyngeal Swab; Nasopharyngeal(NP) swabs in vial transport medium  ?Result Value Ref Range Status  ? SARS Coronavirus 2 by RT PCR NEGATIVE NEGATIVE Final  ?  Comment: (NOTE) ?SARS-CoV-2 target nucleic acids are NOT DETECTED. ? ?The SARS-CoV-2 RNA is generally detectable in upper respiratory ?specimens during the acute phase of infection. The lowest ?concentration of SARS-CoV-2 viral copies this assay can detect is ?138 copies/mL. A negative result does not preclude SARS-Cov-2 ?infection and should not be used as the sole basis for treatment or ?other patient management decisions. A negative result may occur with  ?improper specimen collection/handling, submission of specimen other ?than nasopharyngeal swab, presence of  viral mutation(s) within the ?areas targeted by this assay, and inadequate number of viral ?copies(<138 copies/mL). A negative result must be combined with ?clinical observations, patient history, and epidemiological ?information. The expected result is Negative. ? ?Fact Sheet for Patients:  ?EntrepreneurPulse.com.au ? ?Fact Sheet for Healthcare Providers:  ?IncredibleEmployment.be ? ?This test is no t yet approved or cleared by the [place] FDA and  ?has been authorized for detection and/or diagnosis of SARS-CoV-2 by ?FDA under an Emergency Use Authorization (EUA). This EUA will remain  ?in effect (meaning this test can be used) for the duration of the ?COVID-19 declaration under Section 564(b)(1) of the Act, 21 ?U.S.C.section 360bbb-3(b)(1), unless the authorization is terminated  ?or revoked sooner.  ? ? ?  ? Influenza A by PCR NEGATIVE NEGATIVE Final  ? Influenza B by PCR NEGATIVE NEGATIVE Final  ?  Comment: (NOTE) ?The Xpert Xpress SARS-CoV-2/FLU/RSV plus assay is intended as an aid ?in the diagnosis of  influenza from Nasopharyngeal swab specimens and ?should not be used as a sole basis for treatment. Nasal washings and ?aspirates are unacceptable for Xpert Xpress SARS-CoV-2/FLU/RSV ?testing. ? ?Fact Sheet for Patients: ?EntrepreneurPulse.com.au ? ?Fact Sheet for Healthcare Providers: ?IncredibleEmployment.be ? ?This test is not yet approved or cleared by the [place] FDA and ?has been authorized for detection and/or diagnosis of SARS-CoV-2 by ?FDA under an Emergency Use Authorization (EUA). This EUA will remain ?in effect (meaning this test can be used) for the duration of the ?COVID-19 declaration under Section 564(b)(1) of the Act, 21 U.S.C. ?section 360bbb-3(b)(1), unless the authorization is terminated or ?revoked. ? ?Performed at [facility], [place] [address] [place], [place] ?[address] ?  ?   ?No results  found. ? ?Family Communication: None at bedside ? ?Disposition: ?Status is: Inpatient ?Remains inpatient appropriate because: IV diuresis for acute right heart failure requiring close monitoring due to stage 4 CKD ?Planned Sharma Covert

## 2022-03-26 NOTE — Progress Notes (Addendum)
? ?Progress Note ? ?Patient Name: Jasmine [name] ?Date of Encounter: 03/26/2022 ? ?[facility] Cardiologist: Sinclair Grooms, MD  ? ?Subjective  ? ?Breathing is ok. Occasional dizziness.  ? ?Inpatient Medications  ?  ?Scheduled Meds: ? apixaban  2.5 mg Oral BID  ? levothyroxine  25 mcg Oral Q0600  ? spironolactone  25 mg Oral Daily  ? ?Continuous Infusions: ? sodium chloride 10 mL (03/25/22 0222)  ? cefTRIAXone (ROCEPHIN)  IV 1 g (03/26/22 0159)  ? ?PRN Meds: ?sodium chloride, acetaminophen **OR** acetaminophen  ? ?Vital Signs  ?  ?Vitals:  ? 03/25/22 2119 03/26/22 0032 03/26/22 0533 03/26/22 0829  ?BP: 132/66 (!) 149/74 (!) 118/50 131/63  ?Pulse: 76 (!) 49 75 100  ?Resp: 20 19 18 19   ?Temp: 97.7 ?F (36.5 ?C) 97.9 ?F (36.6 ?C) 97.6 ?F (36.4 ?C)   ?TempSrc: Oral Oral Oral   ?SpO2: 98% 100% 100% 100%  ?Weight:   71.6 kg   ? ? ?Intake/Output Summary (Last 24 hours) at 03/26/2022 0936 ?Last data filed at 03/26/2022 0533 ?Gross per 24 hour  ?Intake --  ?Output 550 ml  ?Net -550 ml  ? ? ?  03/26/2022  ?  5:33 AM 03/25/2022  ?  4:47 AM 03/14/2022  ?  2:26 PM  ?Last 3 Weights  ?Weight (lbs) 157 lb 13.6 oz 154 lb 15.7 oz 140 lb  ?Weight (kg) 71.6 kg 70.3 kg 63.504 kg  ?   ? ?Telemetry  ?  ?Atrial fibrillation with PVCs - Personally Reviewed ? ?ECG  ?  ?Atrial fibrillaiton with PVCs- Personally Reviewed ? ?Physical Exam  ? ?GEN: No acute distress.   ?Neck: No JVD ?Cardiac: irregular, no murmurs, rubs, or gallops.  ?Respiratory: Clear to auscultation bilaterally. ?GI: Soft, nontender, non-distended  ?MS: 1+ ankle edema, LE skin tender to touch ?Neuro:  Nonfocal  ?Psych: Normal affect  ? ?Labs  ?  ?High Sensitivity Troponin:   ?Recent Labs  ?Lab 03/14/22 ?1502 03/14/22 ?1723 03/25/2022 ?2126 03/24/22 ?0015  ?TROPONINIHS 29* 27* 33* 33*  ?   ?Chemistry ?Recent Labs  ?Lab 03/17/2022 ?2126 04/03/2022 ?2147 03/24/22 ?0725 03/25/22 ?0430 03/26/22 ?1749  ?NA 135   < > 143 140 136  ?K 5.3*   < > 3.8 4.0 3.7  ?CL 110   < > 116* 112* 105   ?CO2 18*  --  16* 20* 18*  ?GLUCOSE 112*   < > 91 130* 116*  ?BUN 52*   < > 48* 51* 44*  ?CREATININE 1.86*   < > 1.74* 1.82* 1.72*  ?CALCIUM 8.5*  --  9.0 9.0 8.6*  ?PROT 7.2  --  7.0  --   --   ?ALBUMIN 3.3*  --  3.0*  --   --   ?AST 64*  --  43*  --   --   ?ALT 33  --  26  --   --   ?ALKPHOS 156*  --  131*  --   --   ?BILITOT 3.1*  --  2.9* 1.8*  --   ?GFRNONAA 26*  --  28* 27* 29*  ?ANIONGAP 7  --  11 8 13   ? < > = values in this interval not displayed.  ?  ?Lipids No results for input(s): CHOL, TRIG, HDL, LABVLDL, LDLCALC, CHOLHDL in the last 168 hours.  ?Hematology ?Recent Labs  ?Lab 04/07/2022 ?2126 03/11/2022 ?2147 03/24/22 ?0725  ?WBC 4.2  --  5.4  ?RBC 3.88  --  3.87  ?  HGB 13.1 13.9 12.8  ?HCT 41.3 41.0 42.0  ?MCV 106.4*  --  108.5*  ?MCH 33.8  --  33.1  ?MCHC 31.7  --  30.5  ?RDW 17.2*  --  17.2*  ?PLT 73*  --  62*  ? ?Thyroid  ?Recent Labs  ?Lab 03/24/22 ?0725  ?TSH 5.836*  ?  ?BNP ?Recent Labs  ?Lab 03/31/2022 ?2126  ?BNP 1,555.0*  ?  ?DDimer No results for input(s): DDIMER in the last 168 hours.  ? ?Radiology  ?  ?EEG adult ? ?Result Date: 03/24/2022 ?Derek Jack, MD     03/24/2022  3:40 PM Routine EEG Report Jasmine [name] is a 88 y.o. female with a history of sycope who is undergoing an EEG to evaluate for seizures. Report: This EEG was acquired with electrodes placed according to the International 10-20 electrode system (including Fp1, Fp2, F3, F4, C3, C4, P3, P4, O1, O2, T3, T4, T5, T6, A1, A2, Fz, Cz, Pz). The following electrodes were missing or displaced: none. The occipital dominant rhythm was 7 Hz. This activity is reactive to stimulation. Drowsiness was manifested by background fragmentation; deeper stages of sleep were not identified. There was no focal slowing. There were no interictal epileptiform discharges. There were no electrographic seizures identified. There was no abnormal response to photic stimulation or hyperventilation. Impression and clinical correlation: This EEG was obtained  while awake and drowsy and is abnormal due to mild diffuse slowing indicative of global cerebral dysfunction. Epileptiform abnormalities were not seen during this recording. Su Monks, MD [facility] [phone] If 7pm- 7am, please page neurology on call as listed in [place].  ? ?ECHOCARDIOGRAM COMPLETE ? ?Result Date: 03/24/2022 ?   ECHOCARDIOGRAM REPORT   Patient Name:   Jasmine [name] Date of Exam: 03/24/2022 Medical Rec #:  [id number]        Height:       64.0 in Accession #:    [id number]       Weight:       140.0 lb Date of Birth:  13-Dec-1933        BSA:          1.681 m? Patient Age:    88 years         BP:           138/69 mmHg Patient Gender: F                HR:           62 bpm. Exam Location:  Inpatient Procedure: 2D Echo, Cardiac Doppler and Color Doppler Indications:    R55 Syncope  History:        Patient has prior history of Echocardiogram examinations, most                 recent 03/12/2021. CHF, TIA, Arrythmias:Atrial Fibrillation; Risk                 Factors:Hypertension and Dyslipidemia. Long term current use of                 anticoagulant therapy, CKD (chronic kidney disease) stage 3, GFR                 30-59 ml/min ([place]).  Sonographer:    Alvino Chapel RCS Referring Phys: [id number] Villalba  1. Left ventricular ejection fraction, by estimation, is 55 to 60%. The left ventricle has normal function. The left ventricle has no regional wall motion abnormalities. There  is mild left ventricular hypertrophy. Left ventricular diastolic parameters are indeterminate.  2. Ventricular septum is flattened in systole and diastole consistent with RV pressure and volume overload. . Right ventricular systolic function is moderately reduced. The right ventricular size is severely enlarged. There is mildly elevated pulmonary artery systolic pressure.  3. Left atrial size was severely dilated.  4. Right atrial size was severely dilated.  5. A small pericardial effusion is present.  The pericardial effusion is circumferential.  6. The mitral valve is abnormal. Mild mitral valve regurgitation. No evidence of mitral stenosis.  7. Hepatic systolic flow reversal consistent with severe TR. . The tricuspid valve is abnormal. Tricuspid valve regurgitation is severe.  8. The aortic valve is tricuspid. There is mild calcification of the aortic valve. There is mild thickening of the aortic valve. Aortic valve regurgitation is not visualized. No aortic stenosis is present.  9. The inferior vena cava is dilated in size with <50% respiratory variability, suggesting right atrial pressure of 15 mmHg. FINDINGS  Left Ventricle: Left ventricular ejection fraction, by estimation, is 55 to 60%. The left ventricle has normal function. The left ventricle has no regional wall motion abnormalities. The left ventricular internal cavity size was normal in size. There is  mild left ventricular hypertrophy. Left ventricular diastolic parameters are indeterminate. Right Ventricle: Ventricular septum is flattened in systole and diastole consistent with RV pressure and volume overload. The right ventricular size is severely enlarged. Right vetricular wall thickness was not well visualized. Right ventricular systolic  function is moderately reduced. There is mildly elevated pulmonary artery systolic pressure. The tricuspid regurgitant velocity is 2.73 m/s, and with an assumed right atrial pressure of 15 mmHg, the estimated right ventricular systolic pressure is 34.3 mmHg. Left Atrium: Left atrial size was severely dilated. Right Atrium: Right atrial size was severely dilated. Pericardium: A small pericardial effusion is present. The pericardial effusion is circumferential. Mitral Valve: The mitral valve is abnormal. There is mild thickening of the mitral valve leaflet(s). There is mild calcification of the mitral valve leaflet(s). Mild mitral annular calcification. Mild mitral valve regurgitation. No evidence of mitral valve  stenosis. Tricuspid Valve: Hepatic systolic flow reversal consistent with severe TR. The tricuspid valve is abnormal. Tricuspid valve regurgitation is severe. No evidence of tricuspid stenosis. Aortic Va

## 2022-03-26 NOTE — Progress Notes (Incomplete)
?Progress Note ? ?Patient: Jasmine [name] SVX:[id number] DOB: Nov 09, 1934  ?DOA: 04/01/2022  DOS: 03/26/2022  ?  ?Brief hospital course: ?JUNITA KUBOTA is an 88 y.o. female with a history of chronic AFib, chronic HFpEF, stage IV CKD, HTN, DVT, hypothyroidism, TIA, GERD, vertigo who presented to the ED 4/15 after an approximately 15 minute episode of unresponsiveness in recliner at home. She also complained of worsening tender redness and swelling in the legs bilaterally not improved despite taking an increased dose of lasix per her nephrologist and doxycycline as an outpatient. She was admitted for syncope evaluation and bilateral lower extremity cellulitis. Exam was consistent with peripheral volume overload and echocardiogram revealed right heart failure. Cardiology was consulted to assist with management and measured diuresis is ongoing. ? ?Assessment and Plan: ?Acute on chronic right heart failure: Exacerbated by TR that appears worse on echo, severe biatrial enlargement. +JVD, dependent edema. Lungs sound clear. No DVT in LE's. ?- Continue diuresis with with spironolactone added 4/16, holding IV lasix today. Plan to target euvolemia slowly without precipitously dropping preload. Appreciate cardiology assistance.  ?- Monitoring daily weights, strict I/O. ? ?Stage 4 CKD: CrCl hovering in low 20's ml/min.  ?- Monitor metabolic panel daily. Does not appear to currently require dialysis, sees Dr. Royce Macadamia at [facility].  ? ?PVD: Arterial insufficiency in lower extremities likely stable per vascular surgery. Also with venous insufficiency, though with atherosclerotic burden, cannot use too much compression.  ?- Elevate extremities ?- Anticoagulation ? ?Unresponsive episode: Unclear etiology, possibly due to decreased cerebral perfusion due to low output/R heart failure.  ?- Continue cardiac monitoring ? ?Permanent atrial fibrillation: Rate is controlled in 50-70's ?- Holding atenolol from admission due to bradycardic rate ?-  Continue eliquis 2.5mg  BID ?  ?Hypothyroidism: TSH is 5.836 ?- Continue synthroid at current dose for now.  ?  ?Nonpurulent cellulitis:  ?- Continue ceftriaxone.  ? ?Subjective: Pain in legs is improved but still moderate, worse with palpation.  ? ?Objective: ?Vitals:  ? 03/25/22 2119 03/26/22 0032 03/26/22 0533 03/26/22 0829  ?BP: 132/66 (!) 149/74 (!) 118/50 131/63  ?Pulse: 76 (!) 49 75 100  ?Resp: 20 19 18 19   ?Temp: 97.7 ?F (36.5 ?C) 97.9 ?F (36.6 ?C) 97.6 ?F (36.4 ?C)   ?TempSrc: Oral Oral Oral   ?SpO2: 98% 100% 100% 100%  ?Weight:   71.6 kg   ? ?Gen: 88 y.o. female in no distress ?Pulm: Nonlabored breathing room air. Clear ?CV: Irreg irreg with II/VI, maybe II/VI holosystolic murmur at LSB without rub, or gallop. + JVD, + improved dependent edema. ?GI: Abdomen soft, non-tender, non-distended, with normoactive bowel sounds.  ?Ext: Warm, no deformities ?Skin: Tender erythema with swelling affecting both lower extremities, intact and unroofed bullae on left. No exudate.  ?Neuro: Alert and oriented. No focal neurological deficits. ?Psych: Judgement and insight appear fair. Mood euthymic & affect congruent. Behavior is appropriate.   ? ?Data Personally reviewed: ? ?CBC: ?Recent Labs  ?Lab 03/16/2022 ?2126 03/14/2022 ?2147 03/24/22 ?0725  ?WBC 4.2  --  5.4  ?NEUTROABS 2.6  --   --   ?HGB 13.1 13.9 12.8  ?HCT 41.3 41.0 42.0  ?MCV 106.4*  --  108.5*  ?PLT 73*  --  62*  ? ? ?Basic Metabolic Panel: ?Recent Labs  ?Lab 03/30/2022 ?2126 04/05/2022 ?2147 03/24/22 ?0725 03/25/22 ?0430 03/26/22 ?2330  ?NA 135 142 143 140 136  ?K 5.3* 4.2 3.8 4.0 3.7  ?CL 110 114* 116* 112* 105  ?CO2 18*  --  16* 20* 18*  ?GLUCOSE 112* 110* 91 130* 116*  ?BUN 52* 66* 48* 51* 44*  ?CREATININE 1.86* 2.00* 1.74* 1.82* 1.72*  ?CALCIUM 8.5*  --  9.0 9.0 8.6*  ? ? ?GFR: ?Estimated Creatinine Clearance: 23.2 mL/min (A) (by C-G formula based on SCr of 1.72 mg/dL (H)). ?Liver Function Tests: ?Recent Labs  ?Lab 03/27/2022 ?2126 03/24/22 ?0725 03/25/22 ?0430  ?AST  64* 43*  --   ?ALT 33 26  --   ?ALKPHOS 156* 131*  --   ?BILITOT 3.1* 2.9* 1.8*  ?PROT 7.2 7.0  --   ?ALBUMIN 3.3* 3.0*  --   ? ? ?No results for input(s): LIPASE, AMYLASE in the last 168 hours. ?No results for input(s): AMMONIA in the last 168 hours. ?Coagulation Profile: ?Recent Labs  ?Lab 04/03/2022 ?2126  ?INR 1.8*  ? ? ?Cardiac Enzymes: ?No results for input(s): CKTOTAL, CKMB, CKMBINDEX, TROPONINI in the last 168 hours. ?BNP (last 3 results) ?No results for input(s): PROBNP in the last 8760 hours. ?HbA1C: ?No results for input(s): HGBA1C in the last 72 hours. ?CBG: ?No results for input(s): GLUCAP in the last 168 hours. ?Lipid Profile: ?No results for input(s): CHOL, HDL, LDLCALC, TRIG, CHOLHDL, LDLDIRECT in the last 72 hours. ?Thyroid Function Tests: ?Recent Labs  ?  03/24/22 ?0725  ?TSH 5.836*  ? ? ?Anemia Panel: ?Recent Labs  ?  03/25/22 ?0430  ?[id number]  ?FOLATE 13.2  ? ? ?Urine analysis: ?   ?Component Value Date/Time  ? COLORURINE AMBER (A) 03/24/2022 0328  ? APPEARANCEUR HAZY (A) 03/24/2022 0328  ? LABSPEC 1.024 03/24/2022 0328  ? PHURINE 5.0 03/24/2022 0328  ? GLUCOSEU NEGATIVE 03/24/2022 0328  ? [place] NEGATIVE 03/24/2022 0328  ? [place] NEGATIVE 03/24/2022 0328  ? [place] NEGATIVE 03/24/2022 0328  ? PROTEINUR 30 (A) 03/24/2022 0328  ? NITRITE NEGATIVE 03/24/2022 0328  ? LEUKOCYTESUR NEGATIVE 03/24/2022 0328  ? ?Recent Results (from the past 240 hour(s))  ?Resp Panel by RT-PCR (Flu A&B, Covid) Nasopharyngeal Swab     Status: None  ? Collection Time: 03/22/2022  9:26 PM  ? Specimen: Nasopharyngeal Swab; Nasopharyngeal(NP) swabs in vial transport medium  ?Result Value Ref Range Status  ? SARS Coronavirus 2 by RT PCR NEGATIVE NEGATIVE Final  ?  Comment: (NOTE) ?SARS-CoV-2 target nucleic acids are NOT DETECTED. ? ?The SARS-CoV-2 RNA is generally detectable in upper respiratory ?specimens during the acute phase of infection. The lowest ?concentration of SARS-CoV-2 viral copies this assay can detect  is ?138 copies/mL. A negative result does not preclude SARS-Cov-2 ?infection and should not be used as the sole basis for treatment or ?other patient management decisions. A negative result may occur with  ?improper specimen collection/handling, submission of specimen other ?than nasopharyngeal swab, presence of viral mutation(s) within the ?areas targeted by this assay, and inadequate number of viral ?copies(<138 copies/mL). A negative result must be combined with ?clinical observations, patient history, and epidemiological ?information. The expected result is Negative. ? ?Fact Sheet for Patients:  ?EntrepreneurPulse.com.au ? ?Fact Sheet for Healthcare Providers:  ?IncredibleEmployment.be ? ?This test is no t yet approved or cleared by the [place] FDA and  ?has been authorized for detection and/or diagnosis of SARS-CoV-2 by ?FDA under an Emergency Use Authorization (EUA). This EUA will remain  ?in effect (meaning this test can be used) for the duration of the ?COVID-19 declaration under Section 564(b)(1) of the Act, 21 ?U.S.C.section 360bbb-3(b)(1), unless the authorization is terminated  ?or revoked sooner.  ? ? ?  ?  Influenza A by PCR NEGATIVE NEGATIVE Final  ? Influenza B by PCR NEGATIVE NEGATIVE Final  ?  Comment: (NOTE) ?The Xpert Xpress SARS-CoV-2/FLU/RSV plus assay is intended as an aid ?in the diagnosis of influenza from Nasopharyngeal swab specimens and ?should not be used as a sole basis for treatment. Nasal washings and ?aspirates are unacceptable for Xpert Xpress SARS-CoV-2/FLU/RSV ?testing. ? ?Fact Sheet for Patients: ?EntrepreneurPulse.com.au ? ?Fact Sheet for Healthcare Providers: ?IncredibleEmployment.be ? ?This test is not yet approved or cleared by the [place] FDA and ?has been authorized for detection and/or diagnosis of SARS-CoV-2 by ?FDA under an Emergency Use Authorization (EUA). This EUA will remain ?in effect  (meaning this test can be used) for the duration of the ?COVID-19 declaration under Section 564(b)(1) of the Act, 21 U.S.C. ?section 360bbb-3(b)(1), unless the authorization is terminated or ?revoked.

## 2022-03-26 NOTE — Progress Notes (Signed)
?  Transition of Care (TOC) Screening Note ? ? ?Patient Details  ?Name: Jasmine [name] ?Date of Birth: [birth year] ? ? ?Transition of Care (TOC) CM/SW Contact:    ?Milas Gain, LCSWA ?Phone Number: ?03/26/2022, 3:45 PM ? ? ? ?Transition of Care Department [facility]) has reviewed patient and no TOC needs have been identified at this time. We will continue to monitor patient advancement through interdisciplinary progression rounds. If new patient transition needs arise, please place a TOC consult. ?  ?

## 2022-03-27 DIAGNOSIS — E039 Hypothyroidism, unspecified: Secondary | ICD-10-CM | POA: Diagnosis present

## 2022-03-27 DIAGNOSIS — I4821 Permanent atrial fibrillation: Secondary | ICD-10-CM | POA: Diagnosis not present

## 2022-03-27 DIAGNOSIS — R55 Syncope and collapse: Secondary | ICD-10-CM | POA: Diagnosis not present

## 2022-03-27 DIAGNOSIS — I5033 Acute on chronic diastolic (congestive) heart failure: Secondary | ICD-10-CM | POA: Diagnosis not present

## 2022-03-27 DIAGNOSIS — I10 Essential (primary) hypertension: Secondary | ICD-10-CM | POA: Diagnosis not present

## 2022-03-27 LAB — CBC
HCT: 37.1 % (ref 36.0–46.0)
Hemoglobin: 12.2 g/dL (ref 12.0–15.0)
MCH: 33.2 pg (ref 26.0–34.0)
MCHC: 32.9 g/dL (ref 30.0–36.0)
MCV: 100.8 fL — ABNORMAL HIGH (ref 80.0–100.0)
Platelets: 74 10*3/uL — ABNORMAL LOW (ref 150–400)
RBC: 3.68 MIL/uL — ABNORMAL LOW (ref 3.87–5.11)
RDW: 16.8 % — ABNORMAL HIGH (ref 11.5–15.5)
WBC: 5 10*3/uL (ref 4.0–10.5)
nRBC: 0.4 % — ABNORMAL HIGH (ref 0.0–0.2)

## 2022-03-27 LAB — COMPREHENSIVE METABOLIC PANEL
ALT: 21 U/L (ref 0–44)
AST: 36 U/L (ref 15–41)
Albumin: 2.8 g/dL — ABNORMAL LOW (ref 3.5–5.0)
Alkaline Phosphatase: 142 U/L — ABNORMAL HIGH (ref 38–126)
Anion gap: 10 (ref 5–15)
BUN: 48 mg/dL — ABNORMAL HIGH (ref 8–23)
CO2: 18 mmol/L — ABNORMAL LOW (ref 22–32)
Calcium: 8.7 mg/dL — ABNORMAL LOW (ref 8.9–10.3)
Chloride: 106 mmol/L (ref 98–111)
Creatinine, Ser: 1.78 mg/dL — ABNORMAL HIGH (ref 0.44–1.00)
GFR, Estimated: 28 mL/min — ABNORMAL LOW (ref >60)
Glucose, Bld: 117 mg/dL — ABNORMAL HIGH (ref 70–99)
Potassium: 4.7 mmol/L (ref 3.5–5.1)
Sodium: 134 mmol/L — ABNORMAL LOW (ref 135–145)
Total Bilirubin: 1.8 mg/dL — ABNORMAL HIGH (ref 0.3–1.2)
Total Protein: 6.7 g/dL (ref 6.5–8.1)

## 2022-03-27 LAB — MAGNESIUM: Magnesium: 2 mg/dL (ref 1.7–2.4)

## 2022-03-27 MED ORDER — SPIRONOLACTONE 25 MG PO TABS: 25.0000 mg | ORAL_TABLET | Freq: Every day | ORAL | Status: DC

## 2022-03-27 MED ORDER — SPIRONOLACTONE 12.5 MG HALF TABLET
12.5000 mg | ORAL_TABLET | Freq: Every day | ORAL | Status: DC
  Administered 2022-03-27 – 2022-03-30 (×4): 12.5 mg via ORAL
  Filled 2022-03-27 (×4): qty 1

## 2022-03-27 NOTE — Assessment & Plan Note (Addendum)
Continue inotropic support with milrinone through peripheral line, dose has been decreased.  ?Continue to wean per cardiology recommendations.  ?Continue diuresis with torsemide.  ?

## 2022-03-27 NOTE — Progress Notes (Signed)
Pt was having multiple episodes of PVCs & ectopy; Alcario Drought, MD made aware, see new lab orders. Will continue to monitor. ? ?Elaina Hoops, RN ?  ?

## 2022-03-27 NOTE — Hospital Course (Addendum)
Jasmine [name] was admitted to the hospital with the working diagnosis of decompensated heart failure, advanced RV failure and pulmonary hypertension.  ? ?88 yo female with the past medical history of chronic atrial fibrillation, diastolic heart failure, hypertension, chronic kidney disease stage 4, hypothyroidism, TIA, vertigo and DVT, who presented after a syncope episode. She was found on her recliner not responsive for 15 minutes, EMS was called, she slowly recovered her consciousness and she was transported to the ED. On her initial physical examination her blood pressure was 135/84, HR 38 to 58, RR 17 and 02 saturation 100%, lungs with no wheezing or rales, heart with S1 and S2 present and irregular, abdomen not distended, positive ++++ lower extremity edema with blisters.  ? ?Na 135, K 5,3 cl 110, bicarbonate 18, glucose 112 bun 52 cr 1,86 ?BNP 1,555 ?High sensitive troponin is 33 ?Wbc 4.2 hgb 13.1 plt  73  ?Sars covid 19 negative  ? ?Head CT negative for acute changes.  ? ?Chest radiograph with cardiomegaly, bilateral hilar vascular congestion. ? ?EKG 53 bpm, right axis, normal qtc, atrial fibrillation rhythm, with no significant ST segment or T wave changes.  ? ?Patient was placed on furosemide for diuresis. ?Echocardiogram with significant tricuspid regurgitation and moderate pulmonary hypertension.  ? ?Cardiac catheterization with low cardiac output 3.6 and index 2, ([place]), and thermodilution 3,1 and 1.7.  ?PCWP 29 and mean PA 42.  ?Patient was placed on inotropic support and further aggressive diuresis.  ?Amiodarone for atrial fibrillation.  ? ?04/ 22 patient responding to diuresis, but not achieving normovolemia. ?Very poor prognosis due to pulmonary hypertension and end stage heart failure. ?04/15 hospice has been consulted.  ?Transitioning to oral cardiac agents.  ?

## 2022-03-27 NOTE — Assessment & Plan Note (Addendum)
Significant improvement in rash bilateral legs. Likely skin changes due to peripheral vascular disease. ? ?Antibiotic therapy has been discontinued.  ?Cellulitis has been ruled out.  ? ?04/25 Patient had IV infiltration on her left upper extremity, continue local skin care.  ?Pain control with analgesics.  ?

## 2022-03-27 NOTE — Progress Notes (Signed)
?Progress Note ? ? ?Patient: Jasmine [name] XAJ:[id number] DOB: [birth year] DOA: 04/03/2022     3 ?DOS: the patient was seen and examined on 03/27/2022 ?  ?Brief hospital course: ?Mrs. Caetano was admitted to the hospital with the working diagnosis of decompensated heart failure ? ?88 yo female with the past medical history of chronic atrial fibrillation, diastolic heart failure, hypertension, chronic kidney disease stage 4, hypothyroidism, TIA, vertigo and DVT, who presented after a syncope episode. She was found on her recliner not responsive for 15 minutes, EMS was called, she slowly recovered her consciousness and she was transported to the ED. On her initial physical examination her blood pressure was 135/84, HR 38 to 58, RR 17 and 02 saturation 100%, lungs with no wheezing or rales, heart with S1 and S2 present and irregular, abdomen not distended, positive ++++ lower extremity edema with blisters.  ? ?Na 135, K 5,3 cl 110, bicarbonate 18, glucose 112 bun 52 cr 1,86 ?BNP 1,555 ?High sensitive troponin is 33 ?Wbc 4.2 hgb 13.1 plt  73  ?Sars covid 19 negative  ? ?Head CT negative for acute changes.  ? ?Chest radiograph with cardiomegaly, bilateral hilar vascular congestion. ? ?EKG 53 bpm, right axis, normal qtc, atrial fibrillation rhythm, with no significant ST segment or T wave changes.  ? ?Patient was placed on furosemide for diuresis. ?Echocardiogram with significant tricuspid regurgitation and moderate pulmonary hypertension.  ? ?Assessment and Plan: ?* Acute on chronic diastolic CHF (congestive heart failure) ([place]) ?Echocardiogram with preserved LV systolic function EF 55 to 60%, ventricular septum flattened in systole and diastole consistent with RV pressure and volume overload. Moderate reduction in RV systolic dysfunction, RV cavity with severe enlargement, RVSP 44,8. Severe dilatation of bilateral atriums. Small pericardial effusion. Severe tricuspid valve regurgitation.  ? ?Pulmonary hypertension.   ? ?Urine output is 500 cc (not clear if accurate documentation).  ?Systolic blood pressure 094 mmHg range.  ? ?Continue diuresis with furosemide 40 mg daily.  ?Spironolactone 12.5 mg daily.  ?Add SGLT2i when GFR more stable.  ? ?Atrial fibrillation ([place]) ?Patient with slow rate atrial fibrillation. ?Continue to hold on AV blocking agents ?Continue anticoagulation with apixaban.  ? ?Hypertension ?Continue close blood pressure monitoring. ?Diuresis with furosemide and spironolactone.  ?Continue to hold on irbesartan.  ? ?Stage 4 chronic kidney disease ([place]) ?hyponatremia ? ?Renal function with serum cr at 1,78 K is 4,7 and Na 134 ? ?Patient continue to have signs of hypervolemia, continue with furosemide and spironolactone. ?Follow up renal function in am, avoid hypotension and nephrotoxic medications.  ? ?Syncope ?Multifactorial, patient with RV failure and pulmonary hypertension. ?Continue PT and OT.  ?Patient uses a walker for ambulation at home.  ? ?EEG with no seizures.  ? ?Cellulitis ?Significant improvement in rash bilateral legs. Likely skin changed due to peripheral vascular disease. ?Will hold on antibiotic therapy for now.  ?Continue local care  ? ?Hypothyroidism ?Continue with levothyroxine  ? ? ? ? ?  ? ?Subjective: Patient with persistent dyspnea, edema has improved but not back to her baseline.  ? ?Physical Exam: ?Vitals:  ? 03/26/22 1224 03/26/22 1349 03/26/22 2042 03/27/22 0418  ?BP: 130/81 130/84 132/66 (!) 133/59  ?Pulse: 78 85 77 83  ?Resp: 17  20 20   ?Temp: 97.8 ?F (36.6 ?C)  98.4 ?F (36.9 ?C) 97.7 ?F (36.5 ?C)  ?TempSrc: Oral  Oral Oral  ?SpO2: 100% 99% 100% 99%  ?Weight:    72.1 kg  ? ?Neurology awake and alert, deconditioned and  ill looking appearing ?ENT with mild pallor ?Cardiovascular with S1 and S2 loud P2 present. Irregularly irregular. Positive systolic murmur at the right sternal border 3/6 ?Positive JVD ?Positive lower extremity edema ++ pitting ?Respiratory with bilateral rales, but  no wheezing ?Abdomen not distended   ?Left leg with dressing in place.  ?Data Reviewed: ? ? ? ?Family Communication: I spoke with patient's brother and nephew at the bedside, we talked in detail about patient's condition, plan of care and prognosis and all questions were addressed. ? ? ?Disposition: ?Status is: Inpatient ?Remains inpatient appropriate because: heart failure  ? Planned Discharge Destination: Home ? ? ? ?Author: ?Tawni Millers, MD ?03/27/2022 1:55 PM ? ?For on call review [email or web address].  ?

## 2022-03-27 NOTE — Assessment & Plan Note (Signed)
Continue with levothyroxine  

## 2022-03-27 NOTE — H&P (View-Only) (Signed)
?  ?Advanced Heart Failure Team Consult Note ? ? ?Primary Physician: [name], MD ?PCP-Cardiologist:  Sinclair Grooms, MD ? ?Reason for Consultation: Acute on chronic diastolic CHF with RV failure ? ?HPI:   ? ?Jasmine [name] is seen today for evaluation of acute on chronic diastolic CHF with RV failure at the request of Dr. Sallyanne Kuster.  88 y.o. female with history of chronic atrial fibrillation, HFpEF with RV failure, severe TR, CKD IV, hx TIA, HTN.  Has followed with Dr. Tamala Julian in Cardiology clinic. ? ?Seen in ED 03/14/22 with lower extremity edema and dyspnea. She was given 80 mg lasix IV and felt to be stable for discharge home.  ? ?Returned to ED on 03/21/2022 after episode of unresponsiveness. Noted increasing dizziness and lightheadedness. Labs with Scr 1.8 (baseline 1.5-1.8), K 5.3, CO2 18, lactic acid okay, HS TN 33>33, BNP 1555. CT head no acute process. CXR with cardiomegaly but no significant effusions or pulmonary edema. HR 50s in ED on arrival. Atenolol held. Started on abx for possible LLE cellulitis. She was given IV fluids d/t concern for dehydration.  ? ?Echo EF 55-60%, ventricular septum flattened in systole and diastole consistent with RV pressure and volume overload, RV moderately reduced, severe TR, severe BAE, dilated IVC with estimated RAP 15 mmHg, small pericardial effusion ? ?Cardiology consulted. She was started on IV lasix >> now on 40 mg po and spiro 25 mg daily. Episode of syncope felt to be likely secondary to preload dependency in setting of RV failure +/- low cardiac output. Advanced Heart Failure team asked to assist with management of RV failure and diuresis. ? ? ?Review of Systems: [y] = yes, [ ]  = no  ? ?General: Weight gain [Y]; Weight loss [ ] ; Anorexia [ ] ; Fatigue [Y]; Fever [ ] ; Chills [ ] ; Weakness [Y]  ?Cardiac: Chest pain/pressure [ ] ; Resting SOB [ ] ; Exertional SOB [Y]; Orthopnea [ ] ; Pedal Edema [Y]; Palpitations [ ] ; Syncope [Y]; Presyncope [ ] ; Paroxysmal  nocturnal dyspnea[ ]   ?Pulmonary: Cough [ ] ; Wheezing[ ] ; Hemoptysis[ ] ; Sputum [ ] ; Snoring [ ]   ?GI: Vomiting[ ] ; Dysphagia[ ] ; Melena[ ] ; Hematochezia [ ] ; Heartburn[ ] ; Abdominal pain [ ] ; Constipation [ ] ; Diarrhea [ ] ; BRBPR [ ]   ?GU: Hematuria[ ] ; Dysuria [ ] ; Nocturia[ ]   ?Vascular: Pain in legs with walking [ ] ; Pain in feet with lying flat [ ] ; Non-healing sores [ ] ; Stroke [ ] ; TIA [ ] ; Slurred speech [ ] ;  ?Neuro: Headaches[ ] ; Vertigo[ ] ; Seizures[ ] ; Paresthesias[ ] ;Blurred vision [ ] ; Diplopia [ ] ; Vision changes [ ]   ?Ortho/Skin: Arthritis [ ] ; Joint pain [ ] ; Muscle pain [ ] ; Joint swelling [ ] ; Back Pain [ ] ; Rash [ ]   ?Psych: Depression[ ] ; Anxiety[ ]   ?Heme: Bleeding problems [ ] ; Clotting disorders [ ] ; Anemia [ ]   ?Endocrine: Diabetes [ ] ; Thyroid dysfunction[ ]  ? ?Home Medications ?Prior to Admission medications   ?Medication Sig Start Date End Date Taking? Authorizing Provider  ?atenolol (TENORMIN) 100 MG tablet Take 100 mg by mouth daily. 12/14/20  Yes [provider]  ?ELIQUIS 2.5 MG TABS tablet Take 2.5 mg by mouth 2 (two) times daily. 08/17/19  Yes [provider]  ?EUTHYROX 25 MCG tablet Take 25 mcg by mouth daily before breakfast. 03/07/20  Yes [provider]  ?furosemide (LASIX) 80 MG tablet Take 80 mg by mouth daily.   Yes [provider]  ?irbesartan (AVAPRO) 75 MG  tablet Take 1 tablet by mouth once daily ?Patient taking differently: Take 75 mg by mouth daily. 05/14/21  Yes Belva Crome, MD  ?cefpodoxime (VANTIN) 200 MG tablet Take 200 mg by mouth daily. ?Patient not taking: Reported on 04/04/2022 01/18/22   [provider]  ?doxycycline (VIBRA-TABS) 100 MG tablet Take 100 mg by mouth See admin instructions. Bid x 10 days ?Patient not taking: Reported on 03/21/2022 02/28/22   [provider]  ?metolazone (ZAROXOLYN) 5 MG tablet Take 5 mg by mouth once a week. ?Patient not taking: Reported on 03/31/2022 02/06/22   [provider]   ? ? ?Past Medical History: ?Past Medical History:  ?Diagnosis Date  ? Allergic rhinitis due to pollen   ? Atrial fibrillation ([place]) 12/23/2011  ? Atrial fibrillation with rapid ventricular response ([place])   ? Chronic diastolic HF (heart failure) ([place]) 11/21/2012  ? CKD (chronic kidney disease) stage 3, GFR 30-59 ml/min ([place]) 04/19/2019  ? Congestive heart failure, unspecified   ? patient states she is not aware of this  ? Deep venous thrombosis of lower extremity ([place]) 06/02/2012  ? Deviated septum 01/11/2020  ? Dizziness   ? DVT (deep venous thrombosis) ([facility]) left leg  ? Epistaxis 01/11/2020  ? Essential hypertension, malignant   ? GERD (gastroesophageal reflux disease)   ? Heart murmur   ? Insulin resistance   ? History of insulin resistance.  ? Insulin resistance   ? Long term current use of anticoagulant therapy 02/08/2014  ? coumadin   ? Obesity   ? Pure hypercholesterolemia   ? TIA (transient ischemic attack) 10/02/2018  ? Vertigo   ? ? ?Past Surgical History: ?Past Surgical History:  ?Procedure Laterality Date  ? ANKLE FRACTURE SURGERY Right   ? APPENDECTOMY    ? BREAST BIOPSY    ? CHOLECYSTECTOMY    ? PARTIAL HYSTERECTOMY    ? TEE WITHOUT CARDIOVERSION N/A 03/12/2021  ? Procedure: TRANSESOPHAGEAL ECHOCARDIOGRAM (TEE);  Surgeon: Werner Lean, MD;  Location: [facility] ENDOSCOPY;  Service: Cardiovascular;  Laterality: N/A;  ? ? ?Family History: ?Family History  ?Problem Relation Age of Onset  ? Diabetes Mother   ? Hypertension Sister   ? Pulmonary embolism Sister   ? ? ?Social History: ?Social History  ? ?Socioeconomic History  ? Marital status: Single  ?  Spouse name: Not on file  ? Number of children: Not on file  ? Years of education: Not on file  ? Highest education level: Not on file  ?Occupational History  ? Not on file  ?Tobacco Use  ? Smoking status: Never  ? Smokeless tobacco: Never  ?Substance and Sexual Activity  ? Alcohol use: No  ? Drug use: No  ? Sexual activity: Not Currently  ?  Birth  control/protection: Post-menopausal  ?Other Topics Concern  ? Not on file  ?Social History Narrative  ? Not on file  ? ?Social Determinants of Health  ? ?Financial Resource Strain: Not on file  ?Food Insecurity: Not on file  ?Transportation Needs: Not on file  ?Physical Activity: Not on file  ?Stress: Not on file  ?Social Connections: Not on file  ? ? ?Allergies:  ?Allergies  ?Allergen Reactions  ? Lovenox [Enoxaparin]   ? Norvasc [Amlodipine Besylate]   ?  Pt unsure   ? Sulfa Antibiotics   ?  Dizziness   ? Codeine Rash  ? Latex Rash  ? ? ?Objective:   ? ?Vital Signs:   ?Temp:  [97.7 ?F (36.5 ?C)-98.4 ?F (  36.9 ?C)] 97.7 ?F (36.5 ?C) (04/19 0418) ?Pulse Rate:  [77-85] 83 (04/19 0418) ?Resp:  [20] 20 (04/19 0418) ?BP: (130-133)/(59-84) 133/59 (04/19 0418) ?SpO2:  [99 %-100 %] 99 % (04/19 0418) ?Weight:  [72.1 kg] 72.1 kg (04/19 0418) ?Last BM Date : 03/10/2022 ? ?Weight change: ?Filed Weights  ? 03/25/22 0447 03/26/22 0533 03/27/22 0418  ?Weight: 70.3 kg 71.6 kg 72.1 kg  ? ? ?Intake/Output:  ? ?Intake/Output Summary (Last 24 hours) at 03/27/2022 1305 ?Last data filed at 03/26/2022 2347 ?Gross per 24 hour  ?Intake 480 ml  ?Output 500 ml  ?Net -20 ml  ?  ? ? ?Physical Exam  ?  ?General:  Elderly, chronically ill appearing ?HEENT: normal ?Neck: supple. JVP to jaw. Carotids 2+ bilat; no bruits.  ?Cor: PMI nondisplaced. Irregular rate & rhythm. No rubs, gallops, 2/6 TR murmur ?Lungs: clear ?Abdomen: soft, nontender, nondistended.  ?Extremities: no cyanosis, clubbing, rash, 2+ edema ?Neuro: alert & orientedx3, cranial nerves grossly intact. moves all 4 extremities w/o difficulty. Affect pleasant ? ? ?Telemetry  ? ?AF 70s-80s, 20 PVCs/min ? ?EKG  ?  ?Afib with rate 53 bpm, PVCs ? ?Labs  ? ?Basic Metabolic Panel: ?Recent Labs  ?Lab 04/02/2022 ?2126 03/12/2022 ?2147 03/24/22 ?0725 03/25/22 ?0430 03/26/22 ?7628 03/27/22 ?0244  ?NA 135 142 143 140 136 134*  ?K 5.3* 4.2 3.8 4.0 3.7 4.7  ?CL 110 114* 116* 112* 105 106  ?CO2 18*  --  16*  20* 18* 18*  ?GLUCOSE 112* 110* 91 130* 116* 117*  ?BUN 52* 66* 48* 51* 44* 48*  ?CREATININE 1.86* 2.00* 1.74* 1.82* 1.72* 1.78*  ?CALCIUM 8.5*  --  9.0 9.0 8.6* 8.7*  ?MG  --   --   --   --   --  2.0  ? ? ?Liver Function Tests

## 2022-03-27 NOTE — Assessment & Plan Note (Addendum)
Hyponatremia, non anion gap metabolic acidosis.  ? ?04/22 metolazone .  ?Transition from furosemide IV to torsemide po.  ?Continue to have hypervolemia, she had 5000 ml urine over last 24 hrs. ?Renal function today with serum cr at 2.36, K 4,4 Na 126, and serum bicarbonate at 22.  ?Plan to continue diuresis with torsemide ?Follow renal function in am.  ?

## 2022-03-27 NOTE — Consult Note (Addendum)
?  ?Advanced Heart Failure Team Consult Note ? ? ?Primary Physician: [name], MD ?PCP-Cardiologist:  Sinclair Grooms, MD ? ?Reason for Consultation: Acute on chronic diastolic CHF with RV failure ? ?HPI:   ? ?Jasmine [name] is seen today for evaluation of acute on chronic diastolic CHF with RV failure at the request of Dr. Sallyanne Kuster.  88 y.o. female with history of chronic atrial fibrillation, HFpEF with RV failure, severe TR, CKD IV, hx TIA, HTN.  Has followed with Dr. Tamala Julian in Cardiology clinic. ? ?Seen in ED 03/14/22 with lower extremity edema and dyspnea. She was given 80 mg lasix IV and felt to be stable for discharge home.  ? ?Returned to ED on 03/21/2022 after episode of unresponsiveness. Noted increasing dizziness and lightheadedness. Labs with Scr 1.8 (baseline 1.5-1.8), K 5.3, CO2 18, lactic acid okay, HS TN 33>33, BNP 1555. CT head no acute process. CXR with cardiomegaly but no significant effusions or pulmonary edema. HR 50s in ED on arrival. Atenolol held. Started on abx for possible LLE cellulitis. She was given IV fluids d/t concern for dehydration.  ? ?Echo EF 55-60%, ventricular septum flattened in systole and diastole consistent with RV pressure and volume overload, RV moderately reduced, severe TR, severe BAE, dilated IVC with estimated RAP 15 mmHg, small pericardial effusion ? ?Cardiology consulted. She was started on IV lasix >> now on 40 mg po and spiro 25 mg daily. Episode of syncope felt to be likely secondary to preload dependency in setting of RV failure +/- low cardiac output. Advanced Heart Failure team asked to assist with management of RV failure and diuresis. ? ? ?Review of Systems: [y] = yes, [ ]  = no  ? ?General: Weight gain [Y]; Weight loss [ ] ; Anorexia [ ] ; Fatigue [Y]; Fever [ ] ; Chills [ ] ; Weakness [Y]  ?Cardiac: Chest pain/pressure [ ] ; Resting SOB [ ] ; Exertional SOB [Y]; Orthopnea [ ] ; Pedal Edema [Y]; Palpitations [ ] ; Syncope [Y]; Presyncope [ ] ; Paroxysmal  nocturnal dyspnea[ ]   ?Pulmonary: Cough [ ] ; Wheezing[ ] ; Hemoptysis[ ] ; Sputum [ ] ; Snoring [ ]   ?GI: Vomiting[ ] ; Dysphagia[ ] ; Melena[ ] ; Hematochezia [ ] ; Heartburn[ ] ; Abdominal pain [ ] ; Constipation [ ] ; Diarrhea [ ] ; BRBPR [ ]   ?GU: Hematuria[ ] ; Dysuria [ ] ; Nocturia[ ]   ?Vascular: Pain in legs with walking [ ] ; Pain in feet with lying flat [ ] ; Non-healing sores [ ] ; Stroke [ ] ; TIA [ ] ; Slurred speech [ ] ;  ?Neuro: Headaches[ ] ; Vertigo[ ] ; Seizures[ ] ; Paresthesias[ ] ;Blurred vision [ ] ; Diplopia [ ] ; Vision changes [ ]   ?Ortho/Skin: Arthritis [ ] ; Joint pain [ ] ; Muscle pain [ ] ; Joint swelling [ ] ; Back Pain [ ] ; Rash [ ]   ?Psych: Depression[ ] ; Anxiety[ ]   ?Heme: Bleeding problems [ ] ; Clotting disorders [ ] ; Anemia [ ]   ?Endocrine: Diabetes [ ] ; Thyroid dysfunction[ ]  ? ?Home Medications ?Prior to Admission medications   ?Medication Sig Start Date End Date Taking? Authorizing Provider  ?atenolol (TENORMIN) 100 MG tablet Take 100 mg by mouth daily. 12/14/20  Yes [provider]  ?ELIQUIS 2.5 MG TABS tablet Take 2.5 mg by mouth 2 (two) times daily. 08/17/19  Yes [provider]  ?EUTHYROX 25 MCG tablet Take 25 mcg by mouth daily before breakfast. 03/07/20  Yes [provider]  ?furosemide (LASIX) 80 MG tablet Take 80 mg by mouth daily.   Yes [provider]  ?irbesartan (AVAPRO) 75 MG  tablet Take 1 tablet by mouth once daily ?Patient taking differently: Take 75 mg by mouth daily. 05/14/21  Yes Belva Crome, MD  ?cefpodoxime (VANTIN) 200 MG tablet Take 200 mg by mouth daily. ?Patient not taking: Reported on 04/05/2022 01/18/22   [provider]  ?doxycycline (VIBRA-TABS) 100 MG tablet Take 100 mg by mouth See admin instructions. Bid x 10 days ?Patient not taking: Reported on 03/12/2022 02/28/22   [provider]  ?metolazone (ZAROXOLYN) 5 MG tablet Take 5 mg by mouth once a week. ?Patient not taking: Reported on 03/19/2022 02/06/22   [provider]   ? ? ?Past Medical History: ?Past Medical History:  ?Diagnosis Date  ? Allergic rhinitis due to pollen   ? Atrial fibrillation ([place]) 12/23/2011  ? Atrial fibrillation with rapid ventricular response ([place])   ? Chronic diastolic HF (heart failure) ([place]) 11/21/2012  ? CKD (chronic kidney disease) stage 3, GFR 30-59 ml/min ([place]) 04/19/2019  ? Congestive heart failure, unspecified   ? patient states she is not aware of this  ? Deep venous thrombosis of lower extremity ([place]) 06/02/2012  ? Deviated septum 01/11/2020  ? Dizziness   ? DVT (deep venous thrombosis) ([facility]) left leg  ? Epistaxis 01/11/2020  ? Essential hypertension, malignant   ? GERD (gastroesophageal reflux disease)   ? Heart murmur   ? Insulin resistance   ? History of insulin resistance.  ? Insulin resistance   ? Long term current use of anticoagulant therapy 02/08/2014  ? coumadin   ? Obesity   ? Pure hypercholesterolemia   ? TIA (transient ischemic attack) 10/02/2018  ? Vertigo   ? ? ?Past Surgical History: ?Past Surgical History:  ?Procedure Laterality Date  ? ANKLE FRACTURE SURGERY Right   ? APPENDECTOMY    ? BREAST BIOPSY    ? CHOLECYSTECTOMY    ? PARTIAL HYSTERECTOMY    ? TEE WITHOUT CARDIOVERSION N/A 03/12/2021  ? Procedure: TRANSESOPHAGEAL ECHOCARDIOGRAM (TEE);  Surgeon: Werner Lean, MD;  Location: [facility] ENDOSCOPY;  Service: Cardiovascular;  Laterality: N/A;  ? ? ?Family History: ?Family History  ?Problem Relation Age of Onset  ? Diabetes Mother   ? Hypertension Sister   ? Pulmonary embolism Sister   ? ? ?Social History: ?Social History  ? ?Socioeconomic History  ? Marital status: Single  ?  Spouse name: Not on file  ? Number of children: Not on file  ? Years of education: Not on file  ? Highest education level: Not on file  ?Occupational History  ? Not on file  ?Tobacco Use  ? Smoking status: Never  ? Smokeless tobacco: Never  ?Substance and Sexual Activity  ? Alcohol use: No  ? Drug use: No  ? Sexual activity: Not Currently  ?  Birth  control/protection: Post-menopausal  ?Other Topics Concern  ? Not on file  ?Social History Narrative  ? Not on file  ? ?Social Determinants of Health  ? ?Financial Resource Strain: Not on file  ?Food Insecurity: Not on file  ?Transportation Needs: Not on file  ?Physical Activity: Not on file  ?Stress: Not on file  ?Social Connections: Not on file  ? ? ?Allergies:  ?Allergies  ?Allergen Reactions  ? Lovenox [Enoxaparin]   ? Norvasc [Amlodipine Besylate]   ?  Pt unsure   ? Sulfa Antibiotics   ?  Dizziness   ? Codeine Rash  ? Latex Rash  ? ? ?Objective:   ? ?Vital Signs:   ?Temp:  [97.7 ?F (36.5 ?C)-98.4 ?F (  36.9 ?C)] 97.7 ?F (36.5 ?C) (04/19 0418) ?Pulse Rate:  [77-85] 83 (04/19 0418) ?Resp:  [20] 20 (04/19 0418) ?BP: (130-133)/(59-84) 133/59 (04/19 0418) ?SpO2:  [99 %-100 %] 99 % (04/19 0418) ?Weight:  [72.1 kg] 72.1 kg (04/19 0418) ?Last BM Date : 03/13/2022 ? ?Weight change: ?Filed Weights  ? 03/25/22 0447 03/26/22 0533 03/27/22 0418  ?Weight: 70.3 kg 71.6 kg 72.1 kg  ? ? ?Intake/Output:  ? ?Intake/Output Summary (Last 24 hours) at 03/27/2022 1305 ?Last data filed at 03/26/2022 2347 ?Gross per 24 hour  ?Intake 480 ml  ?Output 500 ml  ?Net -20 ml  ?  ? ? ?Physical Exam  ?  ?General:  Elderly, chronically ill appearing ?HEENT: normal ?Neck: supple. JVP to jaw. Carotids 2+ bilat; no bruits.  ?Cor: PMI nondisplaced. Irregular rate & rhythm. No rubs, gallops, 2/6 TR murmur ?Lungs: clear ?Abdomen: soft, nontender, nondistended.  ?Extremities: no cyanosis, clubbing, rash, 2+ edema ?Neuro: alert & orientedx3, cranial nerves grossly intact. moves all 4 extremities w/o difficulty. Affect pleasant ? ? ?Telemetry  ? ?AF 70s-80s, 20 PVCs/min ? ?EKG  ?  ?Afib with rate 53 bpm, PVCs ? ?Labs  ? ?Basic Metabolic Panel: ?Recent Labs  ?Lab 03/27/2022 ?2126 03/21/2022 ?2147 03/24/22 ?0725 03/25/22 ?0430 03/26/22 ?4982 03/27/22 ?0244  ?NA 135 142 143 140 136 134*  ?K 5.3* 4.2 3.8 4.0 3.7 4.7  ?CL 110 114* 116* 112* 105 106  ?CO2 18*  --  16*  20* 18* 18*  ?GLUCOSE 112* 110* 91 130* 116* 117*  ?BUN 52* 66* 48* 51* 44* 48*  ?CREATININE 1.86* 2.00* 1.74* 1.82* 1.72* 1.78*  ?CALCIUM 8.5*  --  9.0 9.0 8.6* 8.7*  ?MG  --   --   --   --   --  2.0  ? ? ?Liver Function Tests

## 2022-03-27 NOTE — Assessment & Plan Note (Addendum)
Persistent RVR related atrial fibrillation, ? ?Improved rate control with amiodarone. ?Plan to transition to oral amiodarone and added mexiletine, to suppress ectopy.  ? ?

## 2022-03-27 NOTE — Assessment & Plan Note (Addendum)
Multifactorial, patient with RV failure and pulmonary hypertension. 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  ? ?Patient uses a walker for ambulation at home.  ? ?EEG with no seizures.  ?

## 2022-03-27 NOTE — Progress Notes (Addendum)
? ?Progress Note ? ?Patient Name: Jasmine [name] ?Date of Encounter: 03/27/2022 ? ?[facility] Cardiologist: Sinclair Grooms, MD  ? ?Subjective  ? ?Denies any SOB, mentioned some dizziness prior to coming to the hospital. Does not appear patient has been able to sit up while admitted.  ? ?Inpatient Medications  ?  ?Scheduled Meds: ? apixaban  2.5 mg Oral BID  ? furosemide  40 mg Oral Daily  ? levothyroxine  25 mcg Oral Q0600  ? ?Continuous Infusions: ? sodium chloride 10 mL (03/25/22 0222)  ? cefTRIAXone (ROCEPHIN)  IV 1 g (03/27/22 0245)  ? ?PRN Meds: ?sodium chloride, acetaminophen **OR** acetaminophen  ? ?Vital Signs  ?  ?Vitals:  ? 03/26/22 1224 03/26/22 1349 03/26/22 2042 03/27/22 0418  ?BP: 130/81 130/84 132/66 (!) 133/59  ?Pulse: 78 85 77 83  ?Resp: 17  20 20   ?Temp: 97.8 ?F (36.6 ?C)  98.4 ?F (36.9 ?C) 97.7 ?F (36.5 ?C)  ?TempSrc: Oral  Oral Oral  ?SpO2: 100% 99% 100% 99%  ?Weight:    72.1 kg  ? ? ?Intake/Output Summary (Last 24 hours) at 03/27/2022 0759 ?Last data filed at 03/26/2022 2347 ?Gross per 24 hour  ?Intake 480 ml  ?Output 500 ml  ?Net -20 ml  ? ? ?  03/27/2022  ?  4:18 AM 03/26/2022  ?  5:33 AM 03/25/2022  ?  4:47 AM  ?Last 3 Weights  ?Weight (lbs) 158 lb 15.2 oz 157 lb 13.6 oz 154 lb 15.7 oz  ?Weight (kg) 72.1 kg 71.6 kg 70.3 kg  ?   ? ?Telemetry  ?  ?Atrial fibrillation, rate controlled.  - Personally Reviewed ? ?ECG  ?  ?Atrial fibrillation with PVCs - Personally Reviewed ? ?Physical Exam  ? ?GEN: No acute distress.   ?Neck: No JVD ?Cardiac: irregularly irregular, no murmurs, rubs, or gallops.  ?Respiratory: Clear to auscultation bilaterally. ?GI: Soft, nontender, non-distended  ?MS: No edema; No deformity. ?Neuro:  Nonfocal  ?Psych: Normal affect  ? ?Labs  ?  ?High Sensitivity Troponin:   ?Recent Labs  ?Lab 03/14/22 ?1502 03/14/22 ?1723 03/27/2022 ?2126 03/24/22 ?0015  ?TROPONINIHS 29* 27* 33* 33*  ?   ?Chemistry ?Recent Labs  ?Lab 03/15/2022 ?2126 03/17/2022 ?2147 03/24/22 ?0725 03/25/22 ?0430  03/26/22 ?2423 03/27/22 ?0244  ?NA 135   < > 143 140 136 134*  ?K 5.3*   < > 3.8 4.0 3.7 4.7  ?CL 110   < > 116* 112* 105 106  ?CO2 18*  --  16* 20* 18* 18*  ?GLUCOSE 112*   < > 91 130* 116* 117*  ?BUN 52*   < > 48* 51* 44* 48*  ?CREATININE 1.86*   < > 1.74* 1.82* 1.72* 1.78*  ?CALCIUM 8.5*  --  9.0 9.0 8.6* 8.7*  ?MG  --   --   --   --   --  2.0  ?PROT 7.2  --  7.0  --   --  6.7  ?ALBUMIN 3.3*  --  3.0*  --   --  2.8*  ?AST 64*  --  43*  --   --  36  ?ALT 33  --  26  --   --  21  ?ALKPHOS 156*  --  131*  --   --  142*  ?BILITOT 3.1*  --  2.9* 1.8*  --  1.8*  ?GFRNONAA 26*  --  28* 27* 29* 28*  ?ANIONGAP 7  --  11 8 13 10   ? < > =  values in this interval not displayed.  ?  ?Lipids No results for input(s): CHOL, TRIG, HDL, LABVLDL, LDLCALC, CHOLHDL in the last 168 hours.  ?Hematology ?Recent Labs  ?Lab 03/26/2022 ?2126 03/22/2022 ?2147 03/24/22 ?0725 03/27/22 ?0244  ?WBC 4.2  --  5.4 5.0  ?RBC 3.88  --  3.87 3.68*  ?HGB 13.1 13.9 12.8 12.2  ?HCT 41.3 41.0 42.0 37.1  ?MCV 106.4*  --  108.5* 100.8*  ?MCH 33.8  --  33.1 33.2  ?MCHC 31.7  --  30.5 32.9  ?RDW 17.2*  --  17.2* 16.8*  ?PLT 73*  --  62* 74*  ? ?Thyroid  ?Recent Labs  ?Lab 03/24/22 ?0725  ?TSH 5.836*  ?  ?BNP ?Recent Labs  ?Lab 03/22/2022 ?2126  ?BNP 1,555.0*  ?  ?DDimer No results for input(s): DDIMER in the last 168 hours.  ? ?Radiology  ?  ?No results found. ? ?Cardiac Studies  ? ?Echocardiogram 03/24/2022 ?  ?1. Left ventricular ejection fraction, by estimation, is 55 to 60%. The left ventricle has normal function. The left ventricle has no regional wall motion abnormalities. There is mild left ventricular hypertrophy. Left ventricular diastolic parameters are indeterminate.   ?2. Ventricular septum is flattened in systole and diastole consistent with RV pressure and volume overload. . Right ventricular systolic function is moderately reduced. The right ventricular size is severely enlarged. There is mildly elevated pulmonary artery systolic pressure.   ?3. Left  atrial size was severely dilated.   ?4. Right atrial size was severely dilated.   ?5. A small pericardial effusion is present. The pericardial effusion is circumferential.   ?6. The mitral valve is abnormal. Mild mitral valve regurgitation. No evidence of mitral stenosis.   ?7. Hepatic systolic flow reversal consistent with severe TR. . The tricuspid valve is abnormal. Tricuspid valve regurgitation is severe.   ?8. The aortic valve is tricuspid. There is mild calcification of the aortic valve. There is mild thickening of the aortic valve. Aortic valve regurgitation is not visualized. No aortic stenosis is present.   ?9. The inferior vena cava is dilated in size with <50% respiratory variability, suggesting right atrial pressure of 15 mmHg.  ? ?Patient Profile  ?   ?88 y.o. female with PMH of permanent atrial fibrillation, CKD stage IV, right heart failure, severe TR, HTN and TIA who presented with a syncope episode ? ?Assessment & Plan  ?  ?Syncope ?            - likely preload dependent due to RV failure and low cardiac output ?            - avoid significant diuresis which may cause hypovolemia ?  ?Right heart failure:  ? - significant tricuspid regurgitation, moderate pulmonary artery HTN,. Low albumin level. ?            - received 40mg  IV lasix 3 days ago. Has been receiving 25mg  spironolactone. 1+ ankle edema, lung is clear.   ? - currently on lasix, reduce spironolactone given hyponatremia. Need PT eval ?  ?CKD stage IV: stable with diuresis.  ?  ?Permanent atrial fibrillation: self rate controlled. Was on atenolol 100mg  at home, held on admission, HR 70s in the hospital.  ?  ?LLE Cellulitis: s/p IV ceftriaxone ? ?Hyponatremia: sodium trending down, reduce spironolactone to 12.5mg  daily ?  ? ?For questions or updates, please contact [place] ?Please consult [email or web address] for contact info under  ? ?  ?   ?Signed, ?Almyra Deforest, [place]  ?  03/27/2022, 7:59 AM   ? ?I have seen and examined the patient along with Almyra Deforest, PA .  I have reviewed the chart, notes and new data.  I agree with PA/NP's note. ? ?Key new complaints: no dyspnea lying flat ?Key examination changes: V waves to angle of jaw, persistent leg edema ?Key new findings / data: mild hyponatremia, unchanged renal parameters ? ?PLAN: ?Still hypervolemic, but concerned that additional diuresis would lead to low cardiac output state and worsening renal function and hyponatremia, unless we provide inotropic support. ?Not sure of benefit of inotropes in the long term, in view of the severity of right heart disease. ?Restrictive (amyloid) cardiomyopathy considered, again not sure of benefit of treatment at this point. ?D/W Dr. Tamala Julian. Will ask for Dr. Clayborne [name] opinion on these issues. ? ?Sanda Klein, MD, [facility] ?[facility] ?[phone] ?03/27/2022, 11:42 AM ? ?

## 2022-03-27 NOTE — Consult Note (Signed)
WOC Nurse Consult Note: ?Reason for Consult:Bilateral LEs with scattered areas of weeping dermatitis, no wounds ?Wound type: venous insufficiency ?Pressure Injury POA: N/A ?Measurement:N/A ?Wound bed:N/A ?Drainage (amount, consistency, odor) small serous ?Periwound: intact, edematous ?Dressing procedure/placement/frequency: Gentle compression is indicated as well as pressure relief to heels and daily topical care consisting of cleansing and covering the weeping areas with an antimicrobial nonadherent dressing that will donate astringent properties (xeroform). A pressure redistribution chair cushion is provided to use while OOB in the chair here as well as downstream in the next care setting. Please send with patient at time of discharge. ? ?[place] nursing team will not follow, but will remain available to this patient, the nursing and medical teams.  Please re-consult if needed. ?Thanks, ?Maudie Flakes, MSN, RN, [place], [place], CWON-AP, [place]  ?Pager# [phone]  ? ? ? ?  ?

## 2022-03-27 NOTE — Assessment & Plan Note (Addendum)
Echocardiogram with preserved LV systolic function EF 55 to 60%, ventricular septum flattened in systole and diastole consistent with RV pressure and volume overload. Moderate reduction in RV systolic dysfunction, RV cavity with severe enlargement, RVSP 44,8. Severe dilatation of bilateral atriums. Small pericardial effusion. Severe tricuspid valve regurgitation.  ? ?Pulmonary hypertension with severe RV failure.  ?04/20 cardiac catheterization with mean PA 42, PCWP 29, Cardiac output/ index 3.6 and 2,0 (Fick) and 3.1 and 1.7 (thermodilution).  ? ?Urine output documented 5000 cc over last 24 hrs  ?Systolic blood pressure 735 to 150 mmHg.  ? ?On inotropic support with milrinone ? ?04/22 metolazone 2.5 mg ?04/24 resume furosemide 120 mg q 8 hrs.   ?04/25 transitioned to oral torsemide.  ? ?Holding  spironolactone in the setting of worsening renal function.  ?Out of bed to chair, continue to encourage mobility.  ? ?Positive edema, despite aggressive medical therapy, likely patient in a end stage heart failure condition.  ? ? ? ?

## 2022-03-28 ENCOUNTER — Encounter (HOSPITAL_COMMUNITY): Admission: EM | Disposition: E | Payer: Self-pay | Source: Home / Self Care | Attending: Internal Medicine

## 2022-03-28 DIAGNOSIS — I5033 Acute on chronic diastolic (congestive) heart failure: Secondary | ICD-10-CM | POA: Diagnosis not present

## 2022-03-28 DIAGNOSIS — R55 Syncope and collapse: Secondary | ICD-10-CM | POA: Diagnosis not present

## 2022-03-28 DIAGNOSIS — I50810 Right heart failure, unspecified: Secondary | ICD-10-CM

## 2022-03-28 DIAGNOSIS — N184 Chronic kidney disease, stage 4 (severe): Secondary | ICD-10-CM | POA: Diagnosis not present

## 2022-03-28 DIAGNOSIS — I4821 Permanent atrial fibrillation: Secondary | ICD-10-CM | POA: Diagnosis not present

## 2022-03-28 HISTORY — PX: RIGHT HEART CATH: CATH118263

## 2022-03-28 LAB — BASIC METABOLIC PANEL
Anion gap: 11 (ref 5–15)
BUN: 47 mg/dL — ABNORMAL HIGH (ref 8–23)
CO2: 18 mmol/L — ABNORMAL LOW (ref 22–32)
Calcium: 9.2 mg/dL (ref 8.9–10.3)
Chloride: 106 mmol/L (ref 98–111)
Creatinine, Ser: 1.74 mg/dL — ABNORMAL HIGH (ref 0.44–1.00)
GFR, Estimated: 28 mL/min — ABNORMAL LOW (ref >60)
Glucose, Bld: 118 mg/dL — ABNORMAL HIGH (ref 70–99)
Potassium: 5.1 mmol/L (ref 3.5–5.1)
Sodium: 135 mmol/L (ref 135–145)

## 2022-03-28 LAB — POCT I-STAT EG7
Acid-base deficit: 2 mmol/L (ref 0.0–2.0)
Acid-base deficit: 2 mmol/L (ref 0.0–2.0)
Acid-base deficit: 5 mmol/L — ABNORMAL HIGH (ref 0.0–2.0)
Bicarbonate: 19.3 mmol/L — ABNORMAL LOW (ref 20.0–28.0)
Bicarbonate: 21.6 mmol/L (ref 20.0–28.0)
Bicarbonate: 22.2 mmol/L (ref 20.0–28.0)
Calcium, Ion: 0.95 mmol/L — ABNORMAL LOW (ref 1.15–1.40)
Calcium, Ion: 1.2 mmol/L (ref 1.15–1.40)
Calcium, Ion: 1.2 mmol/L (ref 1.15–1.40)
HCT: 38 % (ref 36.0–46.0)
HCT: 40 % (ref 36.0–46.0)
HCT: 41 % (ref 36.0–46.0)
Hemoglobin: 12.9 g/dL (ref 12.0–15.0)
Hemoglobin: 13.6 g/dL (ref 12.0–15.0)
Hemoglobin: 13.9 g/dL (ref 12.0–15.0)
O2 Saturation: 51 %
O2 Saturation: 52 %
O2 Saturation: 58 %
Potassium: 4.4 mmol/L (ref 3.5–5.1)
Potassium: 5.2 mmol/L — ABNORMAL HIGH (ref 3.5–5.1)
Potassium: 5.5 mmol/L — ABNORMAL HIGH (ref 3.5–5.1)
Sodium: 136 mmol/L (ref 135–145)
Sodium: 136 mmol/L (ref 135–145)
Sodium: 140 mmol/L (ref 135–145)
TCO2: 20 mmol/L — ABNORMAL LOW (ref 22–32)
TCO2: 23 mmol/L (ref 22–32)
TCO2: 23 mmol/L (ref 22–32)
pCO2, Ven: 31.3 mmHg — ABNORMAL LOW (ref 44–60)
pCO2, Ven: 34 mmHg — ABNORMAL LOW (ref 44–60)
pCO2, Ven: 34.3 mmHg — ABNORMAL LOW (ref 44–60)
pH, Ven: 7.397 (ref 7.25–7.43)
pH, Ven: 7.411 (ref 7.25–7.43)
pH, Ven: 7.42 (ref 7.25–7.43)
pO2, Ven: 26 mmHg — CL (ref 32–45)
pO2, Ven: 27 mmHg — CL (ref 32–45)
pO2, Ven: 30 mmHg — CL (ref 32–45)

## 2022-03-28 SURGERY — RIGHT HEART CATH
Anesthesia: LOCAL

## 2022-03-28 MED ORDER — SODIUM CHLORIDE 0.9% FLUSH
3.0000 mL | Freq: Two times a day (BID) | INTRAVENOUS | Status: DC
  Administered 2022-03-28 – 2022-04-03 (×8): 3 mL via INTRAVENOUS

## 2022-03-28 MED ORDER — LIDOCAINE HCL (PF) 1 % IJ SOLN
INTRAMUSCULAR | Status: AC
  Filled 2022-03-28: qty 30

## 2022-03-28 MED ORDER — LIDOCAINE HCL (PF) 1 % IJ SOLN
INTRAMUSCULAR | Status: DC | PRN
  Administered 2022-03-28 (×2): 5 mL

## 2022-03-28 MED ORDER — APIXABAN 2.5 MG PO TABS
2.5000 mg | ORAL_TABLET | Freq: Two times a day (BID) | ORAL | Status: DC
  Administered 2022-03-29 – 2022-04-05 (×16): 2.5 mg via ORAL
  Filled 2022-03-28 (×17): qty 1

## 2022-03-28 MED ORDER — SODIUM CHLORIDE 0.9 % IV SOLN: INTRAVENOUS | Status: DC

## 2022-03-28 MED ORDER — ONDANSETRON HCL 4 MG/2ML IJ SOLN
4.0000 mg | Freq: Four times a day (QID) | INTRAMUSCULAR | Status: DC | PRN
  Administered 2022-03-29 – 2022-04-03 (×5): 4 mg via INTRAVENOUS
  Filled 2022-03-28 (×5): qty 2

## 2022-03-28 MED ORDER — AMIODARONE HCL IN DEXTROSE 360-4.14 MG/200ML-% IV SOLN
30.0000 mg/h | INTRAVENOUS | Status: DC
  Administered 2022-03-28 – 2022-04-02 (×10): 30 mg/h via INTRAVENOUS
  Filled 2022-03-28 (×10): qty 200

## 2022-03-28 MED ORDER — AMIODARONE HCL IN DEXTROSE 360-4.14 MG/200ML-% IV SOLN
60.0000 mg/h | INTRAVENOUS | Status: DC
  Administered 2022-03-28: 60 mg/h via INTRAVENOUS
  Filled 2022-03-28: qty 200

## 2022-03-28 MED ORDER — SODIUM CHLORIDE 0.9 % IV SOLN: 250.0000 mL | INTRAVENOUS | Status: DC | PRN

## 2022-03-28 MED ORDER — HEPARIN (PORCINE) IN NACL 1000-0.9 UT/500ML-% IV SOLN
INTRAVENOUS | Status: DC | PRN
  Administered 2022-03-28: 500 mL

## 2022-03-28 MED ORDER — SODIUM CHLORIDE 0.9% FLUSH
3.0000 mL | Freq: Two times a day (BID) | INTRAVENOUS | Status: DC
  Administered 2022-03-28 (×2): 3 mL via INTRAVENOUS

## 2022-03-28 MED ORDER — ACETAMINOPHEN 325 MG PO TABS
650.0000 mg | ORAL_TABLET | ORAL | Status: DC | PRN
  Administered 2022-04-02: 650 mg via ORAL
  Filled 2022-03-28: qty 2

## 2022-03-28 MED ORDER — SODIUM CHLORIDE 0.9 % IV SOLN: Freq: Once | INTRAVENOUS | Status: DC

## 2022-03-28 MED ORDER — MILRINONE LACTATE IN DEXTROSE 20-5 MG/100ML-% IV SOLN
0.2500 ug/kg/min | INTRAVENOUS | Status: DC
  Administered 2022-03-28 – 2022-04-01 (×4): 0.125 ug/kg/min via INTRAVENOUS
  Administered 2022-04-02: 0.25 ug/kg/min via INTRAVENOUS
  Filled 2022-03-28 (×5): qty 100

## 2022-03-28 MED ORDER — FUROSEMIDE 10 MG/ML IJ SOLN
80.0000 mg | Freq: Two times a day (BID) | INTRAMUSCULAR | Status: DC
Start: 2022-03-28 — End: 2022-03-31
  Administered 2022-03-28 – 2022-03-30 (×5): 80 mg via INTRAVENOUS
  Filled 2022-03-28 (×5): qty 8

## 2022-03-28 MED ORDER — AMIODARONE IV BOLUS ONLY 150 MG/100ML
150.0000 mg | Freq: Once | INTRAVENOUS | Status: AC
  Administered 2022-03-28: 150 mg via INTRAVENOUS
  Filled 2022-03-28: qty 100

## 2022-03-28 MED ORDER — SODIUM CHLORIDE 0.9% FLUSH: 3.0000 mL | INTRAVENOUS | Status: DC | PRN

## 2022-03-28 MED ORDER — HEPARIN (PORCINE) IN NACL 1000-0.9 UT/500ML-% IV SOLN
INTRAVENOUS | Status: AC
  Filled 2022-03-28: qty 500

## 2022-03-28 MED ORDER — LABETALOL HCL 5 MG/ML IV SOLN: 10.0000 mg | INTRAVENOUS | Status: AC | PRN

## 2022-03-28 MED ORDER — HYDRALAZINE HCL 20 MG/ML IJ SOLN: 10.0000 mg | INTRAMUSCULAR | Status: AC | PRN

## 2022-03-28 SURGICAL SUPPLY — 8 items
CATH SWAN GANZ 7F STRAIGHT (CATHETERS) ×1 IMPLANT
GUIDEWIRE .025 260CM (WIRE) ×1 IMPLANT
KIT MICROPUNCTURE NIT STIFF (SHEATH) ×1 IMPLANT
PACK CARDIAC CATHETERIZATION (CUSTOM PROCEDURE TRAY) ×2 IMPLANT
SHEATH GLIDE SLENDER 4/5FR (SHEATH) IMPLANT
SHEATH PINNACLE 7F 10CM (SHEATH) ×1 IMPLANT
SHEATH PROBE COVER 6X72 (BAG) ×1 IMPLANT
TRANSDUCER W/STOPCOCK (MISCELLANEOUS) ×2 IMPLANT

## 2022-03-28 NOTE — TOC CM/SW Note (Signed)
HF TOC CM attempted to speak to pt at bedside. Pt wants CM speak to family. Unable to answer questions just nod yes or no. Attempted call to pt's sister, [name] no voicemail box to leave message. Waiting PT/OT recommendations. Will continue to follow for dc needs. Jonnie Finner RN3 CCM, Heart Failure TOC CM [phone]  ?

## 2022-03-28 NOTE — Progress Notes (Addendum)
?Progress Note ? ? ?Patient: Jasmine [name] IRW:[id number] DOB: May 20, 1934 DOA: 03/28/2022     4 ?DOS: the patient was seen and examined on 03/13/2022 ?  ?Brief hospital course: ?Jasmine [name] was admitted to the hospital with the working diagnosis of decompensated heart failure, advanced RV failure and pulmonary hypertension.  ? ?88 yo female with the past medical history of chronic atrial fibrillation, diastolic heart failure, hypertension, chronic kidney disease stage 4, hypothyroidism, TIA, vertigo and DVT, who presented after a syncope episode. She was found on her recliner not responsive for 15 minutes, EMS was called, she slowly recovered her consciousness and she was transported to the ED. On her initial physical examination her blood pressure was 135/84, HR 38 to 58, RR 17 and 02 saturation 100%, lungs with no wheezing or rales, heart with S1 and S2 present and irregular, abdomen not distended, positive ++++ lower extremity edema with blisters.  ? ?Na 135, K 5,3 cl 110, bicarbonate 18, glucose 112 bun 52 cr 1,86 ?BNP 1,555 ?High sensitive troponin is 33 ?Wbc 4.2 hgb 13.1 plt  73  ?Sars covid 19 negative  ? ?Head CT negative for acute changes.  ? ?Chest radiograph with cardiomegaly, bilateral hilar vascular congestion. ? ?EKG 53 bpm, right axis, normal qtc, atrial fibrillation rhythm, with no significant ST segment or T wave changes.  ? ?Patient was placed on furosemide for diuresis. ?Echocardiogram with significant tricuspid regurgitation and moderate pulmonary hypertension.  ? ?Plan for right heart catheterization.  ? ?Assessment and Plan: ?* Acute on chronic diastolic CHF (congestive heart failure) ([place]) ?Echocardiogram with preserved LV systolic function EF 55 to 60%, ventricular septum flattened in systole and diastole consistent with RV pressure and volume overload. Moderate reduction in RV systolic dysfunction, RV cavity with severe enlargement, RVSP 44,8. Severe dilatation of bilateral atriums. Small  pericardial effusion. Severe tricuspid valve regurgitation.  ? ?Pulmonary hypertension with sever RV failure.  ? ?Urine output is 300 cc  ?Systolic blood pressure 761 to 129 mmHg range.  ?Continue to have signs of volume overload.  ? ?To consider increase diuretic dose to target further negative fluid balance.  ?Continue with Spironolactone 12.5 mg daily.  ? ?Atrial fibrillation ([place]) ?Patient with slow rate atrial fibrillation. ?Continue to hold on AV blocking agents ?Continue anticoagulation with apixaban.  ? ?Hypertension ?Continue diuresis for hypervolemia.  ? ?Stage 4 chronic kidney disease ([place]) ?Hyponatremia, non anion gap metabolic acidosis.  ? ?Patient continue hypervolemic with poor urine output.  ?Renal function today with serum cr at 1.74, K 5,1 and serum bicarbonate 18  ? ?To consider increase dose of furosemide, if continue worsening hyperkalemia, may have to discontinue spironolactone.  ?Follow up renal function in am, avoid hypotension and nephrotoxic medications.  ? ?Syncope ?Multifactorial, patient with RV failure and pulmonary hypertension. 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  ?Continue PT and OT.  ?Patient uses a walker for ambulation at home.  ? ?EEG with no seizures.  ? ?Cellulitis ?Significant improvement in rash bilateral legs. Likely skin changes due to peripheral vascular disease. ? ?Antibiotic therapy has been discontinued.  ? ?Hypothyroidism ?Continue with levothyroxine  ? ? ? ? ?  ? ?Subjective: Patient with confusion and disorientation this morning, no agitation, continue to have dyspnea, and edema  ? ?Physical Exam: ?Vitals:  ? 03/27/22 0418 03/27/22 1442 03/27/22 2006 03/29/2022 0445  ?BP: (!) 133/59 (!) 144/83 (!) 162/88 129/80  ?Pulse: 83 64 99 88  ?Resp: 20 18 20 20   ?Temp: 97.7 ?F (36.5 ?C) Mar[place] Kitchen)  97.5 ?F (36.4 ?C) 97.7 ?F (36.5 ?C) 97.9 ?F (36.6 ?C)  ?TempSrc: Oral Axillary Oral Oral  ?SpO2: 99% 99% 98% 99%  ?Weight: 72.1 kg   72.4 kg  ? ?Neurology awake and alert, not focal, but positive  confusion and disorientation  ?ENT with mild pallor ?Cardiovascular with S1 and S2 present, loud P2, no murmurs, no gallops, positive JVD severe. ?Lower extremity edema ++ up to hips and abdomen, has wraps on her lower extremities  ?Respiratory with bilateral rales but no wheezing ?Abdomen not distended  ?Data Reviewed: ? ? ? ?Family Communication: no family at the bedside  ? ?Disposition: ?Status is: Inpatient ?Remains inpatient appropriate because: heart failure  ? Planned Discharge Destination: Home ? ?Author: ?Tawni Millers, MD ?04/05/2022 9:56 AM ? ?For on call review [email or web address].  ?

## 2022-03-28 NOTE — Interval H&P Note (Signed)
History and Physical Interval Note: ? ?03/20/2022 ?2:37 PM ? ?[name]  has presented today for surgery, with the diagnosis of RV Failure.  The various methods of treatment have been discussed with the patient and family. After consideration of risks, benefits and other options for treatment, the patient has consented to  Procedure(s): ?RIGHT HEART CATH (N/A) as a surgical intervention.  The patient's history has been reviewed, patient examined, no change in status, stable for surgery.  I have reviewed the patient's chart and labs.  Questions were answered to the patient's satisfaction.   ? ? ?Jasmine [name] ? ? ?

## 2022-03-28 NOTE — Progress Notes (Addendum)
? ? Advanced Heart Failure Rounding Note ? ?PCP-Cardiologist: Sinclair Grooms, MD  ? ?Subjective:   ? ?Yesterday diuresed with IV lasix. Set up for RHC today.  ? ?SOB with exertion. Complaining heart beating fast.  ? ?Objective:   ?Weight Range: ?72.4 kg ?Body mass index is 27.4 kg/m?.  ? ?Vital Signs:   ?Temp:  [97.5 ?F (36.4 ?C)-97.9 ?F (36.6 ?C)] 97.9 ?F (36.6 ?C) (04/20 0445) ?Pulse Rate:  [64-99] 88 (04/20 0445) ?Resp:  [18-20] 20 (04/20 0445) ?BP: (129-162)/(80-88) 129/80 (04/20 0445) ?SpO2:  [98 %-99 %] 99 % (04/20 0445) ?Weight:  [72.4 kg] 72.4 kg (04/20 0445) ?Last BM Date : 03/10/2022 ? ?Weight change: ?Filed Weights  ? 03/26/22 0533 03/27/22 0418 04/04/2022 0445  ?Weight: 71.6 kg 72.1 kg 72.4 kg  ? ? ?Intake/Output:  ? ?Intake/Output Summary (Last 24 hours) at 04/05/2022 1001 ?Last data filed at 04/02/2022 0447 ?Gross per 24 hour  ?Intake --  ?Output 300 ml  ?Net -300 ml  ?  ? ? ?Physical Exam  ?  ?General:  Elderly. No resp difficulty ?HEENT: Normal ?Neck: Supple. JVP 8-9 . Carotids 2+ bilat; no bruits. No lymphadenopathy or thyromegaly appreciated. ?Cor: PMI nondisplaced. Tachy Irregular rate & rhythm. No rubs, gallops or murmurs. ?Lungs: Clear ?Abdomen: Soft, nontender, nondistended. No hepatosplenomegaly. No bruits or masses. Good bowel sounds. ?Extremities: No cyanosis, clubbing, rash, R and LLE ace wraps ?Neuro: Alert & orientedx3, cranial nerves grossly intact. moves all 4 extremities w/o difficulty. Affect pleasant ? ? ?Telemetry  ? ?A fib RVR with multiple runs NSVT ? ?EKG  ?  ?N/A ? ?Labs  ?  ?CBC ?Recent Labs  ?  03/27/22 ?0244  ?WBC 5.0  ?HGB 12.2  ?HCT 37.1  ?MCV 100.8*  ?PLT 74*  ? ?Basic Metabolic Panel ?Recent Labs  ?  03/27/22 ?0244 03/22/2022 ?0906  ?NA 134* 135  ?K 4.7 5.1  ?CL 106 106  ?CO2 18* 18*  ?GLUCOSE 117* 118*  ?BUN 48* 47*  ?CREATININE 1.78* 1.74*  ?CALCIUM 8.7* 9.2  ?MG 2.0  --   ? ?Liver Function Tests ?Recent Labs  ?  03/27/22 ?0244  ?AST 36  ?ALT 21  ?ALKPHOS 142*  ?BILITOT  1.8*  ?PROT 6.7  ?ALBUMIN 2.8*  ? ?No results for input(s): LIPASE, AMYLASE in the last 72 hours. ?Cardiac Enzymes ?No results for input(s): CKTOTAL, CKMB, CKMBINDEX, TROPONINI in the last 72 hours. ? ?BNP: ?BNP (last 3 results) ?Recent Labs  ?  03/14/22 ?1502 03/22/2022 ?2126  ?BNP 1,424.0* 1,555.0*  ? ? ?ProBNP (last 3 results) ?No results for input(s): PROBNP in the last 8760 hours. ? ? ?D-Dimer ?No results for input(s): DDIMER in the last 72 hours. ?Hemoglobin A1C ?No results for input(s): HGBA1C in the last 72 hours. ?Fasting Lipid Panel ?No results for input(s): CHOL, HDL, LDLCALC, TRIG, CHOLHDL, LDLDIRECT in the last 72 hours. ?Thyroid Function Tests ?No results for input(s): TSH, T4TOTAL, T3FREE, THYROIDAB in the last 72 hours. ? ?Invalid input(s): FREET3 ? ?Other results: ? ? ?Imaging  ? ? ?No results found. ? ? ?Medications:   ? ? ?Scheduled Medications: ? apixaban  2.5 mg Oral BID  ? furosemide  40 mg Oral Daily  ? levothyroxine  25 mcg Oral Q0600  ? sodium chloride flush  3 mL Intravenous Q12H  ? spironolactone  12.5 mg Oral Daily  ? ? ?Infusions: ? sodium chloride 10 mL (03/25/22 0222)  ? sodium chloride    ? sodium chloride    ? ? ?  PRN Medications: ?sodium chloride, sodium chloride, acetaminophen **OR** acetaminophen, sodium chloride flush ? ? ? ?Patient Profile  ? ?  ?88 y/o woman with chronic AF, CKD IV and severe RV failure admitted with volume overload and recent syncope. Has been struggling with fluid management which has been c/b CKD.  ? ?Assessment/Plan  ? ?Syncope: ?-Felt to be likely d/t preload dependency in setting of RV failure and possible low-output ?-On atenolol 100 mg daily at home - held on admit (HR 50s).  ?-No significant pauses. Now with A fib RVR. . ?  ?2. HFpEF with RV failure: ?-Echo: EF 55-60%, ventricular septum flattened in systolic and diastole c/w RV pressure and volume overload, RV moderately reduced, RV severely enlarged, severe BAE, mild MR, severe TR, RVSP 45 mmHg ?-BNP  1,555 ?-? Low output. Lactic acid 1.3 on admit. However, CO2 18>16>20>18. Scr ranging between 1.7-1.8 which is near baseline ?-Volume status improved after IV lasix. Continue 40 mg furosemide PO daily. ?-Adjust diuretics post cath.   ?-Continue spiro 12.5 mg daily. Continue current dose K 5.1  ?  ?3. Severe Tricuspid Regurgitation ?-See echo above ?-RHC today  ?  ?4. Permanent atrial fibrillation: ?-Uncontrolled rate. WIll need to add amio drip for rate control and to suppress PVCs.  ?-continue Eliquis 2.5 mg BID (appropriate dose given age and renal function) ?  ?5. Hypothyroidism: ?-On levothyroxine ?-TSH 5.8 ?  ?6. PVCs: ?-likely d/t dilated RV ?-Keep K > 4 and Mag > 2. High PVC burden with NSVT.  ?- As above adding amio drip.  ? ? ?Length of Stay: 4 ? ?Darrick Grinder, NP  ?03/29/2022, 10:01 AM ? ?Advanced Heart Failure Team ?Pager [phone] (M-F; 7a - 5p)  ?Please contact [facility] for night-coverage after hours (5p -7a ) and weekends on [email or web address] ? ?Patient seen and examined with the above-signed Advanced Practice Provider and/or Housestaff. I personally reviewed laboratory data, imaging studies and relevant notes. I independently examined the patient and formulated the important aspects of the plan. I have edited the note to reflect any of my changes or salient points. I have personally discussed the plan with the patient and/or family. ? ?Remains weak and SOB with minimal activity. + palpitations. Scr stable.  ? ?General:  Lying in bed. weak appearing. No resp difficulty ?HEENT: normal ?Neck: supple. JVP to ear. Carotids 2+ bilat; no bruits. No lymphadenopathy or thryomegaly appreciated. ?Cor: PMI nondisplaced. Irregular rate & rhythm. 3/6 TR ?Lungs: clear ?Abdomen: soft, nontender, nondistended. No hepatosplenomegaly. No bruits or masses. Good bowel sounds. ?Extremities: no cyanosis, clubbing, rash, 1-2+ edema ?Neuro: alert & oriented x 3, cranial nerves grossly intact. moves all 4 extremities w/o difficulty.  Affect pleasant ? ?She remains very tenuous with probable end-stage RV failure. Appears volume overloaded but not responding well to diuretics. Plan RHC today. Continue Eliquis for AF. May be getting close to a Palliative situation.  ? ?Glori Bickers, MD  ?4:39 PM ? ? ? ?

## 2022-03-28 NOTE — Care Management Important Message (Signed)
Important Message ? ?Patient Details  ?Name: Jasmine [name] ?MRN: [id number] ?Date of Birth: 28-May-1934 ? ? ?Medicare Important Message Given:  Yes ? ? ? ? ?Shelda Altes ?03/21/2022, 9:29 AM ?

## 2022-03-29 ENCOUNTER — Encounter (HOSPITAL_COMMUNITY): Payer: Self-pay | Admitting: Internal Medicine

## 2022-03-29 DIAGNOSIS — I4821 Permanent atrial fibrillation: Secondary | ICD-10-CM | POA: Diagnosis not present

## 2022-03-29 DIAGNOSIS — R55 Syncope and collapse: Secondary | ICD-10-CM | POA: Diagnosis not present

## 2022-03-29 DIAGNOSIS — I5033 Acute on chronic diastolic (congestive) heart failure: Secondary | ICD-10-CM | POA: Diagnosis not present

## 2022-03-29 DIAGNOSIS — I10 Essential (primary) hypertension: Secondary | ICD-10-CM | POA: Diagnosis not present

## 2022-03-29 LAB — BASIC METABOLIC PANEL
Anion gap: 10 (ref 5–15)
BUN: 48 mg/dL — ABNORMAL HIGH (ref 8–23)
CO2: 18 mmol/L — ABNORMAL LOW (ref 22–32)
Calcium: 9.2 mg/dL (ref 8.9–10.3)
Chloride: 104 mmol/L (ref 98–111)
Creatinine, Ser: 1.88 mg/dL — ABNORMAL HIGH (ref 0.44–1.00)
GFR, Estimated: 26 mL/min — ABNORMAL LOW (ref >60)
Glucose, Bld: 156 mg/dL — ABNORMAL HIGH (ref 70–99)
Potassium: 4.9 mmol/L (ref 3.5–5.1)
Sodium: 132 mmol/L — ABNORMAL LOW (ref 135–145)

## 2022-03-29 LAB — MAGNESIUM
Magnesium: 2.2 mg/dL (ref 1.7–2.4)
Magnesium: 2.1 mg/dL (ref 1.7–2.4)

## 2022-03-29 MED ORDER — NITROGLYCERIN 0.4 MG SL SUBL
0.4000 mg | SUBLINGUAL_TABLET | SUBLINGUAL | Status: DC | PRN
  Administered 2022-03-29 (×2): 0.4 mg via SUBLINGUAL

## 2022-03-29 MED ORDER — HYDROMORPHONE HCL 1 MG/ML IJ SOLN
0.5000 mg | INTRAMUSCULAR | Status: DC | PRN
  Administered 2022-03-29 – 2022-03-30 (×2): 0.5 mg via INTRAVENOUS
  Filled 2022-03-29 (×2): qty 1

## 2022-03-29 MED ORDER — NITROGLYCERIN 0.4 MG SL SUBL
SUBLINGUAL_TABLET | SUBLINGUAL | Status: AC
  Administered 2022-03-29: 0.4 mg via SUBLINGUAL
  Filled 2022-03-29: qty 1

## 2022-03-29 NOTE — Progress Notes (Signed)
?  Mobility Specialist Criteria Algorithm Info. ? ? ? 03/29/22 1225  ?Mobility  ?Activity Ambulated with assistance in room;Transferred from bed to chair  ?Range of Motion/Exercises Active;All extremities  ?Level of Assistance Minimal assist, patient does 75% or more  ?Assistive Device Front wheel walker  ?Distance Ambulated (ft) 10 ft  ?Activity Response Tolerated well  ? ?HR pre= 95 ?HR during= 140 peak ?HR post= 98 ? ?Patient received supine agreeable to participate. Was mod A for bed mobility requiring VC to use bed rail to assist to EOB. Pt stood with mod A and ambulated short distance around the bed to recliner. LOBx4 requiring min A to steady. Tolerated short ambulation without incident but needs assistance for safety. Pt is at high risk for fall. Was left in recliner with all needs met, call bell in reach. ? ?03/29/2022 ?4:06 PM ? ?Jasmine [name], CMS, BS EXP ?Acute Rehabilitation Services  ?FTZOQ:[phone] ?Office: [phone] ? ?

## 2022-03-29 NOTE — Progress Notes (Signed)
?Progress Note ? ? ?Patient: Jasmine [name] VOH:[id number] DOB: 08-Nov-1934 DOA: 04/07/2022     5 ?DOS: the patient was seen and examined on 03/29/2022 ?  ?Brief hospital course: ?Mrs. Jasmine [name] was admitted to the hospital with the working diagnosis of decompensated heart failure, advanced RV failure and pulmonary hypertension.  ? ?88 yo female with the past medical history of chronic atrial fibrillation, diastolic heart failure, hypertension, chronic kidney disease stage 4, hypothyroidism, TIA, vertigo and DVT, who presented after a syncope episode. She was found on her recliner not responsive for 15 minutes, EMS was called, she slowly recovered her consciousness and she was transported to the ED. On her initial physical examination her blood pressure was 135/84, HR 38 to 58, RR 17 and 02 saturation 100%, lungs with no wheezing or rales, heart with S1 and S2 present and irregular, abdomen not distended, positive ++++ lower extremity edema with blisters.  ? ?Na 135, K 5,3 cl 110, bicarbonate 18, glucose 112 bun 52 cr 1,86 ?BNP 1,555 ?High sensitive troponin is 33 ?Wbc 4.2 hgb 13.1 plt  73  ?Sars covid 19 negative  ? ?Head CT negative for acute changes.  ? ?Chest radiograph with cardiomegaly, bilateral hilar vascular congestion. ? ?EKG 53 bpm, right axis, normal qtc, atrial fibrillation rhythm, with no significant ST segment or T wave changes.  ? ?Patient was placed on furosemide for diuresis. ?Echocardiogram with significant tricuspid regurgitation and moderate pulmonary hypertension.  ? ?Cardiac catheterization with low cardiac output 3.6 and index 2, (Fick), and thermodilution 3,1 and 1.7.  ?PCWP 29 and mean PA 42.  ?Patient was placed on inotropic support and further aggressive diuresis.  ?Amiodarone for atrial fibrillation.  ? ?Assessment and Plan: ?* Acute on chronic diastolic CHF (congestive heart failure) ([place]) ?Echocardiogram with preserved LV systolic function EF 55 to 60%, ventricular septum flattened in  systole and diastole consistent with RV pressure and volume overload. Moderate reduction in RV systolic dysfunction, RV cavity with severe enlargement, RVSP 44,8. Severe dilatation of bilateral atriums. Small pericardial effusion. Severe tricuspid valve regurgitation.  ? ?Pulmonary hypertension with severe RV failure.  ?04/20 cardiac catheterization with mean PA 42, PCWP 29, Cardiac output/ index 3.6 and 2,0 (Fick) and 3.1 and 1.7 (thermodilution).  ? ?Increased urine output to 1,450 ml over last 24 hrs.  ?Systolic blood pressure 694 to 130 mmHg. ? ?Plan to continue inotropic support with milrinone ?Continue diuresis with furosemide 80 mg IV q12 ?Spironolactone 12.5 mg daily.   ? ?Atrial fibrillation ([place]) ?Patient with RVR related atrial fibrillation, now placed on amiodarone infusion. ?Continue anticoagulation with apixaban.  ? ?Hypertension ?Continue close blood pressure monitoring, on milrinone for inotropic support and furosemide for diuresis.  ? ?Stage 4 chronic kidney disease ([place]) ?Hyponatremia, non anion gap metabolic acidosis.  ? ?Renal function with serum cr at 1,88, K is 4,9 and serum bicarbonate is 18.  ?Mg 2,2 ?Patient continue with hypervolemia, continue diuresis with furosemide.  ? ?Syncope ?Multifactorial, patient with RV failure and pulmonary hypertension. 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  ? ?Patient uses a walker for ambulation at home.  ? ?EEG with no seizures.  ? ?Cellulitis ?Significant improvement in rash bilateral legs. Likely skin changes due to peripheral vascular disease. ? ?Antibiotic therapy has been discontinued.  ?Cellulitis has been ruled out.  ? ?Hypothyroidism ?Continue with levothyroxine  ? ? ? ? ?  ? ?Subjective: Patient with reported improving dyspnea and edema, no chest pain, continue to have some confusion.  ? ?Physical Exam: ?Vitals:  ?  03/29/22 0600 03/29/22 0609 03/29/22 0652 03/29/22 0700  ?BP:   128/84   ?Pulse: 73  (!) 154 (!) 105  ?Resp: (!) 23  (!) 26 20  ?Temp:       ?TempSrc:      ?SpO2: 98%  98% 97%  ?Weight:  70.9 kg    ? ?Neurology awake and alert, non focal, answers simple questions and follows simple commands. Positive mild confusion but not agitation  ?ENT with positive pallor ?Cardiovascular with S1 and S2 present, irregularly irregular with no gallops, positive systolic murmur at the right sternal border, 3/6  ?Positive JVD ?Positive lower extremity edema up to the thighs +++ pitting, legs with wraps in place.  ?Abdomen not distended ?Data Reviewed: ? ? ? ?Family Communication: no family at the bedside  ? ?Disposition: ?Status is: Inpatient ?Remains inpatient appropriate because: heart failure  ? Planned Discharge Destination: Home ? ?Author: ?Tawni Millers, MD ?03/29/2022 3:08 PM ? ?For on call review [email or web address]  ?

## 2022-03-29 NOTE — Progress Notes (Addendum)
? ? Advanced Heart Failure Rounding Note ? ?PCP-Cardiologist: Sinclair Grooms, MD  ? ?Subjective:   ?[place] 4/20  ?RA = 14 (Marked v-waves) ?RV = 48/14 ?PA = 61/24 (42) ?PCW = 29 ?Fick cardiac output/index = 3.6/2.0 ?Thermo CO/CI = 3.1/1.7 ?PVR = 4.5 WU ?Ao sat = 98% ?PA sat = 50%,54% ?SVC sat = 58%  ?Assessment: ?1. Moderate mixed pulmonary HTN ?2. Severely elevated left-sided filling pressures ?3. Moderately to severely reduced CO ?4. Probably occluded distal RIJ ? ? ?Started on amio for A Fib RVR/PVCs. Post cath started on IV lasix + milrinone. Negative 1.4 liters .  ? ?Feeling better. Denies SOB. Says she cant get out of bed.  ? ? ? ?Objective:   ?Weight Range: ?70.9 kg ?Body mass index is 26.83 kg/m?.  ? ?Vital Signs:   ?Temp:  [97.5 ?F (36.4 ?C)-97.8 ?F (36.6 ?C)] 97.8 ?F (36.6 ?C) (04/21 0501) ?Pulse Rate:  [0-154] 105 (04/21 0700) ?Resp:  [16-30] 20 (04/21 0700) ?BP: (122-159)/(72-140) 128/84 (04/21 3474) ?SpO2:  [94 %-100 %] 97 % (04/21 0700) ?Weight:  [70.9 kg] 70.9 kg (04/21 0609) ?Last BM Date : 03/22/2022 ? ?Weight change: ?Filed Weights  ? 03/27/22 0418 03/16/2022 0445 03/29/22 0609  ?Weight: 72.1 kg 72.4 kg 70.9 kg  ? ? ?Intake/Output:  ? ?Intake/Output Summary (Last 24 hours) at 03/29/2022 1115 ?Last data filed at 03/29/2022 1051 ?Gross per 24 hour  ?Intake 3 ml  ?Output 2150 ml  ?Net -2147 ml  ?  ? ? ?Physical Exam  ?  ?General:   No resp difficulty ?HEENT: normal ?Neck: supple. JVP 10-11. Carotids 2+ bilat; no bruits. No lymphadenopathy or thryomegaly appreciated. ?Cor: PMI nondisplaced. Irregular rate & rhythm. No rubs, gallops or murmurs. ?Lungs: clear ?Abdomen: soft, nontender, nondistended. No hepatosplenomegaly. No bruits or masses. Good bowel sounds. ?Extremities: no cyanosis, clubbing, rash, R and LLE ace wraps.  ?Neuro: alert & orientedx3, cranial nerves grossly intact. moves all 4 extremities w/o difficulty. Affect pleasant ? ? ?A fib 110s with PVCs  ? ?EKG  ?  ?N/A ? ?Labs  ?  ?CBC ?Recent  Labs  ?  03/27/22 ?0244 03/22/2022 ?1507 03/22/2022 ?1513  ?WBC 5.0  --   --   ?HGB 12.2 13.9  13.6 12.9  ?HCT 37.1 41.0  40.0 38.0  ?MCV 100.8*  --   --   ?PLT 74*  --   --   ? ?Basic Metabolic Panel ?Recent Labs  ?  03/27/22 ?0244 03/31/2022 ?0906 03/12/2022 ?1507 04/05/2022 ?1513 03/29/22 ?0228  ?NA 134* 135   < > 140 132*  ?K 4.7 5.1   < > 4.4 4.9  ?CL 106 106  --   --  104  ?CO2 18* 18*  --   --  18*  ?GLUCOSE 117* 118*  --   --  156*  ?BUN 48* 47*  --   --  48*  ?CREATININE 1.78* 1.74*  --   --  1.88*  ?CALCIUM 8.7* 9.2  --   --  9.2  ?MG 2.0  --   --   --  2.2  ? < > = values in this interval not displayed.  ? ?Liver Function Tests ?Recent Labs  ?  03/27/22 ?0244  ?AST 36  ?ALT 21  ?ALKPHOS 142*  ?BILITOT 1.8*  ?PROT 6.7  ?ALBUMIN 2.8*  ? ?No results for input(s): LIPASE, AMYLASE in the last 72 hours. ?Cardiac Enzymes ?No results for input(s): CKTOTAL, CKMB, CKMBINDEX, TROPONINI in the last  72 hours. ? ?BNP: ?BNP (last 3 results) ?Recent Labs  ?  03/14/22 ?1502 03/15/2022 ?2126  ?BNP 1,424.0* 1,555.0*  ? ? ?ProBNP (last 3 results) ?No results for input(s): PROBNP in the last 8760 hours. ? ? ?D-Dimer ?No results for input(s): DDIMER in the last 72 hours. ?Hemoglobin A1C ?No results for input(s): HGBA1C in the last 72 hours. ?Fasting Lipid Panel ?No results for input(s): CHOL, HDL, LDLCALC, TRIG, CHOLHDL, LDLDIRECT in the last 72 hours. ?Thyroid Function Tests ?No results for input(s): TSH, T4TOTAL, T3FREE, THYROIDAB in the last 72 hours. ? ?Invalid input(s): FREET3 ? ?Other results: ? ? ?Imaging  ? ? ?CARDIAC CATHETERIZATION ? ?Result Date: 03/21/2022 ?Findings: RA = 14 (Marked v-waves) RV = 48/14 PA = 61/24 (42) PCW = 29 Fick cardiac output/index = 3.6/2.0 Thermo CO/CI = 3.1/1.7 PVR = 4.5 WU Ao sat = 98% PA sat = 50%,54% SVC sat = 58% Assessment: 1. Moderate mixed pulmonary HTN 2. Severely elevated left-sided filling pressures 3. Moderately to severely reduced CO 4. Probably occluded distal RIJ Plan/Discussion: She has  end-stage RV failure likely due to mixed pathology. With markedly elevated filling pressures and low output will start milrinone and IV lasix. It appears her distal RIJ is occluded. Glori Bickers, MD 4:52 PM  ? ? ?Medications:   ? ? ?Scheduled Medications: ? apixaban  2.5 mg Oral BID  ? furosemide  80 mg Intravenous BID  ? levothyroxine  25 mcg Oral Q0600  ? sodium chloride flush  3 mL Intravenous Q12H  ? spironolactone  12.5 mg Oral Daily  ? ? ?Infusions: ? sodium chloride 10 mL (03/25/22 0222)  ? sodium chloride    ? amiodarone 30 mg/hr (03/29/22 3532)  ? milrinone 0.125 mcg/kg/min (03/15/2022 1815)  ? ? ?PRN Medications: ?sodium chloride, sodium chloride, acetaminophen, ondansetron (ZOFRAN) IV, sodium chloride flush ? ? ? ?Patient Profile  ? ?  ?88 y/o woman with chronic AF, CKD IV and severe RV failure admitted with volume overload and recent syncope. Has been struggling with fluid management which has been c/b CKD.  ? ?Assessment/Plan  ? ?Syncope: ?-Felt to be likely d/t preload dependency in setting of RV failure and possible low-output ?-On atenolol 100 mg daily at home - held on admit (HR 50s).  ?-No significant pauses.  ?  ?2. HFpEF with RV failure: ?-Echo: EF 55-60%, ventricular septum flattened in systolic and diastole c/w RV pressure and volume overload, RV moderately reduced, RV severely enlarged, severe BAE, mild MR, severe TR, RVSP 45 mmHg ?-BNP 1,555 ?-? Low output. Lactic acid 1.3 on admit.  ?- RHC with elevated filling pressures. RA 14, PCWP  29, CO 3.6, CI 1.78 ?- Volume status improving. Continue IV lasix 80 mg twice a day.  ?-Continue spiro 12.5 mg daily. Continue current dose K 4.9  ?- Renal function stable.  ?  ?3. Severe Tricuspid Regurgitation ?-See echo above ?  ?4. Permanent atrial fibrillation: ?-Uncontrolled rate. WIll need to add amio drip for rate control and to suppress PVCs.  ?-continue Eliquis 2.5 mg BID (appropriate dose given age and renal function) ?  ?5. Hypothyroidism: ?-On  levothyroxine ?-TSH 5.8 ?  ?6. PVCs: ?-likely d/t dilated RV ?-Keep K > 4 and Mag > 2. High PVC burden with NSVT.  ?- Continue amio drip.   ? ?7. CKD Stage IIIb ?-Creatinine 2>1.7 > 1.9 ?-Follow BMET  ? ?Consult PT.  ?Length of Stay: 5 ? ?Darrick Grinder, NP  ?03/29/2022, 11:15 AM ? ?Advanced Heart Failure Team ?  Pager [phone] (M-F; [place])  ?Please contact [facility] for night-coverage after hours (5p -7a ) and weekends on [email or web address] ? ?Patient seen with NP, agree with the above note.  ? ?Weight down 3 lbs.  Creatinine stable 1.88.  She remains on milrinone 0.125.  Per patient, she has had vigorous UOP today.  ? ?General: NAD ?Neck: JVP 12-14 cm, no thyromegaly or thyroid nodule.  ?Lungs: Clear to auscultation bilaterally with normal respiratory effort. ?CV: Nondisplaced PMI.  Heart irregular S1/S2, no S3/S4, no murmur.  1+ edema to knees.  ?Abdomen: Soft, nontender, no hepatosplenomegaly, no distention.  ?Skin: Intact without lesions or rashes.  ?Neurologic: Alert and oriented x 3.  ?Psych: Normal affect. ?Extremities: No clubbing or cyanosis.  ?HEENT: Normal.  ? ?HFpEF with prominent RV failure.  She is on milrinone for RV support.  Still volume overloaded.  ?- Continue milrinone 0.125 ?- Continue Lasix 80 mg IV bid.  ? ?Permanent atrial fibrillation, continue Eliquis.  ? ?CKD stage 3, stable today with diuresis.  ? ?Loralie Champagne ?03/29/2022 ?2:30 PM ? ?

## 2022-03-29 NOTE — Progress Notes (Signed)
Staff RN called report patient with new onset of chest pain, vomiting, and elevated HR at 110-120s.  ? ?Patient examined at bedside, sitting in recliner, c/o right sided chest pain (under her right breast), feels like sharp pain. She states her SOB is overall improving, has been urinating a lot. She had some nausea and vomiting. She reports improving pain and nausea after receiving zofran and SL nitro. She denied hx of MI or CAD. She denied feeling heart racing.  ? ?Alert and oriented x3 ?Left IJ with dressing in place ?JVD to saw while sitting 90 degree  ?Heart sound irregularly irregular, no murmur ?Lung sound clear but diminished, on room air, speaks full sentence  ?Abdomen obese, soft ?BLE with ACE wraps ?Extremity warm to touch  ? ?EKG showed A fib VR 89 bpm ?Telemetry showed A fib 90-120s, frequent PVCs and NSVT ? ?Plan: ?Chest pain  ?-no CAD workup noted in the past, now with end stage RV failure, no ischemic evaluation indicated at this time  ?- suspect possible nausea /emesis associated chest discomfort, continue PRN zofran   ?- continue amiodarone gtt for A fib RVR ?- IV dilaudid was ordered per medicine servie, patient reports pain relief after one dose, may continue PRN, no further nitro indicated as less likely ischemic pain  ? ?Patient examined and strips reviewed with Dr Marlou Porch ? ?

## 2022-03-29 NOTE — Progress Notes (Addendum)
Pt called RN to room throwing up. HR sustaining 120-140. Zofran given. Nauseous occasionally throughout the day, first time telling the RN. Pt stated right anterior chest pain while throwing up. MD Arrien and NP Maylon Peppers notified. 1 SL nitro given at 1810, BP 144/101. At 1617 BP 150/85 CP 7/10, 2nd SL nitro given. CP 6/10, BP 142/107, still having emesis, 3rd nitro given at 1822. NP Maylon Peppers came bedside. At 1827 CP 6/10 after 3 SL nitro. IV dilaudid given. ?

## 2022-03-30 DIAGNOSIS — I5033 Acute on chronic diastolic (congestive) heart failure: Secondary | ICD-10-CM | POA: Diagnosis not present

## 2022-03-30 DIAGNOSIS — N184 Chronic kidney disease, stage 4 (severe): Secondary | ICD-10-CM | POA: Diagnosis not present

## 2022-03-30 DIAGNOSIS — R55 Syncope and collapse: Secondary | ICD-10-CM | POA: Diagnosis not present

## 2022-03-30 DIAGNOSIS — E039 Hypothyroidism, unspecified: Secondary | ICD-10-CM

## 2022-03-30 DIAGNOSIS — I4821 Permanent atrial fibrillation: Secondary | ICD-10-CM | POA: Diagnosis not present

## 2022-03-30 LAB — BASIC METABOLIC PANEL
Anion gap: 11 (ref 5–15)
BUN: 49 mg/dL — ABNORMAL HIGH (ref 8–23)
CO2: 22 mmol/L (ref 22–32)
Calcium: 9.2 mg/dL (ref 8.9–10.3)
Chloride: 102 mmol/L (ref 98–111)
Creatinine, Ser: 1.86 mg/dL — ABNORMAL HIGH (ref 0.44–1.00)
GFR, Estimated: 26 mL/min — ABNORMAL LOW (ref >60)
Glucose, Bld: 138 mg/dL — ABNORMAL HIGH (ref 70–99)
Potassium: 4.5 mmol/L (ref 3.5–5.1)
Sodium: 135 mmol/L (ref 135–145)

## 2022-03-30 MED ORDER — METOLAZONE 5 MG PO TABS
2.5000 mg | ORAL_TABLET | Freq: Once | ORAL | Status: AC
  Administered 2022-03-30: 2.5 mg via ORAL
  Filled 2022-03-30: qty 1

## 2022-03-30 MED ORDER — PANTOPRAZOLE SODIUM 40 MG PO TBEC
40.0000 mg | DELAYED_RELEASE_TABLET | Freq: Every day | ORAL | Status: DC
Start: 2022-03-30 — End: 2022-04-06
  Administered 2022-03-30 – 2022-04-05 (×7): 40 mg via ORAL
  Filled 2022-03-30 (×8): qty 1

## 2022-03-30 NOTE — Progress Notes (Signed)
?   03/30/22 2031  ?Assess: MEWS Score  ?Temp 98 ?F (36.7 ?C)  ?BP 130/83  ?Pulse Rate (!) 120  ?ECG Heart Rate (!) 120  ?Resp 18  ?SpO2 95 %  ?O2 Device Room Air  ?Assess: MEWS Score  ?MEWS Temp 0  ?MEWS Systolic 0  ?MEWS Pulse 2  ?MEWS RR 0  ?MEWS LOC 0  ?MEWS Score 2  ?MEWS Score Color Yellow  ?Assess: if the MEWS score is Yellow or Red  ?Were vital signs taken at a resting state? Yes  ?Focused Assessment No change from prior assessment  ?Early Detection of Sepsis Score *See Row Information* Low  ?MEWS guidelines implemented *See Row Information* No, previously yellow, continue vital signs every 4 hours  ? ? ?

## 2022-03-30 NOTE — Progress Notes (Signed)
Patient ID: Jasmine [name], female   DOB: Apr 29, 1934, 88 y.o.   MRN: [id number] ?  ? ? Advanced Heart Failure Rounding Note ? ?PCP-Cardiologist: Sinclair Grooms, MD  ? ?Subjective:   ? ?[place] 4/20  ?RA = 14 (Marked v-waves) ?RV = 48/14 ?PA = 61/24 (42) ?PCW = 29 ?Fick cardiac output/index = 3.6/2.0 ?Thermo CO/CI = 3.1/1.7 ?PVR = 4.5 WU ?Ao sat = 98% ?PA sat = 50%,54% ?SVC sat = 58%  ?Assessment: ?1. Moderate mixed pulmonary HTN ?2. Severely elevated left-sided filling pressures ?3. Moderately to severely reduced CO ?4. Probably occluded distal RIJ ? ? ?Started on amio for A Fib RVR/PVCs. Post cath started on IV lasix + milrinone. I/Os mildly negative yesterday, weight not changed. Creatinine stable 1.86.  ? ?Still feels "swollen," still some dyspnea with walking in room.  ? ?Objective:   ?Weight Range: ?71.6 kg ?Body mass index is 27.09 kg/m?.  ? ?Vital Signs:   ?Temp:  [97.6 ?F (36.4 ?C)-98.2 ?F (36.8 ?C)] 98.2 ?F (36.8 ?C) (04/22 0445) ?Pulse Rate:  [88-148] 148 (04/22 0850) ?Resp:  [16-24] 16 (04/22 0445) ?BP: (130-139)/(80-92) 137/80 (04/22 0445) ?SpO2:  [98 %] 98 % (04/22 0445) ?Weight:  [71.6 kg] 71.6 kg (04/22 0449) ?Last BM Date : 03/27/22 ? ?Weight change: ?Filed Weights  ? 03/15/2022 0445 03/29/22 0609 03/30/22 0449  ?Weight: 72.4 kg 70.9 kg 71.6 kg  ? ? ?Intake/Output:  ? ?Intake/Output Summary (Last 24 hours) at 03/30/2022 1003 ?Last data filed at 03/30/2022 0820 ?Gross per 24 hour  ?Intake 920.82 ml  ?Output 1550 ml  ?Net -629.18 ml  ?  ? ? ?Physical Exam  ?  ?General: NAD ?Neck: JVP 16 cm, no thyromegaly or thyroid nodule.  ?Lungs: Clear to auscultation bilaterally with normal respiratory effort. ?CV: Nondisplaced PMI.  Heart irregular S1/S2, no S3/S4, 2/6 HSM LLSB.  1+ ankle edema.  ?Abdomen: Soft, nontender, no hepatosplenomegaly, no distention.  ?Skin: Intact without lesions or rashes.  ?Neurologic: Alert and oriented x 3.  ?Psych: Normal affect. ?Extremities: No clubbing or cyanosis.  ?HEENT: Normal.   ? ? ? ?A fib 100s with PVCs (personally reviewed) ? ?EKG  ?  ?N/A ? ?Labs  ?  ?CBC ?Recent Labs  ?  03/25/2022 ?1507 03/22/2022 ?1513  ?HGB 13.9  13.6 12.9  ?HCT 41.0  40.0 38.0  ? ?Basic Metabolic Panel ?Recent Labs  ?  03/29/22 ?0228 03/29/22 ?1528 03/30/22 ?0251  ?NA 132*  --  135  ?K 4.9  --  4.5  ?CL 104  --  102  ?CO2 18*  --  22  ?GLUCOSE 156*  --  138*  ?BUN 48*  --  49*  ?CREATININE 1.88*  --  1.86*  ?CALCIUM 9.2  --  9.2  ?MG 2.2 2.1  --   ? ?Liver Function Tests ?No results for input(s): AST, ALT, ALKPHOS, BILITOT, PROT, ALBUMIN in the last 72 hours. ? ?No results for input(s): LIPASE, AMYLASE in the last 72 hours. ?Cardiac Enzymes ?No results for input(s): CKTOTAL, CKMB, CKMBINDEX, TROPONINI in the last 72 hours. ? ?BNP: ?BNP (last 3 results) ?Recent Labs  ?  03/14/22 ?1502 03/27/2022 ?2126  ?BNP 1,424.0* 1,555.0*  ? ? ?ProBNP (last 3 results) ?No results for input(s): PROBNP in the last 8760 hours. ? ? ?D-Dimer ?No results for input(s): DDIMER in the last 72 hours. ?Hemoglobin A1C ?No results for input(s): HGBA1C in the last 72 hours. ?Fasting Lipid Panel ?No results for input(s): CHOL, HDL, LDLCALC, TRIG,  CHOLHDL, LDLDIRECT in the last 72 hours. ?Thyroid Function Tests ?No results for input(s): TSH, T4TOTAL, T3FREE, THYROIDAB in the last 72 hours. ? ?Invalid input(s): FREET3 ? ?Other results: ? ? ?Imaging  ? ? ?No results found. ? ? ?Medications:   ? ? ?Scheduled Medications: ? apixaban  2.5 mg Oral BID  ? furosemide  80 mg Intravenous BID  ? levothyroxine  25 mcg Oral Q0600  ? metolazone  2.5 mg Oral Once  ? sodium chloride flush  3 mL Intravenous Q12H  ? spironolactone  12.5 mg Oral Daily  ? ? ?Infusions: ? sodium chloride 10 mL (03/25/22 0222)  ? sodium chloride    ? amiodarone 30 mg/hr (03/30/22 0732)  ? milrinone 0.125 mcg/kg/min (03/30/22 0981)  ? ? ?PRN Medications: ?sodium chloride, sodium chloride, acetaminophen, HYDROmorphone (DILAUDID) injection, nitroGLYCERIN, ondansetron (ZOFRAN) IV, sodium  chloride flush ? ? ? ?Patient Profile  ? ?  ?88 y/o woman with chronic AF, CKD IV and severe RV failure admitted with volume overload and recent syncope. Has been struggling with fluid management which has been c/b CKD.  ? ?Assessment/Plan  ? ?Syncope: ?-Felt to be likely d/t preload dependency in setting of RV failure and possible low-output ?-On atenolol 100 mg daily at home - held on admit (HR 50s).  ?-No significant pauses.  ?  ?2. HFpEF with RV failure: ?-Echo: EF 55-60%, ventricular septum flattened in systolic and diastole c/w RV pressure and volume overload, RV moderately reduced, RV severely enlarged, severe BAE, mild MR, severe TR, RVSP 45 mmHg ?-BNP 1,555 ?-? Low output. Lactic acid 1.3 on admit.  ?- RHC with elevated filling pressures. RA 14, PCWP  29, CO 3.6, CI 1.78 ?- She is on milrinone 0.125 for RV support, continue.  ?- Continue spiro 12.5 mg daily. Continue current dose K 4.9  ?- Renal function stable at 1.86 ?- Significant RV failure, not making much progress with diuresis.  Continue Lasix 80 mg IV bid and will add metolazone 2.5 x 1.  RV failure seems end stage.   ?  ?3. Severe Tricuspid Regurgitation ?-See echo above ?  ?4. Permanent atrial fibrillation: ?-Using amiodarone gtt for rate control while on milrinone.  ?-continue Eliquis 2.5 mg BID (appropriate dose given age and renal function) ?  ?5. Hypothyroidism: ?-On levothyroxine ?-TSH 5.8 ?  ?6. PVCs: ?-likely d/t dilated RV ?-Keep K > 4 and Mag > 2. High PVC burden with NSVT.  ?- Continue amio drip.   ? ?7. CKD Stage IIIb ?-Creatinine 2>1.7 > 1.9 > 1.86 ?-Follow BMET  ? ?Consult PT.  ?Length of Stay: 6 ? ?Loralie Champagne, MD  ?03/30/2022, 10:03 AM ? ?Advanced Heart Failure Team ?Pager [phone] (M-F; 7a - 5p)  ?Please contact [facility] for night-coverage after hours (5p -7a ) and weekends on [email or web address] ?

## 2022-03-30 NOTE — Evaluation (Signed)
Physical Therapy Evaluation ?Patient Details ?Name: Jasmine [name] ?MRN: [id number] ?DOB: Dec 11, 1933 ?Today's Date: 03/30/2022 ? ?History of Present Illness ? 88 yo admitted 4/15 after unconscious at home 15 min. Pt with acute on chronic HF with pulmonary HTN and severe RV failure. PMHx: CHF, AFib, HTN, DVT, vertigo, TIA, hypothyroidism  ?Clinical Impression ? PT with purewick leaking on arrival and pt assisted to bathroom and for pericare. Pt reports living with twin sister who does not use RW and continues to drive. Pt reports sister is only assist and not currently present so unsure if family can provide physical assist for balance deficits. Pt with balance, strength and functional deficits who will benefit from acute therapy to maximize mobility, safety and function.  ? ?HR 130-148 with gait, 93-110 at rest   ? ?   ? ?Recommendations for follow up therapy are one component of a multi-disciplinary discharge planning process, led by the attending physician.  Recommendations may be updated based on patient status, additional functional criteria and insurance authorization. ? ?Follow Up Recommendations Home health PT ? ?  ?Assistance Recommended at Discharge Frequent or constant Supervision/Assistance  ?Patient can return home with the following ? A little help with walking and/or transfers;A little help with bathing/dressing/bathroom;Assistance with cooking/housework;Assist for transportation;Direct supervision/assist for financial management;Direct supervision/assist for medications management ? ?  ?Equipment Recommendations BSC/3in1  ?Recommendations for Other Services ? OT consult  ?  ?Functional Status Assessment Patient has had a recent decline in their functional status and demonstrates the ability to make significant improvements in function in a reasonable and predictable amount of time.  ? ?  ?Precautions / Restrictions Precautions ?Precautions: Fall;Other (comment) ?Precaution Comments: watch HR,  incontinent  ? ?  ? ?Mobility ? Bed Mobility ?Overal bed mobility: Needs Assistance ?Bed Mobility: Supine to Sit ?  ?  ?Supine to sit: Min assist ?  ?  ?General bed mobility comments: min assist with HHA to elevate trunk with HOB 10 degrees, cues and increased time ?  ? ?Transfers ?Overall transfer level: Needs assistance ?  ?Transfers: Sit to/from Stand ?Sit to Stand: Min assist, Min guard ?  ?  ?  ?  ?  ?General transfer comment: minguard to rise from bed and min assist with rail to rise from low toilet ?  ? ?Ambulation/Gait ?Ambulation/Gait assistance: Min guard ?Gait Distance (Feet): 90 Feet ?Assistive device: Rolling walker (2 wheels) ?Gait Pattern/deviations: Step-through pattern, Decreased stride length, Trunk flexed ?  ?Gait velocity interpretation: 1.31 - 2.62 ft/sec, indicative of limited community ambulator ?  ?General Gait Details: pt with tendency to keep RW to her side and rotated with cues and assist to achieve correct position in RW, avoid obstacles and advance gait. Pt walked 10' then 104' with HR 130-148 with gait ? ?Stairs ?  ?  ?  ?  ?  ? ?Wheelchair Mobility ?  ? ?Modified Rankin (Stroke Patients Only) ?  ? ?  ? ?Balance Overall balance assessment: Needs assistance ?  ?Sitting balance-Leahy Scale: Fair ?  ?  ?Standing balance support: Bilateral upper extremity supported ?Standing balance-Leahy Scale: Poor ?Standing balance comment: bil UE on RW throughout standing and gait ?  ?  ?  ?  ?  ?  ?  ?  ?  ?  ?  ?   ? ? ? ?Pertinent Vitals/Pain Pain Assessment ?Pain Assessment: No/denies pain  ? ? ?Home Living Family/patient expects to be discharged to:: Private residence ?Living Arrangements: Other relatives (sister) ?Available Help at Discharge:  Family;Available 24 hours/day ?Type of Home: House ?Home Access: Stairs to enter ?  ?Entrance Stairs-Number of Steps: 1 ?  ?Home Layout: One level ?Home Equipment: Conservation officer, nature (2 wheels) ?   ?  ?Prior Function Prior Level of Function : Independent/Modified  Independent ?  ?  ?  ?  ?  ?  ?Mobility Comments: walks with RW ?ADLs Comments: not currently driving ?  ? ? ?Hand Dominance  ?   ? ?  ?Extremity/Trunk Assessment  ? Upper Extremity Assessment ?Upper Extremity Assessment: Generalized weakness ?  ? ?Lower Extremity Assessment ?Lower Extremity Assessment: Generalized weakness ?  ? ?Cervical / Trunk Assessment ?Cervical / Trunk Assessment: Normal  ?Communication  ? Communication: No difficulties  ?Cognition Arousal/Alertness: Awake/alert ?Behavior During Therapy: [facility] for tasks assessed/performed ?Overall Cognitive Status: Impaired/Different from baseline ?Area of Impairment: Memory, Safety/judgement ?  ?  ?  ?  ?  ?  ?  ?  ?  ?  ?Memory: Decreased short-term memory ?  ?Safety/Judgement: Decreased awareness of deficits, Decreased awareness of safety ?  ?  ?  ?  ?  ? ?  ?General Comments   ? ?  ?Exercises    ? ?Assessment/Plan  ?  ?PT Assessment Patient needs continued PT services  ?PT Problem List Decreased strength;Decreased mobility;Decreased activity tolerance;Decreased balance;Decreased knowledge of use of DME;Decreased cognition;Decreased safety awareness ? ?   ?  ?PT Treatment Interventions Gait training;Balance training;Functional mobility training;Therapeutic activities;Patient/family education;Therapeutic exercise;DME instruction;Cognitive remediation   ? ?PT Goals (Current goals can be found in the Care Plan section)  ?Acute Rehab PT Goals ?Patient Stated Goal: return home with my sister ?PT Goal Formulation: With patient ?Time For Goal Achievement: 04/13/22 ?Potential to Achieve Goals: Fair ? ?  ?Frequency Min 3X/week ?  ? ? ?Co-evaluation   ?  ?  ?  ?  ? ? ?  ?AM-PAC PT "6 Clicks" Mobility  ?Outcome Measure Help needed turning from your back to your side while in a flat bed without using bedrails?: A Little ?Help needed moving from lying on your back to sitting on the side of a flat bed without using bedrails?: A Little ?Help needed moving to and from a bed  to a chair (including a wheelchair)?: A Little ?Help needed standing up from a chair using your arms (e.g., wheelchair or bedside chair)?: A Little ?Help needed to walk in hospital room?: A Little ?Help needed climbing 3-5 steps with a railing? : A Lot ?6 Click Score: 17 ? ?  ?End of Session   ?Activity Tolerance: Patient tolerated treatment well ?Patient left: in chair;with call bell/phone within reach;with chair alarm set ?Nurse Communication: Mobility status ?PT Visit Diagnosis: Other abnormalities of gait and mobility (R26.89);Difficulty in walking, not elsewhere classified (R26.2);Muscle weakness (generalized) (M62.81);History of falling (Z91.81) ?  ? ?Time: 1884-1660 ?PT Time Calculation (min) (ACUTE ONLY): 35 min ? ? ?Charges:   PT Evaluation ?$PT Eval Moderate Complexity: 1 Mod ?PT Treatments ?$Gait Training: 8-22 mins ?  ?   ? ? ?Berenis Corter P, PT ?Acute Rehabilitation Services ?Pager: [phone] ?Office: [phone] ? ? ?Eames Dibiasio B Brecken Walth ?03/30/2022, 8:54 AM ? ?

## 2022-03-30 NOTE — Progress Notes (Signed)
?Progress Note ? ? ?Patient: Jasmine [name] GEZ:[id number] DOB: 1934/08/16 DOA: 03/27/2022     6 ?DOS: the patient was seen and examined on 03/30/2022 ?  ?Brief hospital course: ?Mrs. Sockwell was admitted to the hospital with the working diagnosis of decompensated heart failure, advanced RV failure and pulmonary hypertension.  ? ?88 yo female with the past medical history of chronic atrial fibrillation, diastolic heart failure, hypertension, chronic kidney disease stage 4, hypothyroidism, TIA, vertigo and DVT, who presented after a syncope episode. She was found on her recliner not responsive for 15 minutes, EMS was called, she slowly recovered her consciousness and she was transported to the ED. On her initial physical examination her blood pressure was 135/84, HR 38 to 58, RR 17 and 02 saturation 100%, lungs with no wheezing or rales, heart with S1 and S2 present and irregular, abdomen not distended, positive ++++ lower extremity edema with blisters.  ? ?Na 135, K 5,3 cl 110, bicarbonate 18, glucose 112 bun 52 cr 1,86 ?BNP 1,555 ?High sensitive troponin is 33 ?Wbc 4.2 hgb 13.1 plt  73  ?Sars covid 19 negative  ? ?Head CT negative for acute changes.  ? ?Chest radiograph with cardiomegaly, bilateral hilar vascular congestion. ? ?EKG 53 bpm, right axis, normal qtc, atrial fibrillation rhythm, with no significant ST segment or T wave changes.  ? ?Patient was placed on furosemide for diuresis. ?Echocardiogram with significant tricuspid regurgitation and moderate pulmonary hypertension.  ? ?Cardiac catheterization with low cardiac output 3.6 and index 2, (Fick), and thermodilution 3,1 and 1.7.  ?PCWP 29 and mean PA 42.  ?Patient was placed on inotropic support and further aggressive diuresis.  ?Amiodarone for atrial fibrillation.  ? ?04/ 22 patient responding to diuresis.  ? ?Assessment and Plan: ?* Acute on chronic diastolic CHF (congestive heart failure) ([place]) ?Echocardiogram with preserved LV systolic function EF 55  to 60%, ventricular septum flattened in systole and diastole consistent with RV pressure and volume overload. Moderate reduction in RV systolic dysfunction, RV cavity with severe enlargement, RVSP 44,8. Severe dilatation of bilateral atriums. Small pericardial effusion. Severe tricuspid valve regurgitation.  ? ?Pulmonary hypertension with severe RV failure.  ?04/20 cardiac catheterization with mean PA 42, PCWP 29, Cardiac output/ index 3.6 and 2,0 (Fick) and 3.1 and 1.7 (thermodilution).  ? ?Urine output to 1,200 ml over last 24 hrs.  ?Systolic blood pressure 650 to 118 mmHg. ? ?On inotropic support with milrinone ?Diuresis with furosemide 80 mg IV q12 ?04/22 metolazone 2,5 mg  ?Spironolactone 12.5 mg daily.   ? ?Out of bed to chair, continue to encourage mobility.  ? ?Atrial fibrillation ([place]) ?Persistent RVR related atrial fibrillation, ?Plan to continue with amiodarone infusion,.  ?Continue anticoagulation with apixaban.  ? ?Yesterday patient had worsening tachycardia related to chest pain and vomiting. ?Continue with as needed zofran and hydromorphone, add pantoprazole.  ? ?Hypertension ?Blood pressure support with milrinone  ?Continue close monitoring  ? ?Stage 4 chronic kidney disease ([place]) ?Hyponatremia, non anion gap metabolic acidosis.  ? ?Urine output over last 24 hrs is 1,200 ml. Patient continue to have edema ? ?Renal function with serum cr at 1,86 with K at 4,5 and serum bicarbonate at 22. ?Plan to continue diuresis with furosemide 80 mg IV q12 ?One dose of metolazone today.  ?Check electrolytes including Mg in am.  ? ?Syncope ?Multifactorial, patient with RV failure and pulmonary hypertension. 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  ? ?Patient uses a walker for ambulation at home.  ? ?EEG with no  seizures.  ? ?Cellulitis ?Significant improvement in rash bilateral legs. Likely skin changes due to peripheral vascular disease. ? ?Antibiotic therapy has been discontinued.  ?Cellulitis has been ruled out.   ? ?Hypothyroidism ?Continue with levothyroxine  ? ? ? ? ?  ? ?Subjective: Patient with improvement in dyspnea and edema but not back to baseline ? ?Physical Exam: ?Vitals:  ? 03/30/22 0445 03/30/22 0449 03/30/22 0850 03/30/22 1548  ?BP: 137/80   118/74  ?Pulse: 88  (!) 148 (!) 121  ?Resp: 16   18  ?Temp: 98.2 ?F (36.8 ?C)   97.9 ?F (36.6 ?C)  ?TempSrc: Oral   Oral  ?SpO2: 98%   99%  ?Weight:  71.6 kg    ? ?Neurology awake and alert ?ENT with mild pallor ?Cardiovascular with S1 and S2 present irregularly irregular, tachycardic with positive systolic murmur at the apex 3/6 ?Moderate JVD ?Positive lower extremity edema ++ pitting, wraps on legs bilaterally ?Respiratory with rales at bases but not wheezing or rhonchi ?Abdomen not distended  ?Data Reviewed: ? ? ? ?Family Communication: no family at the bedside  ? ?Disposition: ?Status is: Inpatient ?Remains inpatient appropriate because: heart failure  ? Planned Discharge Destination: Home ? ?Author: ?Tawni Millers, MD ?03/30/2022 5:43 PM ? ?For on call review [email or web address].  ?

## 2022-03-31 DIAGNOSIS — R55 Syncope and collapse: Secondary | ICD-10-CM | POA: Diagnosis not present

## 2022-03-31 DIAGNOSIS — I5033 Acute on chronic diastolic (congestive) heart failure: Secondary | ICD-10-CM | POA: Diagnosis not present

## 2022-03-31 DIAGNOSIS — I4821 Permanent atrial fibrillation: Secondary | ICD-10-CM | POA: Diagnosis not present

## 2022-03-31 DIAGNOSIS — I10 Essential (primary) hypertension: Secondary | ICD-10-CM | POA: Diagnosis not present

## 2022-03-31 LAB — BASIC METABOLIC PANEL
Anion gap: 14 (ref 5–15)
BUN: 60 mg/dL — ABNORMAL HIGH (ref 8–23)
CO2: 21 mmol/L — ABNORMAL LOW (ref 22–32)
Calcium: 9 mg/dL (ref 8.9–10.3)
Chloride: 96 mmol/L — ABNORMAL LOW (ref 98–111)
Creatinine, Ser: 2.16 mg/dL — ABNORMAL HIGH (ref 0.44–1.00)
GFR, Estimated: 22 mL/min — ABNORMAL LOW (ref >60)
Glucose, Bld: 185 mg/dL — ABNORMAL HIGH (ref 70–99)
Potassium: 4.7 mmol/L (ref 3.5–5.1)
Sodium: 131 mmol/L — ABNORMAL LOW (ref 135–145)

## 2022-03-31 LAB — MAGNESIUM: Magnesium: 2 mg/dL (ref 1.7–2.4)

## 2022-03-31 NOTE — Progress Notes (Signed)
Patient ID: Jasmine [name], female   DOB: Oct 31, 1934, 88 y.o.   MRN: [id number] ?  ? ? Advanced Heart Failure Rounding Note ? ?PCP-Cardiologist: Sinclair Grooms, MD  ? ?Subjective:   ? ?[place] 4/20  ?RA = 14 (Marked v-waves) ?RV = 48/14 ?PA = 61/24 (42) ?PCW = 29 ?Fick cardiac output/index = 3.6/2.0 ?Thermo CO/CI = 3.1/1.7 ?PVR = 4.5 WU ?Ao sat = 98% ?PA sat = 50%,54% ?SVC sat = 58%  ?Assessment: ?1. Moderate mixed pulmonary HTN ?2. Severely elevated left-sided filling pressures ?3. Moderately to severely reduced CO ?4. Probably occluded distal RIJ ? ? ?Started on amio for A Fib RVR/PVCs. Post cath started on IV lasix + milrinone. I/Os not well recorded but weight unchanged.  Creatinine higher 1.86 => 2.16.   ? ?Says she feels better, more interactive today.  ? ?Objective:   ?Weight Range: ?71.6 kg ?Body mass index is 27.09 kg/m?.  ? ?Vital Signs:   ?Temp:  [97.9 ?F (36.6 ?C)-98 ?F (36.7 ?C)] 97.9 ?F (36.6 ?C) (04/23 0420) ?Pulse Rate:  [65-121] 65 (04/23 0420) ?Resp:  [18-20] 19 (04/23 0420) ?BP: (118-146)/(74-92) 142/80 (04/23 0420) ?SpO2:  [92 %-99 %] 92 % (04/23 0420) ?Last BM Date : 03/27/22 ? ?Weight change: ?Filed Weights  ? 03/16/2022 0445 03/29/22 0609 03/30/22 0449  ?Weight: 72.4 kg 70.9 kg 71.6 kg  ? ? ?Intake/Output:  ? ?Intake/Output Summary (Last 24 hours) at 03/31/2022 0918 ?Last data filed at 03/30/2022 1630 ?Gross per 24 hour  ?Intake 420 ml  ?Output --  ?Net 420 ml  ?  ? ? ?Physical Exam  ?  ?General: NAD ?Neck: JVP 16 cm, no thyromegaly or thyroid nodule.  ?Lungs: Clear to auscultation bilaterally with normal respiratory effort. ?CV: Nondisplaced PMI.  Heart irregular S1/S2, no S3/S4, no murmur.  1+ ankle edema.   ?Abdomen: Soft, nontender, no hepatosplenomegaly, no distention.  ?Skin: Intact without lesions or rashes.  ?Neurologic: Alert and oriented x 3.  ?Psych: Normal affect. ?Extremities: No clubbing or cyanosis.  ?HEENT: Normal.  ? ? ?A fib 90s-100s with PVCs (personally reviewed) ? ?EKG  ?   ?N/A ? ?Labs  ?  ?CBC ?Recent Labs  ?  03/24/2022 ?1507 03/22/2022 ?1513  ?HGB 13.9  13.6 12.9  ?HCT 41.0  40.0 38.0  ? ?Basic Metabolic Panel ?Recent Labs  ?  03/29/22 ?1528 03/30/22 ?0251 03/31/22 ?0225  ?NA  --  135 131*  ?K  --  4.5 4.7  ?CL  --  102 96*  ?CO2  --  22 21*  ?GLUCOSE  --  138* 185*  ?BUN  --  49* 60*  ?CREATININE  --  1.86* 2.16*  ?CALCIUM  --  9.2 9.0  ?MG 2.1  --  2.0  ? ?Liver Function Tests ?No results for input(s): AST, ALT, ALKPHOS, BILITOT, PROT, ALBUMIN in the last 72 hours. ? ?No results for input(s): LIPASE, AMYLASE in the last 72 hours. ?Cardiac Enzymes ?No results for input(s): CKTOTAL, CKMB, CKMBINDEX, TROPONINI in the last 72 hours. ? ?BNP: ?BNP (last 3 results) ?Recent Labs  ?  03/14/22 ?1502 03/22/2022 ?2126  ?BNP 1,424.0* 1,555.0*  ? ? ?ProBNP (last 3 results) ?No results for input(s): PROBNP in the last 8760 hours. ? ? ?D-Dimer ?No results for input(s): DDIMER in the last 72 hours. ?Hemoglobin A1C ?No results for input(s): HGBA1C in the last 72 hours. ?Fasting Lipid Panel ?No results for input(s): CHOL, HDL, LDLCALC, TRIG, CHOLHDL, LDLDIRECT in the last 72 hours. ?Thyroid  Function Tests ?No results for input(s): TSH, T4TOTAL, T3FREE, THYROIDAB in the last 72 hours. ? ?Invalid input(s): FREET3 ? ?Other results: ? ? ?Imaging  ? ? ?No results found. ? ? ?Medications:   ? ? ?Scheduled Medications: ? apixaban  2.5 mg Oral BID  ? levothyroxine  25 mcg Oral Q0600  ? pantoprazole  40 mg Oral Daily  ? sodium chloride flush  3 mL Intravenous Q12H  ? ? ?Infusions: ? sodium chloride 10 mL (03/25/22 0222)  ? sodium chloride    ? amiodarone 30 mg/hr (03/31/22 0834)  ? milrinone 0.125 mcg/kg/min (03/31/22 1610)  ? ? ?PRN Medications: ?sodium chloride, sodium chloride, acetaminophen, HYDROmorphone (DILAUDID) injection, nitroGLYCERIN, ondansetron (ZOFRAN) IV, sodium chloride flush ? ? ? ?Patient Profile  ? ?  ?88 y/o woman with chronic AF, CKD IV and severe RV failure admitted with volume overload  and recent syncope. Has been struggling with fluid management which has been c/b CKD.  ? ?Assessment/Plan  ? ?Syncope: ?-Felt to be likely d/t preload dependency in setting of RV failure and possible low-output ?-On atenolol 100 mg daily at home - held on admit (HR 50s).  ?-No significant pauses.  ?  ?2. HFpEF with RV failure: ?-Echo: EF 55-60%, ventricular septum flattened in systolic and diastole c/w RV pressure and volume overload, RV moderately reduced, RV severely enlarged, severe BAE, mild MR, severe TR, RVSP 45 mmHg ?-BNP 1,555 ?-? Low output. Lactic acid 1.3 on admit.  ?- RHC with elevated filling pressures. RA 14, PCWP  29, CO 3.6, CI 1.78 ?- She is on milrinone 0.125 for RV support, continue today.  ?- Stop spironolactone with elevated creatinine.  ?- Renal function worse 1.86 => 2.16.  Got Lasix and metolazone yesterday, hold today.  JVP elevated but has severe TR.   ?- Significant RV failure, may be as good as we can get her at this point.  More comfortable.  Exam difficult with TR.  Would start po diuretic and stop milrinone when creatinine stabilizes.  ?  ?3. Severe Tricuspid Regurgitation ?  ?4. Permanent atrial fibrillation: ?-Using amiodarone gtt for rate control while on milrinone.  ?-continue Eliquis 2.5 mg BID (appropriate dose given age and renal function) ?  ?5. Hypothyroidism: ?-On levothyroxine ?-TSH 5.8 ?  ?6. PVCs: ?-likely d/t dilated RV ?-Keep K > 4 and Mag > 2. High PVC burden with NSVT.  ?- Continue amio drip.   ? ?7. CKD Stage IIIb ?-Creatinine 2>1.7 > 1.9 > 1.86 > 2.16 ?-Holding diuretics today.  ? ?Consult PT.  ? ?Length of Stay: 7 ? ?Loralie Champagne, MD  ?03/31/2022, 9:18 AM ? ?Advanced Heart Failure Team ?Pager [phone] (M-F; 7a - 5p)  ?Please contact [facility] for night-coverage after hours (5p -7a ) and weekends on [email or web address] ?

## 2022-03-31 NOTE — Progress Notes (Signed)
?Progress Note ? ? ?Patient: Jasmine [name] UXN:[id number] DOB: 09/06/1934 DOA: 03/31/2022     7 ?DOS: the patient was seen and examined on 03/31/2022 ?  ?Brief hospital course: ?Jasmine [name] was admitted to the hospital with the working diagnosis of decompensated heart failure, advanced RV failure and pulmonary hypertension.  ? ?88 yo female with the past medical history of chronic atrial fibrillation, diastolic heart failure, hypertension, chronic kidney disease stage 4, hypothyroidism, TIA, vertigo and DVT, who presented after a syncope episode. She was found on her recliner not responsive for 15 minutes, EMS was called, she slowly recovered her consciousness and she was transported to the ED. On her initial physical examination her blood pressure was 135/84, HR 38 to 58, RR 17 and 02 saturation 100%, lungs with no wheezing or rales, heart with S1 and S2 present and irregular, abdomen not distended, positive ++++ lower extremity edema with blisters.  ? ?Na 135, K 5,3 cl 110, bicarbonate 18, glucose 112 bun 52 cr 1,86 ?BNP 1,555 ?High sensitive troponin is 33 ?Wbc 4.2 hgb 13.1 plt  73  ?Sars covid 19 negative  ? ?Head CT negative for acute changes.  ? ?Chest radiograph with cardiomegaly, bilateral hilar vascular congestion. ? ?EKG 53 bpm, right axis, normal qtc, atrial fibrillation rhythm, with no significant ST segment or T wave changes.  ? ?Patient was placed on furosemide for diuresis. ?Echocardiogram with significant tricuspid regurgitation and moderate pulmonary hypertension.  ? ?Cardiac catheterization with low cardiac output 3.6 and index 2, (Fick), and thermodilution 3,1 and 1.7.  ?PCWP 29 and mean PA 42.  ?Patient was placed on inotropic support and further aggressive diuresis.  ?Amiodarone for atrial fibrillation.  ? ?04/ 22 patient responding to diuresis.  ? ?Assessment and Plan: ?* Acute on chronic diastolic CHF (congestive heart failure) ([place]) ?Echocardiogram with preserved LV systolic function EF 55  to 60%, ventricular septum flattened in systole and diastole consistent with RV pressure and volume overload. Moderate reduction in RV systolic dysfunction, RV cavity with severe enlargement, RVSP 44,8. Severe dilatation of bilateral atriums. Small pericardial effusion. Severe tricuspid valve regurgitation.  ? ?Pulmonary hypertension with severe RV failure.  ?04/20 cardiac catheterization with mean PA 42, PCWP 29, Cardiac output/ index 3.6 and 2,0 (Fick) and 3.1 and 1.7 (thermodilution).  ? ?Urine output documented 350 cc over last 24 hrs  ?Systolic blood pressure 254 to 148 mmHg.  ? ?On inotropic support with milrinone ?Improved volume status. ?Holding furosemide and spironolactone in the setting of worsening renal function, possible intravascular volume depleted.  ?04/22 metolazone 2,5 mg  ? ?Out of bed to chair, continue to encourage mobility.  ? ?Atrial fibrillation ([place]) ?Persistent RVR related atrial fibrillation, ?Plan to continue with amiodarone infusion,.  ?Continue anticoagulation with apixaban.  ? ?Continue atrial fibrillation with PVC on telemetry monitoring.  ? ?Hypertension ?Blood pressure support with milrinone  ?Continue close monitoring  ?Holding diuresis due to suspected intravascular volume depleted.  ? ?Stage 4 chronic kidney disease ([place]) ?Hyponatremia, non anion gap metabolic acidosis.  ? ?04/22 metolazone .  ? ?Today with worsening renal function with serum cr at 2,16 with K at 4,7 and serum bicarbonate at 21. Mg 2,0  ? ?Continue holding diuretic therapy for now and follow up renal function in am.  ?Avoid hypotension and nephrotoxic medications.  ? ?Syncope ?Multifactorial, patient with RV failure and pulmonary hypertension. 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  ? ?Patient uses a walker for ambulation at home.  ? ?EEG with no seizures.  ? ?  Cellulitis ?Significant improvement in rash bilateral legs. Likely skin changes due to peripheral vascular disease. ? ?Antibiotic therapy has been discontinued.   ?Cellulitis has been ruled out.  ? ?Hypothyroidism ?Continue with levothyroxine  ? ? ? ? ?  ? ?Subjective: Patient with improvement in edema, no dyspnea at rest ? ? ?Physical Exam: ?Vitals:  ? 03/31/22 1008 03/31/22 1100 03/31/22 1204 03/31/22 1343  ?BP: (!) 142/81 (!) 143/85 (!) 143/85 (!) 148/105  ?Pulse:  90  100  ?Resp: (!) 22 (!) 23 (!) 24 (!) 21  ?Temp: 97.9 ?F (36.6 ?C) 97.8 ?F (36.6 ?C)  98 ?F (36.7 ?C)  ?TempSrc: Oral Oral  Oral  ?SpO2:  98%  90%  ?Weight:      ? ?Neurology awake and alert ?ENT with mild pallor ?Cardiovascular with S1 and S2 present irregularly irregular with no gallops or rubs, positive systolic murmur 3/6 at the apex. ?Positive JVD ?Positive lower extremity edema + pitting ?Respiratory with mild rales at bases bilaterally. No wheezing ?Abdomen not distended  ?Data Reviewed: ? ? ? ?Family Communication: no family at the bedside  ? ?Disposition: ?Status is: Inpatient ?Remains inpatient appropriate because: heart failure  ? Planned Discharge Destination: Home ? ? ? ? ?Author: ?Tawni Millers, MD ?03/31/2022 2:30 PM ? ?For on call review [email or web address]  ?

## 2022-03-31 NOTE — Plan of Care (Signed)
?  Problem: Clinical Measurements: ?Goal: Respiratory complications will improve ?Outcome: Progressing ?  ?Problem: Activity: ?Goal: Risk for activity intolerance will decrease ?Outcome: Progressing ?  ?Problem: Elimination: ?Goal: Will not experience complications related to urinary retention ?Outcome: Progressing ?  ?

## 2022-03-31 NOTE — Evaluation (Addendum)
Occupational Therapy Evaluation ?Patient Details ?Name: Jasmine [name] ?MRN: [id number] ?DOB: 1934/10/21 ?Today's Date: 03/31/2022 ? ? ?History of Present Illness 88 yo admitted 4/15 after unconscious at home 15 min. Pt with acute on chronic HF with pulmonary HTN and severe RV failure. PMHx: CHF, AFib, HTN, DVT, vertigo, TIA, hypothyroidism  ? ?Clinical Impression ?  ?Pt reports independence at baseline with ADLs, uses RW for mobility. Pt lives with sister who can assist at d/c. Pt a & o x4, currently requiring min A for ADLs, min A for bed mobility, and min A for transfers. Needing multiple attempts to stand from bed surface, and increased time and cuing with transfers. Pt tachycardic with HR 120's-150's with chair transfer, RN notified. Pt presenting with impairments listed below, will follow acutely. Recommend HHOT at d/c. ?   ? ?Recommendations for follow up therapy are one component of a multi-disciplinary discharge planning process, led by the attending physician.  Recommendations may be updated based on patient status, additional functional criteria and insurance authorization.  ? ?Follow Up Recommendations ? Home health OT  ?  ?Assistance Recommended at Discharge Set up Supervision/Assistance  ?Patient can return home with the following A little help with walking and/or transfers;A little help with bathing/dressing/bathroom;Assistance with cooking/housework;Assist for transportation;Help with stairs or ramp for entrance ? ?  ?Functional Status Assessment ? Patient has had a recent decline in their functional status and demonstrates the ability to make significant improvements in function in a reasonable and predictable amount of time.  ?Equipment Recommendations ? Tub/shower seat  ?  ?Recommendations for Other Services   ? ? ?  ?Precautions / Restrictions Precautions ?Precautions: Fall;Other (comment) ?Precaution Comments: watch HR, incontinent ?Restrictions ?Weight Bearing Restrictions: No  ? ?  ? ?Mobility  Bed Mobility ?Overal bed mobility: Needs Assistance ?Bed Mobility: Supine to Sit ?  ?  ?Supine to sit: Min assist ?  ?  ?General bed mobility comments: increased time, mod cues needed to scoot forward toward [facility] ?  ? ?Transfers ?Overall transfer level: Needs assistance ?Equipment used: Rolling walker (2 wheels) ?Transfers: Sit to/from Stand ?Sit to Stand: Min assist ?  ?  ?  ?  ?  ?General transfer comment: min A; required x2 attempts to stand from EOB ?  ? ?  ?Balance Overall balance assessment: Needs assistance ?Sitting-balance support: Feet supported ?Sitting balance-Leahy Scale: Fair ?Sitting balance - Comments: cannot reach outside BOS without LOB ?  ?Standing balance support: Bilateral upper extremity supported ?Standing balance-Leahy Scale: Poor ?Standing balance comment: reliant on RW support ?  ?  ?  ?  ?  ?  ?  ?  ?  ?  ?  ?   ? ?ADL either performed or assessed with clinical judgement  ? ?ADL Overall ADL's : Needs assistance/impaired ?Eating/Feeding: Set up;Sitting ?  ?Grooming: Set up;Sitting;Standing ?  ?Upper Body Bathing: Minimal assistance;Sitting ?  ?Lower Body Bathing: Minimal assistance;Sitting/lateral leans ?  ?Upper Body Dressing : Minimal assistance;Sitting ?  ?Lower Body Dressing: Minimal assistance;Sitting/lateral leans;Sit to/from stand ?  ?Toilet Transfer: Minimal assistance;Rolling walker (2 wheels);Ambulation;Regular Toilet ?Toilet Transfer Details (indicate cue type and reason): simulated to chair ?Toileting- Clothing Manipulation and Hygiene: Minimal assistance;Sit to/from stand;Sitting/lateral lean ?  ?  ?  ?Functional mobility during ADLs: Minimal assistance;Rolling walker (2 wheels) ?   ? ? ? ?Vision   ?Vision Assessment?: No apparent visual deficits  ?   ?Perception   ?  ?Praxis   ?  ? ?Pertinent Vitals/Pain Pain Assessment ?Pain  Assessment: No/denies pain  ? ? ? ?Hand Dominance Right ?  ?Extremity/Trunk Assessment Upper Extremity Assessment ?Upper Extremity Assessment: Generalized  weakness ?  ?Lower Extremity Assessment ?Lower Extremity Assessment: Defer to PT evaluation ?  ?Cervical / Trunk Assessment ?Cervical / Trunk Assessment: Normal ?  ?Communication Communication ?Communication: No difficulties (soft spoken) ?  ?Cognition Arousal/Alertness: Awake/alert ?Behavior During Therapy: [facility] for tasks assessed/performed ?Overall Cognitive Status: Impaired/Different from baseline ?Area of Impairment: Memory, Safety/judgement ?  ?  ?  ?  ?  ?  ?  ?  ?  ?  ?Memory: Decreased short-term memory ?  ?Safety/Judgement: Decreased awareness of deficits, Decreased awareness of safety ?  ?  ?  ?  ?  ?General Comments  tachycardic HR in 120-150's during transfer to chair ? ?  ?Exercises   ?  ?Shoulder Instructions    ? ? ?Home Living Family/patient expects to be discharged to:: Private residence ?Living Arrangements: Other relatives (sister) ?Available Help at Discharge: Family;Available 24 hours/day ?Type of Home: House ?Home Access: Stairs to enter ?Entrance Stairs-Number of Steps: 1 ?Entrance Stairs-Rails: Right ?Home Layout: One level ?  ?  ?Bathroom Shower/Tub: Tub/shower unit ?  ?Bathroom Toilet: Standard ?  ?  ?Home Equipment: Conservation officer, nature (2 wheels) ?  ?  ?  ? ?  ?Prior Functioning/Environment Prior Level of Function : Independent/Modified Independent ?  ?  ?  ?  ?  ?  ?Mobility Comments: walks with RW ?ADLs Comments: does not drive, does IADLs ?  ? ?  ?  ?OT Problem List: Decreased strength;Decreased range of motion;Decreased activity tolerance;Impaired balance (sitting and/or standing);Decreased safety awareness;Decreased knowledge of use of DME or AE;Cardiopulmonary status limiting activity ?  ?   ?OT Treatment/Interventions: Self-care/ADL training;Therapeutic exercise;DME and/or AE instruction;Therapeutic activities;Patient/family education;Balance training  ?  ?OT Goals(Current goals can be found in the care plan section) Acute Rehab OT Goals ?Patient Stated Goal: none stated ?OT Goal  Formulation: With patient ?Time For Goal Achievement: 04/14/22 ?Potential to Achieve Goals: Good ?ADL Goals ?Pt Will Perform Upper Body Dressing: with modified independence;sitting ?Pt Will Perform Lower Body Dressing: with supervision;sit to/from stand;sitting/lateral leans ?Pt Will Transfer to Toilet: with supervision;ambulating;regular height toilet ?Pt Will Perform Tub/Shower Transfer: with supervision;Shower transfer;Tub transfer;ambulating;shower seat;rolling walker  ?OT Frequency: Min 3X/week ?  ? ?Co-evaluation   ?  ?  ?  ?  ? ?  ?AM-PAC OT "6 Clicks" Daily Activity     ?Outcome Measure Help from another person eating meals?: None ?Help from another person taking care of personal grooming?: A Little ?Help from another person toileting, which includes using toliet, bedpan, or urinal?: A Little ?Help from another person bathing (including washing, rinsing, drying)?: A Little ?Help from another person to put on and taking off regular upper body clothing?: A Little ?Help from another person to put on and taking off regular lower body clothing?: A Lot ?6 Click Score: 18 ?  ?End of Session Equipment Utilized During Treatment: Gait belt;Rolling walker (2 wheels) ?Nurse Communication: Mobility status ? ?Activity Tolerance: Patient tolerated treatment well ?Patient left: in chair;with call bell/phone within reach;with chair alarm set ? ?OT Visit Diagnosis: Unsteadiness on feet (R26.81);Other abnormalities of gait and mobility (R26.89);Muscle weakness (generalized) (M62.81)  ?              ?Time: 4742-5956 ?OT Time Calculation (min): 23 min ?Charges:  OT General Charges ?$OT Visit: 1 Visit ?OT Evaluation ?$OT Eval Moderate Complexity: 1 Mod ?OT Treatments ?$Self Care/Home Management : 8-22  mins ? ?Lynnda Child, OTD, OTR/L ?Acute Rehab ?[phone] ? ?Kaylyn Lim ?03/31/2022, 9:52 AM ?

## 2022-03-31 NOTE — Progress Notes (Signed)
Dressing changed to bilateral lower ext. as ordered. Erupted blister to left lower ext. open area beefy red tissue noted that is dry with no drainage. Scab on right lower ext. Pt tolerated well. ?

## 2022-03-31 NOTE — Plan of Care (Signed)

## 2022-04-01 DIAGNOSIS — I5033 Acute on chronic diastolic (congestive) heart failure: Secondary | ICD-10-CM | POA: Diagnosis not present

## 2022-04-01 DIAGNOSIS — R55 Syncope and collapse: Secondary | ICD-10-CM | POA: Diagnosis not present

## 2022-04-01 DIAGNOSIS — N184 Chronic kidney disease, stage 4 (severe): Secondary | ICD-10-CM | POA: Diagnosis not present

## 2022-04-01 DIAGNOSIS — I4821 Permanent atrial fibrillation: Secondary | ICD-10-CM | POA: Diagnosis not present

## 2022-04-01 LAB — BASIC METABOLIC PANEL
Anion gap: 12 (ref 5–15)
Anion gap: 15 (ref 5–15)
BUN: 65 mg/dL — ABNORMAL HIGH (ref 8–23)
BUN: 63 mg/dL — ABNORMAL HIGH (ref 8–23)
CO2: 23 mmol/L (ref 22–32)
CO2: 21 mmol/L — ABNORMAL LOW (ref 22–32)
Calcium: 8.6 mg/dL — ABNORMAL LOW (ref 8.9–10.3)
Calcium: 8.5 mg/dL — ABNORMAL LOW (ref 8.9–10.3)
Chloride: 96 mmol/L — ABNORMAL LOW (ref 98–111)
Chloride: 93 mmol/L — ABNORMAL LOW (ref 98–111)
Creatinine, Ser: 2.53 mg/dL — ABNORMAL HIGH (ref 0.44–1.00)
Creatinine, Ser: 2.55 mg/dL — ABNORMAL HIGH (ref 0.44–1.00)
GFR, Estimated: 18 mL/min — ABNORMAL LOW (ref >60)
GFR, Estimated: 18 mL/min — ABNORMAL LOW (ref >60)
Glucose, Bld: 126 mg/dL — ABNORMAL HIGH (ref 70–99)
Glucose, Bld: 168 mg/dL — ABNORMAL HIGH (ref 70–99)
Potassium: 4.6 mmol/L (ref 3.5–5.1)
Potassium: 4.6 mmol/L (ref 3.5–5.1)
Sodium: 131 mmol/L — ABNORMAL LOW (ref 135–145)
Sodium: 129 mmol/L — ABNORMAL LOW (ref 135–145)

## 2022-04-01 LAB — MAGNESIUM
Magnesium: 1.9 mg/dL (ref 1.7–2.4)
Magnesium: 1.9 mg/dL (ref 1.7–2.4)

## 2022-04-01 MED ORDER — FUROSEMIDE 10 MG/ML IJ SOLN
120.0000 mg | Freq: Three times a day (TID) | INTRAVENOUS | Status: DC
  Administered 2022-04-01 – 2022-04-02 (×3): 120 mg via INTRAVENOUS
  Filled 2022-04-01 (×3): qty 12
  Filled 2022-04-01: qty 10
  Filled 2022-04-01: qty 12

## 2022-04-01 MED ORDER — POTASSIUM CHLORIDE CRYS ER 20 MEQ PO TBCR
20.0000 meq | EXTENDED_RELEASE_TABLET | Freq: Once | ORAL | Status: AC
  Administered 2022-04-01: 20 meq via ORAL
  Filled 2022-04-01: qty 1

## 2022-04-01 MED ORDER — MAGNESIUM SULFATE 2 GM/50ML IV SOLN
2.0000 g | Freq: Once | INTRAVENOUS | Status: AC
Start: 2022-04-02 — End: 2022-04-02
  Administered 2022-04-01: 2 g via INTRAVENOUS
  Filled 2022-04-01: qty 50

## 2022-04-01 NOTE — Progress Notes (Signed)
?  Mobility Specialist Criteria Algorithm Info. ? ? 04/01/22 1445  ?Mobility  ?Activity Contraindicated/medical hold  ? ?Patient HR elevated 130+ hold per request of RN. ? ?04/01/2022 ?3:42 PM ? ?[place] Toshiye Kever, CMS, BS EXP ?Acute Rehabilitation Services  ?ONGEX:[phone] ?Office: [phone] ? ?

## 2022-04-01 NOTE — Plan of Care (Signed)

## 2022-04-01 NOTE — Progress Notes (Signed)
Pt's niece Tekela Garguilo [phone] would like case manager to update about pt going to hospice, informed niece that I would forward message to case manager. ?

## 2022-04-01 NOTE — Progress Notes (Signed)
?Progress Note ? ? ?Patient: Jasmine [name] QQP:[id number] DOB: 03-18-1934 DOA: 03/26/2022     8 ?DOS: the patient was seen and examined on 04/01/2022 ?  ?Brief hospital course: ?Jasmine [name] was admitted to the hospital with the working diagnosis of decompensated heart failure, advanced RV failure and pulmonary hypertension.  ? ?88 yo female with the past medical history of chronic atrial fibrillation, diastolic heart failure, hypertension, chronic kidney disease stage 4, hypothyroidism, TIA, vertigo and DVT, who presented after a syncope episode. She was found on her recliner not responsive for 15 minutes, EMS was called, she slowly recovered her consciousness and she was transported to the ED. On her initial physical examination her blood pressure was 135/84, HR 38 to 58, RR 17 and 02 saturation 100%, lungs with no wheezing or rales, heart with S1 and S2 present and irregular, abdomen not distended, positive ++++ lower extremity edema with blisters.  ? ?Na 135, K 5,3 cl 110, bicarbonate 18, glucose 112 bun 52 cr 1,86 ?BNP 1,555 ?High sensitive troponin is 33 ?Wbc 4.2 hgb 13.1 plt  73  ?Sars covid 19 negative  ? ?Head CT negative for acute changes.  ? ?Chest radiograph with cardiomegaly, bilateral hilar vascular congestion. ? ?EKG 53 bpm, right axis, normal qtc, atrial fibrillation rhythm, with no significant ST segment or T wave changes.  ? ?Patient was placed on furosemide for diuresis. ?Echocardiogram with significant tricuspid regurgitation and moderate pulmonary hypertension.  ? ?Cardiac catheterization with low cardiac output 3.6 and index 2, (Fick), and thermodilution 3,1 and 1.7.  ?PCWP 29 and mean PA 42.  ?Patient was placed on inotropic support and further aggressive diuresis.  ?Amiodarone for atrial fibrillation.  ? ?04/ 22 patient responding to diuresis.  ? ?Assessment and Plan: ?* Acute on chronic diastolic CHF (congestive heart failure) ([place]) ?Echocardiogram with preserved LV systolic function EF 55  to 60%, ventricular septum flattened in systole and diastole consistent with RV pressure and volume overload. Moderate reduction in RV systolic dysfunction, RV cavity with severe enlargement, RVSP 44,8. Severe dilatation of bilateral atriums. Small pericardial effusion. Severe tricuspid valve regurgitation.  ? ?Pulmonary hypertension with severe RV failure.  ?04/20 cardiac catheterization with mean PA 42, PCWP 29, Cardiac output/ index 3.6 and 2,0 (Fick) and 3.1 and 1.7 (thermodilution).  ? ?Urine output documented 1,300cc over last 24 hrs  ?Systolic blood pressure 712 to 113 mmHg.  ? ?Continue with  inotropic support with milrinone ? ?04/22 metolazone 2.5 mg ?04/24 resume furosemide 120 mg q 8 hrs.   ?Holding  spironolactone in the setting of worsening renal function.  ?Out of bed to chair, continue to encourage mobility.  ? ?Positive edema, despite aggressive medical therapy, likely patient in a end stage heart failure condition.  ? ? ?Atrial fibrillation (B and E) ?Persistent RVR related atrial fibrillation, ?Heart rate in the 100's continue with amiodarone infusion, and anticoagulation with apixaban.  ? ?Positive PVC on the telemetry monitor.  ? ?Hypertension ?Blood pressure support with milrinone  ?Continue close monitoring  ?Holding diuresis due to suspected intravascular volume depleted.  ? ?Stage 4 chronic kidney disease ([place]) ?Hyponatremia, non anion gap metabolic acidosis.  ? ?04/22 metolazone .  ? ?Patient with worsening renal function with serum cr at 2,45 with K at 4,6 and serum bicarbonate at 23.  ? ?Continue on milrinone for inotropic support. ?Resume diuretic therapy with furosemide 120 mg IV q 8 hrs.  ? ?Syncope ?Multifactorial, patient with RV failure and pulmonary hypertension. 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  ? ?  Patient uses a walker for ambulation at home.  ? ?EEG with no seizures.  ? ?Cellulitis ?Significant improvement in rash bilateral legs. Likely skin changes due to peripheral vascular  disease. ? ?Antibiotic therapy has been discontinued.  ?Cellulitis has been ruled out.  ? ?Hypothyroidism ?Continue with levothyroxine  ? ? ? ? ?  ? ?Subjective: Patient very weak and deconditioned, continue to have edema no chest pain, no nausea or vomiting  ? ?Physical Exam: ?Vitals:  ? 04/01/22 0416 04/01/22 0801 04/01/22 1000 04/01/22 1210  ?BP: 108/66 123/64  113/63  ?Pulse: (!) 105 (!) 120 (!) 134 (!) 102  ?Resp: 20 19 (!) 23 20  ?Temp: 98.2 ?F (36.8 ?C) 98.2 ?F (36.8 ?C)  98.2 ?F (36.8 ?C)  ?TempSrc: Oral Oral  Oral  ?SpO2: 96% 96% 96% 96%  ?Weight: 68.7 kg     ? ?Neurology awake and alert ?ENT with mild pallor ?Cardiovascular with S1 and S2 present irregularly irregular with no gallops or rubs ?Positive systolic murmur at the right sternal border. 3/6 .  ?Respiratory with no rales or wheezing ?Abdomen not distended  ?Data Reviewed: ? ? ? ?Family Communication: no family at the bedside. I called her sister but not able to reach her, I left a message.  ? ?Disposition: ?Status is: Inpatient ?Remains inpatient appropriate because: heart failure  ? Planned Discharge Destination: Home ? ?Author: ?Jasmine Millers, MD ?04/01/2022 2:54 PM ? ?For on call review [email or web address].  ?

## 2022-04-01 NOTE — Progress Notes (Addendum)
Patient ID: Jasmine [name], female   DOB: [birth year], 88 y.o.   MRN: [id number] ?  ? ? Advanced Heart Failure Rounding Note ? ?PCP-Cardiologist: Jasmine Grooms, MD  ? ?Subjective:   ? ?[name] 4/20  ?RA = 14 (Marked v-waves) ?RV = 48/14 ?PA = 61/24 (42) ?PCW = 29 ?Fick cardiac output/index = 3.6/2.0 ?Thermo CO/CI = 3.1/1.7 ?PVR = 4.5 WU ?Ao sat = 98% ?PA sat = 50%,54% ?SVC sat = 58%  ?Assessment: ?1. Moderate mixed pulmonary HTN ?2. Severely elevated left-sided filling pressures ?3. Moderately to severely reduced CO ?4. Probably occluded distal RIJ ? ? ?Started on amio for A Fib RVR/PVCs. Post cath started on IV lasix + milrinone. ? ?Diuretics held yesterday d/t worsening renal function. Scr 1.86>2.16>2.53 ? ?Is/Os not reliable. Weight down 6 lb from 04/22. ? ?BP stable. ? ?Reports episode of urinary incontinence overnight. Denies dyspnea at rest. Legs still feel swollen. ? ? ?Objective:   ?Weight Range: ?68.7 kg ?Body mass index is 26 kg/m?.  ? ?Vital Signs:   ?Temp:  [97.8 ?F (36.6 ?C)-98.2 ?F (36.8 ?C)] 98.2 ?F (36.8 ?C) (04/24 0801) ?Pulse Rate:  [90-120] 120 (04/24 0801) ?Resp:  [17-24] 19 (04/24 0801) ?BP: (108-150)/(64-105) 123/64 (04/24 0801) ?SpO2:  [90 %-98 %] 96 % (04/24 0801) ?Weight:  [68.7 kg] 68.7 kg (04/24 0416) ?Last BM Date : 03/31/22 ? ?Weight change: ?Filed Weights  ? 03/29/22 0609 03/30/22 0449 04/01/22 0416  ?Weight: 70.9 kg 71.6 kg 68.7 kg  ? ? ?Intake/Output:  ? ?Intake/Output Summary (Last 24 hours) at 04/01/2022 0836 ?Last data filed at 04/01/2022 0559 ?Gross per 24 hour  ?Intake 333.29 ml  ?Output 1300 ml  ?Net -966.71 ml  ?  ? ? ?Physical Exam  ?  ?General:  Fatigued appearing. No distress. ?HEENT: normal ?Neck: supple. JVP to jaw. Carotids 2+ bilat; no bruits.  ?Cor: PMI nondisplaced. Irregular rhythm. No rubs, gallops, 2/6 TR murmur ?Lungs: coarse anteriorly ?Abdomen: soft, nontender, + distended. No hepatosplenomegaly.  ?Extremities: no cyanosis, clubbing, rash, 2+ edema, + UNNA ?Neuro:  alert & orientedx3, cranial nerves grossly intact. moves all 4 extremities w/o difficulty. Affect pleasant ? ? ? ?TELEMETRY  ?  ?Afib 100s, 15-20 PVCs/min, short runs NSVT up to 4-5 beats in duration ? ?Labs  ?  ?CBC ?No results for input(s): WBC, NEUTROABS, HGB, HCT, MCV, PLT in the last 72 hours. ? ?Basic Metabolic Panel ?Recent Labs  ?  03/29/22 ?1528 03/30/22 ?0251 03/31/22 ?0225 04/01/22 ?0432  ?NA  --    < > 131* 131*  ?K  --    < > 4.7 4.6  ?CL  --    < > 96* 96*  ?CO2  --    < > 21* 23  ?GLUCOSE  --    < > 185* 126*  ?BUN  --    < > 60* 65*  ?CREATININE  --    < > 2.16* 2.53*  ?CALCIUM  --    < > 9.0 8.6*  ?MG 2.1  --  2.0  --   ? < > = values in this interval not displayed.  ? ?Liver Function Tests ?No results for input(s): AST, ALT, ALKPHOS, BILITOT, PROT, ALBUMIN in the last 72 hours. ? ?No results for input(s): LIPASE, AMYLASE in the last 72 hours. ?Cardiac Enzymes ?No results for input(s): CKTOTAL, CKMB, CKMBINDEX, TROPONINI in the last 72 hours. ? ?BNP: ?BNP (last 3 results) ?Recent Labs  ?  03/14/22 ?1502 04/02/2022 ?2126  ?  BNP 1,424.0* 1,555.0*  ? ? ?ProBNP (last 3 results) ?No results for input(s): PROBNP in the last 8760 hours. ? ? ?D-Dimer ?No results for input(s): DDIMER in the last 72 hours. ?Hemoglobin A1C ?No results for input(s): HGBA1C in the last 72 hours. ?Fasting Lipid Panel ?No results for input(s): CHOL, HDL, LDLCALC, TRIG, CHOLHDL, LDLDIRECT in the last 72 hours. ?Thyroid Function Tests ?No results for input(s): TSH, T4TOTAL, T3FREE, THYROIDAB in the last 72 hours. ? ?Invalid input(s): FREET3 ? ?Other results: ? ? ?Imaging  ? ? ?No results found. ? ? ?Medications:   ? ? ?Scheduled Medications: ? apixaban  2.5 mg Oral BID  ? levothyroxine  25 mcg Oral Q0600  ? pantoprazole  40 mg Oral Daily  ? sodium chloride flush  3 mL Intravenous Q12H  ? ? ?Infusions: ? sodium chloride 10 mL (03/25/22 0222)  ? sodium chloride    ? amiodarone 30 mg/hr (04/01/22 0555)  ? milrinone 0.125 mcg/kg/min  (04/01/22 0554)  ? ? ?PRN Medications: ?sodium chloride, sodium chloride, acetaminophen, HYDROmorphone (DILAUDID) injection, nitroGLYCERIN, ondansetron (ZOFRAN) IV, sodium chloride flush ? ? ? ?Patient Profile  ? ?  ?88 y/o woman with chronic AF, CKD IV and severe RV failure admitted with volume overload and recent syncope. Has been struggling with fluid management which has been c/b CKD.  ? ?Assessment/Plan  ? ?Syncope: ?-Felt to be likely d/t preload dependency in setting of RV failure and possible low-output ?-On atenolol 100 mg daily at home - held on admit (HR 50s).  ?-No significant pauses.  ?  ?2. HFpEF with RV failure: ?-Echo: EF 55-60%, ventricular septum flattened in systolic and diastole c/w RV pressure and volume overload, RV moderately reduced, RV severely enlarged, severe BAE, mild MR, severe TR, RVSP 45 mmHg ?-BNP 1,555 ?-? Low output. Lactic acid 1.3 on admit.  ?- RHC with elevated filling pressures. RA 14, PCWP  29, CO 3.6, CI 1.78 ?- She is on milrinone 0.125 for RV support ?- Off spironolactone with elevated creatinine.  ?- Renal function worse 1.86 => 2.16 => 2.53.  Got Lasix and metolazone 04/22, continue to hold today.  Weight down but still volume overloaded. ?- Increase milrinone to 0.25. Restart IV lasix at 120 mg TID.  ?  ?3. Severe Tricuspid Regurgitation: ?- Noted on echo this admit ?  ?4. Permanent atrial fibrillation: ?-Using amiodarone gtt for rate control while on milrinone.  ?-continue Eliquis 2.5 mg BID (appropriate dose given age and renal function) ?  ?5. Hypothyroidism: ?-On levothyroxine ?-TSH 5.8 ?  ?6. PVCs/NSVT: ?-likely d/t dilated RV ?-Keep K > 4 and Mag > 2. High PVC burden with NSVT.  ?- Continue amio gtt at 30/hr while on milrinone.   ? ?7. CKD Stage IIIb ?-Creatinine 2>1.7 > 1.9 > 1.86 > 2.16>2.5 ?-Restart diuretics ?-Increase milrinone as above ?-Would not be a candidate for HD ? ? ?Deconditioned. HH PT/OT recommended at discharge, may be too weak to return home.  Continue to mobilize. ? ?Worry about trajectory and overall prognosis. Would benefit from Palliative Care consult. May need to consider hospice at discharge if not improving with increased inotrope support. She wants her twin sister present for further discussions. ? ? ?Length of Stay: 8 ? ?FINCH, LINDSAY N, PA-C  ?04/01/2022, 8:36 AM ? ?Advanced Heart Failure Team ?Pager [phone] (M-F; 7a - 5p)  ?Please contact [facility] for night-coverage after hours (5p -7a ) and weekends on [email or web address] ? ?Patient seen and examined with the above-signed Advanced Practice  Provider and/or Housestaff. I personally reviewed laboratory data, imaging studies and relevant notes. I independently examined the patient and formulated the important aspects of the plan. I have edited the note to reflect any of my changes or salient points. I have personally discussed the plan with the patient and/or family. ? ?She feels weak and bloated. Mild SOB. No orthopnea or PND. SCr getting worse. AF rate increased despite IV amio. ? ?General:  Elderly. Weak appearing. No resp difficulty ?HEENT: normal ?Neck: supple. JVP to ear  Bilateral neck bruising Carotids 2+ bilat; no bruits. No lymphadenopathy or thryomegaly appreciated. ?Cor: Irregular tachy 2/6 TR ?Lungs: clear ?Abdomen: soft, nontender, + distended. No hepatosplenomegaly. No bruits or masses. Good bowel sounds. ?Extremities: no cyanosis, clubbing, rash, 3+ edema into thighs ?Neuro: alert & orientedx3, cranial nerves grossly intact. moves all 4 extremities w/o difficulty. Affect pleasant ? ?She has end-stage diastolic HF with severe RV involvement. Remains markedly fluid overloaded and now with worsening renal function. Agree with increasing milrinone and IV lasix. May need lasix gtt. She is not candidate for HD. ? ?I am worried that she would be at very high risk for returning home. We discussed Hospice and Palliative Care but she is concerned that they well "try to sell her something" and  wants her twin sister present for the discussion.  ? ?I suspect Hospice Care is currently the only option for her.  ? ?Glori Bickers, MD  ?2:36 PM ? ?

## 2022-04-01 NOTE — Progress Notes (Signed)
TRH night cross cover note: ? ?I was notified by RN that she had been contacted by telemetry RN who conveyed a 7 beat run of nonsustained V. Tach.  RN confirms that the patient was asymptomatic with this episode, and specifically denies any associated chest pain or shortness of breath.  Otherwise, reported vital signs stable. ? ?To further assess, I ordered BMP and serum magnesium levels to be checked this evening.  Ensuing BMP results notable for the following: Sodium 129, which corrects to nearly 130 when taking into account concomitant glucose level of 168.  this serum sodium level appears consistent with corresponding sodium levels on each of the last 2 mornings, which were noted to be 131 on both occasions.  Additionally, this updated BMP shows potassium 4.6, while serum magnesium level found to be 1.9.  Consequently, in the setting of the above 7 beat run of nonsustained V. tach, will attempt to optimize her magnesium level by ordering 2 g of IV magnesium sulfate over 2 hours x 1 dose now.  Continue to monitor on telemetry. ? ? ? ?Babs Bertin, DO ?Hospitalist ? ?

## 2022-04-01 NOTE — Progress Notes (Signed)
PT Cancellation Note ? ?Patient Details ?Name: DEBBRA DIGIULIO ?MRN: [id number] ?DOB: 1934-04-15 ? ? ?Cancelled Treatment:    Reason Eval/Treat Not Completed: (P) Medical issues which prohibited therapy (RN defer, pt HR tachy to 130's bpm resting despite amiodarone given.) Will continue efforts per PT plan of care as schedule permits. ? ? ?Kara Pacer Melvin Whiteford ?04/01/2022, 5:13 PM ? ? ?

## 2022-04-01 NOTE — Care Management Important Message (Signed)
Important Message ? ?Patient Details  ?Name: Jasmine [name] ?MRN: [id number] ?Date of Birth: 1934-04-28 ? ? ?Medicare Important Message Given:  Yes ? ? ? ? ?Shelda Altes ?04/01/2022, 9:12 AM ?

## 2022-04-02 DIAGNOSIS — Z515 Encounter for palliative care: Secondary | ICD-10-CM | POA: Diagnosis not present

## 2022-04-02 DIAGNOSIS — N184 Chronic kidney disease, stage 4 (severe): Secondary | ICD-10-CM | POA: Diagnosis not present

## 2022-04-02 DIAGNOSIS — I4821 Permanent atrial fibrillation: Secondary | ICD-10-CM | POA: Diagnosis not present

## 2022-04-02 DIAGNOSIS — Z7189 Other specified counseling: Secondary | ICD-10-CM

## 2022-04-02 DIAGNOSIS — I5033 Acute on chronic diastolic (congestive) heart failure: Secondary | ICD-10-CM | POA: Diagnosis not present

## 2022-04-02 DIAGNOSIS — R55 Syncope and collapse: Secondary | ICD-10-CM | POA: Diagnosis not present

## 2022-04-02 LAB — BASIC METABOLIC PANEL
Anion gap: 13 (ref 5–15)
BUN: 60 mg/dL — ABNORMAL HIGH (ref 8–23)
CO2: 22 mmol/L (ref 22–32)
Calcium: 8.6 mg/dL — ABNORMAL LOW (ref 8.9–10.3)
Chloride: 91 mmol/L — ABNORMAL LOW (ref 98–111)
Creatinine, Ser: 2.36 mg/dL — ABNORMAL HIGH (ref 0.44–1.00)
GFR, Estimated: 20 mL/min — ABNORMAL LOW (ref >60)
Glucose, Bld: 149 mg/dL — ABNORMAL HIGH (ref 70–99)
Potassium: 4.4 mmol/L (ref 3.5–5.1)
Sodium: 126 mmol/L — ABNORMAL LOW (ref 135–145)

## 2022-04-02 LAB — MAGNESIUM: Magnesium: 2.3 mg/dL (ref 1.7–2.4)

## 2022-04-02 MED ORDER — TORSEMIDE 20 MG PO TABS
60.0000 mg | ORAL_TABLET | Freq: Every day | ORAL | Status: DC
  Administered 2022-04-03 – 2022-04-05 (×3): 60 mg via ORAL
  Filled 2022-04-02 (×3): qty 3

## 2022-04-02 MED ORDER — MEXILETINE HCL 150 MG PO CAPS
150.0000 mg | ORAL_CAPSULE | Freq: Two times a day (BID) | ORAL | Status: DC
  Administered 2022-04-02 – 2022-04-05 (×6): 150 mg via ORAL
  Filled 2022-04-02 (×10): qty 1

## 2022-04-02 MED ORDER — MILRINONE LACTATE IN DEXTROSE 20-5 MG/100ML-% IV SOLN
0.1250 ug/kg/min | INTRAVENOUS | Status: DC
Start: 2022-04-02 — End: 2022-04-04
  Administered 2022-04-03: 0.125 ug/kg/min via INTRAVENOUS
  Filled 2022-04-02: qty 100

## 2022-04-02 MED ORDER — AMIODARONE HCL 200 MG PO TABS
200.0000 mg | ORAL_TABLET | Freq: Two times a day (BID) | ORAL | Status: DC
  Administered 2022-04-02 – 2022-04-03 (×3): 200 mg via ORAL
  Filled 2022-04-02 (×3): qty 1

## 2022-04-02 NOTE — Progress Notes (Signed)
Physical Therapy Treatment ?Patient Details ?Name: Jasmine [name] ?MRN: [id number] ?DOB: 1934-03-03 ?Today's Date: 04/02/2022 ? ? ?History of Present Illness 88 yo admitted 4/15 after unconscious at home 15 min. Pt with acute on chronic HF with pulmonary HTN and severe RV failure. PMHx: CHF, AFib, HTN, DVT, vertigo, TIA, hypothyroidism ? ?  ?PT Comments  ? ? Pt able to progress gait distance this session with HR highly fluctuant from 102-135 with activity and Afib. Pt continues to require min assist with all transfers and gait and if family can provide appropriate assist return home is recommended. Will continue to follow and encouraged increased activity with nursing staff acutely.  ? ?HR 110 at rest   ?Recommendations for follow up therapy are one component of a multi-disciplinary discharge planning process, led by the attending physician.  Recommendations may be updated based on patient status, additional functional criteria and insurance authorization. ? ?Follow Up Recommendations ? Home health PT ?  ?  ?Assistance Recommended at Discharge Frequent or constant Supervision/Assistance  ?Patient can return home with the following A little help with walking and/or transfers;A little help with bathing/dressing/bathroom;Assistance with cooking/housework;Assist for transportation;Direct supervision/assist for financial management;Direct supervision/assist for medications management ?  ?Equipment Recommendations ? BSC/3in1  ?  ?Recommendations for Other Services   ? ? ?  ?Precautions / Restrictions Precautions ?Precautions: Fall;Other (comment) ?Precaution Comments: watch HR, incontinent  ?  ? ?Mobility ? Bed Mobility ?Overal bed mobility: Needs Assistance ?Bed Mobility: Supine to Sit ?  ?  ?Supine to sit: Min assist ?  ?  ?General bed mobility comments: HOB 33 degrees with increased time and min assist to lift trunk from surface ?  ? ?Transfers ?Overall transfer level: Needs assistance ?  ?Transfers: Sit to/from  Stand ?Sit to Stand: Min assist ?  ?  ?  ?  ?  ?General transfer comment: min assist with cues for hand placement to rise from bed and to chair ?  ? ?Ambulation/Gait ?Ambulation/Gait assistance: Min assist ?Gait Distance (Feet): 160 Feet ?Assistive device: Rolling walker (2 wheels) ?Gait Pattern/deviations: Step-through pattern, Decreased stride length, Trunk flexed, Drifts right/left ?Gait velocity: 54ft/60 sec=.3 ?Gait velocity interpretation: <1.31 ft/sec, indicative of household ambulator ?  ?General Gait Details: pt with continued drift right with min assist to correct, cues for proximity to RW with pt able to self regulate increased distance and recall room number but could not locate it ? ? ?Stairs ?  ?  ?  ?  ?  ? ? ?Wheelchair Mobility ?  ? ?Modified Rankin (Stroke Patients Only) ?  ? ? ?  ?Balance Overall balance assessment: Needs assistance ?  ?Sitting balance-Leahy Scale: Fair ?Sitting balance - Comments: static sitting without support ?  ?Standing balance support: Bilateral upper extremity supported ?Standing balance-Leahy Scale: Poor ?Standing balance comment: reliant on RW support ?  ?  ?  ?  ?  ?  ?  ?  ?  ?  ?  ?  ? ?  ?Cognition Arousal/Alertness: Awake/alert ?Behavior During Therapy: [facility] for tasks assessed/performed ?Overall Cognitive Status: Impaired/Different from baseline ?Area of Impairment: Memory, Safety/judgement ?  ?  ?  ?  ?  ?  ?  ?  ?  ?  ?Memory: Decreased short-term memory ?  ?Safety/Judgement: Decreased awareness of deficits, Decreased awareness of safety ?  ?  ?General Comments: pt continues to state "nothing wrong with my mind" despite no questions or statements to indicate need for this statement, decreased awareness of safety and slow  processing. a bit tangential ?  ?  ? ?  ?Exercises General Exercises - Lower Extremity ?Long Arc Quad: AROM, Both, Seated, 20 reps ?Hip Flexion/Marching: AROM, Both, 15 reps, Seated ? ?  ?General Comments   ?  ?  ? ?Pertinent Vitals/Pain Pain  Assessment ?Pain Assessment: No/denies pain  ? ? ?Home Living   ?  ?  ?  ?  ?  ?  ?  ?  ?  ?   ?  ?Prior Function    ?  ?  ?   ? ?PT Goals (current goals can now be found in the care plan section) Progress towards PT goals: Progressing toward goals ? ?  ?Frequency ? ? ? Min 3X/week ? ? ? ?  ?PT Plan Current plan remains appropriate  ? ? ?Co-evaluation   ?  ?  ?  ?  ? ?  ?AM-PAC PT "6 Clicks" Mobility   ?Outcome Measure ? Help needed turning from your back to your side while in a flat bed without using bedrails?: A Little ?Help needed moving from lying on your back to sitting on the side of a flat bed without using bedrails?: A Little ?Help needed moving to and from a bed to a chair (including a wheelchair)?: A Little ?Help needed standing up from a chair using your arms (e.g., wheelchair or bedside chair)?: A Little ?Help needed to walk in hospital room?: A Little ?Help needed climbing 3-5 steps with a railing? : A Lot ?6 Click Score: 17 ? ?  ?End of Session   ?Activity Tolerance: Patient tolerated treatment well ?Patient left: in chair;with call bell/phone within reach;with chair alarm set ?Nurse Communication: Mobility status ?PT Visit Diagnosis: Other abnormalities of gait and mobility (R26.89);Difficulty in walking, not elsewhere classified (R26.2);Muscle weakness (generalized) (M62.81);History of falling (Z91.81) ?  ? ? ?Time: [phone] ?PT Time Calculation (min) (ACUTE ONLY): 34 min ? ?Charges:  $Gait Training: 8-22 mins ?$Therapeutic Exercise: 8-22 mins          ?          ? ?Jasmine [name] P, PT ?Acute Rehabilitation Services ?Pager: [phone] ?Office: [phone] ? ? ? ?Jasmine [name] B Jasmine [name] ?04/02/2022, 10:05 AM ? ?

## 2022-04-02 NOTE — Plan of Care (Signed)

## 2022-04-02 NOTE — Progress Notes (Signed)
I spoke with patient's nice about patient's condition and prognosis. I explained that Mrs. Baldridge has terminal heart failure. She and the family agree with palliative care at discharge and then possibly transfer to hospice if needed.  ?The family will discuss CODE status and let the team know as soon as they have a decision.  ? ? ?

## 2022-04-02 NOTE — TOC Initial Note (Addendum)
Transition of Care (TOC) - Initial/Assessment Note  ? ? ?Patient Details  ?Name: Jasmine [name] ?MRN: [id number] ?Date of Birth: [birth year] ? ?Transition of Care [facility]) CM/SW Contact:    ?Marcheta Grammes Rexene Alberts, RN ?Phone Number: [phone] ?04/02/2022, 11:33 AM ? ?Clinical Narrative:                 ? ?HF TOC CM spoke to pt at bedside and gave permission to speak sister, [name] and niece, Doroteo Bradford # [phone]. Pt lives in home with twin sister. Pt has RW at home. Niece is requesting 3n1 bedside commode, recommended by PT. Pt is active with [facility]. Contacted [place], Malachy Mood to make aware HF [place] orders are in Epic with possible dc home tomorrow. Discussed Outpatient Palliative Services with niece and she is agreeable. Contacted [facility] and their outpt Palliative services are limited. Contacted Manufacturing engineer with referral for Outpatient Palliative. Spoke to rep, Fabio Pierce states she will follow up with pt and niece. Niece is the contact. [place] for 3n1 bedside commode to be delivered to room.  ? ? ? ? ?Expected Discharge Plan: [place] ?Barriers to Discharge: Continued Medical Work up ? ? ?Patient Goals and CMS Choice ?Patient states their goals for this hospitalization and ongoing recovery are:: wants to return home ?CMS Medicare.gov Compare Post Acute Care list provided to:: Patient ?  ? ?Expected Discharge Plan and Services ?Expected Discharge Plan: [place] ?  ?Discharge Planning Services: CM Consult ?Post Acute Care Choice: Home Health ?Living arrangements for the past 2 months: [place] ?                ?DME Arranged: 3-N-1 ?DME Agency: [employer] ?Date DME Agency Contacted: 04/02/22 ?Time DME Agency Contacted: 1100 ?  ?HH Arranged: RN, PT, Nurse's Aide ?[place] Agency: [place] ?Date HH Agency Contacted: 04/02/22 ?Time [place]: 1101 ?  ? ?Prior Living Arrangements/Services ?Living  arrangements for the past 2 months: [place] ?Lives with:: Siblings ?Patient language and need for interpreter reviewed:: Yes ?Do you feel safe going back to the place where you live?: Yes      ?  ?  ?Current home services: DME Librarian, academic) ?Criminal Activity/Legal Involvement Pertinent to Current Situation/Hospitalization: No - Comment as needed ? ?Activities of Daily Living ?  ?  ? ?Permission Sought/Granted ?Permission sought to share information with : Case Manager, Family Supports, PCP ?Permission granted to share information with : Yes, Verbal Permission Granted ?  ? ?Emotional Assessment ? ? ?Admission diagnosis:  Syncope [R55] ?Chronic diastolic HF (heart failure) ([place]) [I50.32] ?Long term current use of anticoagulant therapy [Z79.01] ?Syncope, unspecified syncope type [R55] ?Stage 4 chronic kidney disease ([place]) [N18.4] ?Patient Active Problem List  ? Diagnosis Date Noted  ? Hypothyroidism 03/27/2022  ? Cellulitis 03/24/2022  ? Syncope 03/31/2022  ? Stage 4 chronic kidney disease ([place]) 03/21/2022  ? Deviated septum 01/11/2020  ? Epistaxis 01/11/2020  ? CKD (chronic kidney disease) stage 3, GFR 30-59 ml/min (HCC) 04/19/2019  ? TIA (transient ischemic attack) 10/02/2018  ? Long term current use of anticoagulant therapy 02/08/2014  ? Acute on chronic diastolic CHF (congestive heart failure) ([place]) 11/21/2012  ? Insulin resistance 11/21/2012  ? Deep venous thrombosis of lower extremity ([place]) 06/02/2012  ? Hypertension 01/07/2012  ? Atrial fibrillation ([place]) 12/23/2011  ? ?PCP:  [name], MD ?Pharmacy:   ?[facility] [phone] -  [place], [place] - [address] ?[place] [address] ?Phone: [phone] Fax: [phone] ? ? ? ? ?Social Determinants of Health (SDOH) Interventions ?  ? ?Readmission Risk Interventions ?   ? View : No data to display.  ?  ?  ?  ? ? ? ?

## 2022-04-02 NOTE — Progress Notes (Signed)
?  Mobility Specialist Criteria Algorithm Info. ? ? ? 04/02/22 1700  ?Mobility  ?Activity Ambulated with assistance in hallway ?(in chair before and after ambulation)  ?Range of Motion/Exercises Active;All extremities  ?Level of Assistance Contact guard assist, steadying assist ?(min A sit>stand)  ?Assistive Device Front wheel walker  ?Distance Ambulated (ft) 220 ft  ?Activity Response Tolerated well  ? ?Patient received in recliner chair eager to participate in mobility. Required min A sit>stand, upon standing pt required extra time and assistance to gain bearing before ambulating. Also needed environmental cues to navigate in room and hallway, pt constantly veering right. Ambulated in hallway min G with slow gait. Returned to room without complaint or incident. Was left in recliner with all needs met, call bell in reach. ? ?04/02/2022 ?5:23 PM ? ?[place] Izayah Miner, CMS, BS EXP ?Acute Rehabilitation Services  ?SFSEL:[phone] ?Office: [phone] ? ?

## 2022-04-02 NOTE — Progress Notes (Signed)
[facility]) Hospital Liaison Note  Notified by TOC manager of patient/family request for [facility] palliative services at home after discharge.   [facility] hospital liaison will follow patient for discharge disposition.   Please call with any hospice or outpatient palliative care related questions.   Thank you for the opportunity to participate in this patient's care.   Shanita Wicker, LCSW [facility] Hospital Liaison [phone]  

## 2022-04-02 NOTE — Progress Notes (Addendum)
Patient ID: Jasmine [name], female   DOB: [birth year], 88 y.o.   MRN: [id number] ?  ? ? Advanced Heart Failure Rounding Note ? ?PCP-Cardiologist: Jasmine Grooms, MD  ? ?Subjective:   ? ?Jasmine [name] 4/20  ?RA = 14 (Marked v-waves) ?RV = 48/14 ?PA = 61/24 (42) ?PCW = 29 ?Fick cardiac output/index = 3.6/2.0 ?Thermo CO/CI = 3.1/1.7 ?PVR = 4.5 WU ?Ao sat = 98% ?PA sat = 50%,54% ?SVC sat = 58%  ?Assessment: ?1. Moderate mixed pulmonary HTN ?2. Severely elevated left-sided filling pressures ?3. Moderately to severely reduced CO ?4. Probably occluded distal RIJ ? ?Started on amio for A Fib RVR/PVCs. Post cath started on IV lasix + milrinone. ? ?4/23 Diuretics held d/t worsening renal function.  ?4/24 Milrinone increased to 0.25 mcg. Diuresed with high dose IV lasix. Continued on amio drip.  ? ?Remains on milrinone 0.25 mcg + amio drip.  ? ?Had IV infiltrate this morning.  ? ?Denies SOB. Complaining of LUE pain.  ? ? ?Objective:   ?Weight Range: ?64.3 kg ?Body mass index is 24.33 kg/m?.  ? ?Vital Signs:   ?Temp:  [97.1 ?F (36.2 ?C)-98.2 ?F (36.8 ?C)] 97.1 ?F (36.2 ?C) (04/25 9892) ?Pulse Rate:  [96-135] 135 (04/25 0958) ?Resp:  [20-25] 20 (04/25 0016) ?BP: (113-138)/(63-88) 130/88 (04/25 0929) ?SpO2:  [93 %-99 %] 99 % (04/25 0400) ?Weight:  [64.3 kg] 64.3 kg (04/25 0504) ?Last BM Date : 03/31/22 ? ?Weight change: ?Filed Weights  ? 03/30/22 0449 04/01/22 0416 04/02/22 0504  ?Weight: 71.6 kg 68.7 kg 64.3 kg  ? ? ?Intake/Output:  ? ?Intake/Output Summary (Last 24 hours) at 04/02/2022 1003 ?Last data filed at 04/02/2022 0503 ?Gross per 24 hour  ?Intake 1166.95 ml  ?Output 5000 ml  ?Net -3833.05 ml  ?  ? ? ?Physical Exam  ?  ?General:  Elderly. Sitting in the chair. No resp difficulty ?HEENT: normal ?Neck: supple. JVP 9-10. Carotids 2+ bilat; no bruits. No lymphadenopathy or thryomegaly appreciated. ?Cor: PMI nondisplaced. Regular rate & rhythm. No rubs, gallops or murmurs. ?Lungs: clear ?Abdomen: soft, nontender, nondistended. No  hepatosplenomegaly. No bruits or masses. Good bowel sounds. ?Extremities: no cyanosis, clubbing, rash, edema. L forearm ecchymotic / indurated with necrotic slough 1x1 cm. ?Neuro: alert & orientedx3, cranial nerves grossly intact. moves all 4 extremities w/o difficulty. Affect pleasant ? ? ? ?TELEMETRY  ?A fib with frequent PVCs 15-25 per hour and NSVT ? ?Labs  ?  ?CBC ?No results for input(s): WBC, NEUTROABS, HGB, HCT, MCV, PLT in the last 72 hours. ? ?Basic Metabolic Panel ?Recent Labs  ?  04/01/22 ?2139 04/02/22 ?0310  ?NA 129* 126*  ?K 4.6 4.4  ?CL 93* 91*  ?CO2 21* 22  ?GLUCOSE 168* 149*  ?BUN 63* 60*  ?CREATININE 2.55* 2.36*  ?CALCIUM 8.5* 8.6*  ?MG 1.9 2.3  ? ?Liver Function Tests ?No results for input(s): AST, ALT, ALKPHOS, BILITOT, PROT, ALBUMIN in the last 72 hours. ? ?No results for input(s): LIPASE, AMYLASE in the last 72 hours. ?Cardiac Enzymes ?No results for input(s): CKTOTAL, CKMB, CKMBINDEX, TROPONINI in the last 72 hours. ? ?BNP: ?BNP (last 3 results) ?Recent Labs  ?  03/14/22 ?1502 03/30/2022 ?2126  ?BNP 1,424.0* 1,555.0*  ? ? ?ProBNP (last 3 results) ?No results for input(s): PROBNP in the last 8760 hours. ? ? ?D-Dimer ?No results for input(s): DDIMER in the last 72 hours. ?Hemoglobin A1C ?No results for input(s): HGBA1C in the last 72 hours. ?Fasting Lipid Panel ?No results for  input(s): CHOL, HDL, LDLCALC, TRIG, CHOLHDL, LDLDIRECT in the last 72 hours. ?Thyroid Function Tests ?No results for input(s): TSH, T4TOTAL, T3FREE, THYROIDAB in the last 72 hours. ? ?Invalid input(s): FREET3 ? ?Other results: ? ? ?Imaging  ? ? ?No results found. ? ? ?Medications:   ? ? ?Scheduled Medications: ? apixaban  2.5 mg Oral BID  ? levothyroxine  25 mcg Oral Q0600  ? pantoprazole  40 mg Oral Daily  ? sodium chloride flush  3 mL Intravenous Q12H  ? ? ?Infusions: ? sodium chloride 10 mL (03/25/22 0222)  ? sodium chloride    ? amiodarone 30 mg/hr (04/02/22 0411)  ? furosemide 120 mg (04/02/22 0503)  ? milrinone 0.25  mcg/kg/min (04/02/22 0412)  ? ? ?PRN Medications: ?sodium chloride, sodium chloride, acetaminophen, HYDROmorphone (DILAUDID) injection, nitroGLYCERIN, ondansetron (ZOFRAN) IV, sodium chloride flush ? ? ? ?Patient Profile  ? ?  ?88 y/o woman with chronic AF, CKD IV and severe RV failure admitted with volume overload and recent syncope. Has been struggling with fluid management which has been c/b CKD.  ? ?Assessment/Plan  ? ?Syncope: ?-Felt to be likely d/t preload dependency in setting of RV failure and possible low-output ?-On atenolol 100 mg daily at home - held on admit (HR 50s).  ?-No significant pauses.  ?  ?2. HFpEF with RV failure: ?-Echo: EF 55-60%, ventricular septum flattened in systolic and diastole c/w RV pressure and volume overload, RV moderately reduced, RV severely enlarged, severe BAE, mild MR, severe TR, RVSP 45 mmHg ?-BNP 1,555 ?-? Low output. Lactic acid 1.3 on admit.  ?- RHC with elevated filling pressures. RA 14, PCWP  29, CO 3.6, CI 1.78 ?- She is on milrinone 0.25 for RV support. Cut back milrinone to 0.125 mcg . Stop in am.  ?- Volume status improved.  Tomorrow start torsemide 60 mg daily. ?- Off spironolactone with elevated creatinine.  ?- Renal function worse 1.86 => 2.16 => 2.53=>2.55=>2.36 ?- Weight down another 10 pounds. Overall weight down 18 pounds.  ?  ?3. Severe Tricuspid Regurgitation: ?- Noted on echo this admit ?  ?4. Permanent atrial fibrillation: ?-Using amiodarone gtt for rate control while on milrinone.. Stop amio drip.  ?-continue Eliquis 2.5 mg BID (appropriate dose given age and renal functio. ?  ?5. Hypothyroidism: ?-On levothyroxine ?-TSH 5.8 ?  ?6. PVCs/NSVT: ?-likely d/t dilated RV ?-Keep K > 4 and Mag > 2. High PVC burden with NSVT.  ?- Stop amio drip. Start amio 200 mg twice a day. Add Mexiletine.  ? ?7. CKD Stage IIIb ?-Creatinine 2>1.7 > 1.9 > 1.86 > 2.16>2.5>2.36 ?-Hold diuretics.  ?-Would not be a candidate for HD ? ?8. IV Infiltrate, LUE ?IV infiltrated .  Milrinone and amio infiltrated.  ? ? ?Length of Stay: 9 ? ?Jasmine Grinder, NP  ?04/02/2022, 10:03 AM ? ?Advanced Heart Failure Team ?Pager [phone] (M-F; 7a - 5p)  ?Please contact [facility] for night-coverage after hours (5p -7a ) and weekends on [email or web address] ? ?Patient seen and examined with the above-signed Advanced Practice Provider and/or Housestaff. I personally reviewed laboratory data, imaging studies and relevant notes. I independently examined the patient and formulated the important aspects of the plan. I have edited the note to reflect any of my changes or salient points. I have personally discussed the plan with the patient and/or family. ? ?Patient seen and examined with the above-signed Advanced Practice Provider and/or Housestaff. I personally reviewed laboratory data, imaging studies and relevant notes. I independently examined the patient  and formulated the important aspects of the plan. I have edited the note to reflect any of my changes or salient points. I have personally discussed the plan with the patient and/or family. ? ?Now on milrinone and iv lasix. Good diuresis yesterday. Feels weak. Denies SBO, orthopnea or PND. Arm sore due to IV infiltrate. IV amio stopped. Scr slightly improved ? ?General:  Elderly Weak appearing. No resp difficulty ?HEENT: normal ?Neck: supple. JVP to ear prominent v waves. Carotids 2+ bilat; no bruits. No lymphadenopathy or thryomegaly appreciated. ?Cor: PMI nondisplaced. Irregular rate & rhythm.2/6 TR ?Lungs: clear ?Abdomen: soft, nontender, nondistended. No hepatosplenomegaly. No bruits or masses. Good bowel sounds. ?Extremities: no cyanosis, clubbing, rash, 3+ edema  IV infiltrate LUL  ?Neuro: alert & orientedx3, cranial nerves grossly intact. moves all 4 extremities w/o difficulty. Affect pleasant ? ?She has end-stage diastolic HF with severe RV involvement. Milrinone increased yesterday. Has improved diuresis but remains markedly fluid overloaded. Situation c/b IV  infiltrate,  ?  ?I reiterated to her and her sister today that I think Hospice is the only option. ? ?Glori Bickers, MD  ?6:53 PM ? ?

## 2022-04-02 NOTE — Consult Note (Signed)
? ?                                                                                ?Consultation Note ?Date: 04/02/2022  ? ?Patient Name: RAYSA BOSAK  ?DOB: [birth year]  MRN: [id number]  Age / Sex: 88 y.o., female  ?PCP: [name], MD ?Referring Physician: Tawni Millers,* ? ?Reason for Consultation: Establishing goals of care and Psychosocial/spiritual support ? ?HPI/Patient Profile: 88 y.o. female  admitted on 03/25/2022 with decompensated heart failure, advanced RV failure and pulmonary hypertension.  ?  ?Past medical history significant of chronic atrial fibrillation, diastolic heart failure, hypertension, chronic kidney disease stage 4, hypothyroidism, TIA, vertigo and DVT, who presented after a syncope episode. She was found on her recliner not responsive for 15 minutes, EMS was called, she slowly recovered her consciousness and she was transported to the ED.  ? ?Today is day 9 of this hospital stay ? ?Per cardiology assessment for moderate pulmonary hypertension, severely elevated left-sided filling pressures, moderate to severe reduced cardiac output, likely occluded distal RIJ.  Patient currently on IV milrinone. ? ?Patient remains weak, dyspneic on minimal exertion ? ?Patient and family face treatment option decisions, advanced directive decisions and anticipatory care needs. ? ?  ? ?Clinical Assessment and Goals of Care: ? ?This NP Wadie Lessen reviewed medical records, received report from team, assessed the patient and then meet at her bedside to discuss diagnosis, prognosis, GOC, EOL wishes disposition and options. ?  ?Concept of Palliative Care was introduced as specialized medical care for people and their families living with serious illness.  If focuses on providing relief from the symptoms and stress of a serious illness.  The goal is to improve quality of life for both the patient and the family. ? ?Values and goals of care important to patient and family were attempted to be  elicited. ? ?Created space and opportunity for patient  to explore thoughts and feelings regarding current medical situation.  She briefly speaks to her medical situation, but mostly shares with me family stories most specifically ? ?Education offered on patient's current medical situation and the likely trajectory of serious heart disease with multiple comorbidities in the elderly. ? ?I spoke to patient's sister by phone and discussed the above with her also ?  ?  ?Questions and concerns addressed.  Patient  encouraged to call with questions or concerns.   ?  ?PMT will continue to support holistically. ?  ?  ?  ?  ? ?No documented healthcare power of attorney or advanced care planning documents currently.  Patient tells me that she would want her sister/Marilyn Guettler to be her decision-maker in the event that she cannot speak for herself.  Encourage patient and family to consider completing documents with the support of our spiritual care department, declined at this time ?  ?  ? ?SUMMARY OF RECOMMENDATIONS   ? ?Code Status/Advance Care Planning: ?Full code ?Educated patient/family to consider DNR/DNI status understanding evidenced based poor outcomes in similar hospitalized patient, as the cause of arrest is likely associated with advanced chronic illness rather than an easily reversible acute cardio-pulmonary event.  ? ? ? ?Palliative Prophylaxis:  ?  Delirium Protocol and Frequent Pain Assessment ? ?Additional Recommendations (Limitations, Scope, Preferences): ?Full Scope Treatment ? ?Psycho-social/Spiritual:  ?Desire for further Chaplaincy support:no-declined ? ? ?Prognosis:  ?Unable to determine, likely long term limited prognosis ? ?Discharge Planning: To Be Determined  ? ?  ? ?Primary Diagnoses: ?Present on Admission: ? Syncope ? Stage 4 chronic kidney disease ([place]) ? Atrial fibrillation ([place]) ? Hypertension ? Acute on chronic diastolic CHF (congestive heart failure) ([place]) ? Cellulitis ?  Hypothyroidism ? ? ?I have reviewed the medical record, interviewed the patient and family, and examined the patient. The following aspects are pertinent. ? ?Past Medical History:  ?Diagnosis Date  ? Allergic rhinitis due to pollen   ? Atrial fibrillation ([place]) 12/23/2011  ? Atrial fibrillation with rapid ventricular response ([place])   ? Chronic diastolic HF (heart failure) ([place]) 11/21/2012  ? CKD (chronic kidney disease) stage 3, GFR 30-59 ml/min ([place]) 04/19/2019  ? Congestive heart failure, unspecified   ? patient states she is not aware of this  ? Deep venous thrombosis of lower extremity ([place]) 06/02/2012  ? Deviated septum 01/11/2020  ? Dizziness   ? DVT (deep venous thrombosis) ([facility]) left leg  ? Epistaxis 01/11/2020  ? Essential hypertension, malignant   ? GERD (gastroesophageal reflux disease)   ? Heart murmur   ? Insulin resistance   ? History of insulin resistance.  ? Insulin resistance   ? Long term current use of anticoagulant therapy 02/08/2014  ? coumadin   ? Obesity   ? Pure hypercholesterolemia   ? TIA (transient ischemic attack) 10/02/2018  ? Vertigo   ? ?Social History  ? ?Socioeconomic History  ? Marital status: Single  ?  Spouse name: Not on file  ? Number of children: Not on file  ? Years of education: Not on file  ? Highest education level: Not on file  ?Occupational History  ? Not on file  ?Tobacco Use  ? Smoking status: Never  ? Smokeless tobacco: Never  ?Substance and Sexual Activity  ? Alcohol use: No  ? Drug use: No  ? Sexual activity: Not Currently  ?  Birth control/protection: Post-menopausal  ?Other Topics Concern  ? Not on file  ?Social History Narrative  ? Not on file  ? ?Social Determinants of Health  ? ?Financial Resource Strain: Not on file  ?Food Insecurity: Not on file  ?Transportation Needs: Not on file  ?Physical Activity: Not on file  ?Stress: Not on file  ?Social Connections: Not on file  ? ?Family History  ?Problem Relation Age of Onset  ? Diabetes Mother   ? Hypertension Sister   ?  Pulmonary embolism Sister   ? ?Scheduled Meds: ? amiodarone  200 mg Oral BID  ? apixaban  2.5 mg Oral BID  ? levothyroxine  25 mcg Oral Q0600  ? mexiletine  150 mg Oral Q12H  ? pantoprazole  40 mg Oral Daily  ? sodium chloride flush  3 mL Intravenous Q12H  ? ?Continuous Infusions: ? sodium chloride 10 mL (03/25/22 0222)  ? sodium chloride    ? milrinone 0.25 mcg/kg/min (04/02/22 0412)  ? ?PRN Meds:.sodium chloride, sodium chloride, acetaminophen, HYDROmorphone (DILAUDID) injection, nitroGLYCERIN, ondansetron (ZOFRAN) IV, sodium chloride flush ?Medications Prior to Admission:  ?Prior to Admission medications   ?Medication Sig Start Date End Date Taking? Authorizing Provider  ?atenolol (TENORMIN) 100 MG tablet Take 100 mg by mouth daily. 12/14/20  Yes [provider]  ?ELIQUIS 2.5 MG TABS tablet Take 2.5 mg by mouth 2 (two)  times daily. 08/17/19  Yes [provider]  ?EUTHYROX 25 MCG tablet Take 25 mcg by mouth daily before breakfast. 03/07/20  Yes [provider]  ?furosemide (LASIX) 80 MG tablet Take 80 mg by mouth daily.   Yes [provider]  ?irbesartan (AVAPRO) 75 MG tablet Take 1 tablet by mouth once daily ?Patient taking differently: Take 75 mg by mouth daily. 05/14/21  Yes Belva Crome, MD  ?cefpodoxime (VANTIN) 200 MG tablet Take 200 mg by mouth daily. ?Patient not taking: Reported on 03/13/2022 01/18/22   [provider]  ?doxycycline (VIBRA-TABS) 100 MG tablet Take 100 mg by mouth See admin instructions. Bid x 10 days ?Patient not taking: Reported on 03/24/2022 02/28/22   [provider]  ?metolazone (ZAROXOLYN) 5 MG tablet Take 5 mg by mouth once a week. ?Patient not taking: Reported on 03/12/2022 02/06/22   [provider]  ? ?Allergies  ?Allergen Reactions  ? Lovenox [Enoxaparin]   ? Norvasc [Amlodipine Besylate]   ?  Pt unsure   ? Sulfa Antibiotics   ?  Dizziness   ? Codeine Rash  ? Latex Rash  ? ?Review of Systems  ?Neurological:  Positive for  weakness.  ? ?Physical Exam ?Cardiovascular:  ?   Rate and Rhythm: Tachycardia present.  ?Pulmonary:  ?   Effort: Pulmonary effort is normal.  ?Skin: ?   General: Skin is warm and dry.  ?Neurological:  ?   Mental Status: She is alert.  ?

## 2022-04-02 NOTE — Progress Notes (Signed)
?Progress Note ? ? ?Patient: Jasmine [name] HTD:[id number] DOB: 1934/05/21 DOA: 04/01/2022     9 ?DOS: the patient was seen and examined on 04/02/2022 ?  ?Brief hospital course: ?Mrs. Sheets was admitted to the hospital with the working diagnosis of decompensated heart failure, advanced RV failure and pulmonary hypertension.  ? ?88 yo female with the past medical history of chronic atrial fibrillation, diastolic heart failure, hypertension, chronic kidney disease stage 4, hypothyroidism, TIA, vertigo and DVT, who presented after a syncope episode. She was found on her recliner not responsive for 15 minutes, EMS was called, she slowly recovered her consciousness and she was transported to the ED. On her initial physical examination her blood pressure was 135/84, HR 38 to 58, RR 17 and 02 saturation 100%, lungs with no wheezing or rales, heart with S1 and S2 present and irregular, abdomen not distended, positive ++++ lower extremity edema with blisters.  ? ?Na 135, K 5,3 cl 110, bicarbonate 18, glucose 112 bun 52 cr 1,86 ?BNP 1,555 ?High sensitive troponin is 33 ?Wbc 4.2 hgb 13.1 plt  73  ?Sars covid 19 negative  ? ?Head CT negative for acute changes.  ? ?Chest radiograph with cardiomegaly, bilateral hilar vascular congestion. ? ?EKG 53 bpm, right axis, normal qtc, atrial fibrillation rhythm, with no significant ST segment or T wave changes.  ? ?Patient was placed on furosemide for diuresis. ?Echocardiogram with significant tricuspid regurgitation and moderate pulmonary hypertension.  ? ?Cardiac catheterization with low cardiac output 3.6 and index 2, (Fick), and thermodilution 3,1 and 1.7.  ?PCWP 29 and mean PA 42.  ?Patient was placed on inotropic support and further aggressive diuresis.  ?Amiodarone for atrial fibrillation.  ? ?04/ 22 patient responding to diuresis, but not achieving normovolemia. ?Very poor prognosis due to pulmonary hypertension and end stage heart failure. ?04/15 hospice has been consulted.   ?Transitioning to oral cardiac agents.  ? ?Assessment and Plan: ?* Acute on chronic diastolic CHF (congestive heart failure) ([place]) ?Echocardiogram with preserved LV systolic function EF 55 to 60%, ventricular septum flattened in systole and diastole consistent with RV pressure and volume overload. Moderate reduction in RV systolic dysfunction, RV cavity with severe enlargement, RVSP 44,8. Severe dilatation of bilateral atriums. Small pericardial effusion. Severe tricuspid valve regurgitation.  ? ?Pulmonary hypertension with severe RV failure.  ?04/20 cardiac catheterization with mean PA 42, PCWP 29, Cardiac output/ index 3.6 and 2,0 (Fick) and 3.1 and 1.7 (thermodilution).  ? ?Urine output documented 5000 cc over last 24 hrs  ?Systolic blood pressure 726 to 150 mmHg.  ? ?On inotropic support with milrinone ? ?04/22 metolazone 2.5 mg ?04/24 resume furosemide 120 mg q 8 hrs.   ?04/25 transitioned to oral torsemide.  ? ?Holding  spironolactone in the setting of worsening renal function.  ?Out of bed to chair, continue to encourage mobility.  ? ?Positive edema, despite aggressive medical therapy, likely patient in a end stage heart failure condition.  ? ? ? ? ?Atrial fibrillation ([place]) ?Persistent RVR related atrial fibrillation, ? ?Improved rate control with amiodarone. ?Plan to transition to oral amiodarone and added mexiletine, to suppress ectopy.  ? ? ?Hypertension ?Continue inotropic support with milrinone through peripheral line, dose has been decreased.  ?Continue to wean per cardiology recommendations.  ?Continue diuresis with torsemide.  ? ?Stage 4 chronic kidney disease ([place]) ?Hyponatremia, non anion gap metabolic acidosis.  ? ?04/22 metolazone .  ?Transition from furosemide IV to torsemide po.  ?Continue to have hypervolemia, she had 5000 ml urine  over last 24 hrs. ?Renal function today with serum cr at 2.36, K 4,4 Na 126, and serum bicarbonate at 22.  ?Plan to continue diuresis with torsemide ?Follow renal  function in am.  ? ?Syncope ?Multifactorial, patient with RV failure and pulmonary hypertension. 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  ? ?Patient uses a walker for ambulation at home.  ? ?EEG with no seizures.  ? ?Cellulitis ?Significant improvement in rash bilateral legs. Likely skin changes due to peripheral vascular disease. ? ?Antibiotic therapy has been discontinued.  ?Cellulitis has been ruled out.  ? ?04/25 Patient had IV infiltration on her left upper extremity, continue local skin care.  ?Pain control with analgesics.  ? ?Hypothyroidism ?Continue with levothyroxine  ? ? ? ? ?  ? ?Subjective: Patient has been confused with mild disorientation. No agitation, continue to have edema.  ? ?Physical Exam: ?Vitals:  ? 04/02/22 0929 04/02/22 0958 04/02/22 1238 04/02/22 1343  ?BP: 130/88  (!) 155/93   ?Pulse:  (!) 135    ?Resp:   18 20  ?Temp: (!) 97.1 ?F (36.2 ?C)  (!) 97.5 ?F (36.4 ?C) (!) 97.3 ?F (36.3 ?C)  ?TempSrc: Axillary  Oral Axillary  ?SpO2:      ?Weight:      ? ?Neurology awake and alert, mild confusion and disorientation but not agitation  ?ENT with no pallor ?Cardiovascular with S1 and S2 present irregularly irregular, loud p2 with no rubs, positive 3/6 systolic murmur a the right sternal border ?Positive JVD ?Positive lower extremity edema ++ pitting bilaterally ?Respiratory with bilateral rales and scattered rhonchi ?Abdomen with no distention  ?Data Reviewed: ? ? ? ?Family Communication: no family at the bedside  ? ?Disposition: ?Status is: Inpatient ?Remains inpatient appropriate because: heart failure  ? Planned Discharge Destination: Home with possible hopsice  ? ? ? ?Author: ?Tawni Millers, MD ?04/02/2022 2:46 PM ? ?For on call review [email or web address].  ?

## 2022-04-03 DIAGNOSIS — Z7189 Other specified counseling: Secondary | ICD-10-CM | POA: Diagnosis not present

## 2022-04-03 DIAGNOSIS — R0602 Shortness of breath: Secondary | ICD-10-CM | POA: Diagnosis not present

## 2022-04-03 DIAGNOSIS — N184 Chronic kidney disease, stage 4 (severe): Secondary | ICD-10-CM | POA: Diagnosis not present

## 2022-04-03 DIAGNOSIS — I5033 Acute on chronic diastolic (congestive) heart failure: Secondary | ICD-10-CM | POA: Diagnosis not present

## 2022-04-03 LAB — BASIC METABOLIC PANEL
Anion gap: 14 (ref 5–15)
BUN: 64 mg/dL — ABNORMAL HIGH (ref 8–23)
CO2: 22 mmol/L (ref 22–32)
Calcium: 8.8 mg/dL — ABNORMAL LOW (ref 8.9–10.3)
Chloride: 90 mmol/L — ABNORMAL LOW (ref 98–111)
Creatinine, Ser: 2.64 mg/dL — ABNORMAL HIGH (ref 0.44–1.00)
GFR, Estimated: 17 mL/min — ABNORMAL LOW (ref >60)
Glucose, Bld: 145 mg/dL — ABNORMAL HIGH (ref 70–99)
Potassium: 5.2 mmol/L — ABNORMAL HIGH (ref 3.5–5.1)
Sodium: 126 mmol/L — ABNORMAL LOW (ref 135–145)

## 2022-04-03 MED ORDER — POLYETHYLENE GLYCOL 3350 17 G PO PACK
17.0000 g | PACK | Freq: Every day | ORAL | Status: DC | PRN
  Administered 2022-04-04: 17 g via ORAL
  Filled 2022-04-03: qty 1

## 2022-04-03 MED ORDER — POLYETHYLENE GLYCOL 3350 17 G PO PACK
17.0000 g | PACK | Freq: Once | ORAL | Status: AC
Start: 2022-04-03 — End: 2022-04-03
  Administered 2022-04-03: 17 g via ORAL
  Filled 2022-04-03: qty 1

## 2022-04-03 MED ORDER — DOCUSATE SODIUM 100 MG PO CAPS
100.0000 mg | ORAL_CAPSULE | Freq: Two times a day (BID) | ORAL | Status: DC
  Administered 2022-04-03 – 2022-04-05 (×6): 100 mg via ORAL
  Filled 2022-04-03 (×6): qty 1

## 2022-04-03 MED ORDER — AMIODARONE HCL 200 MG PO TABS
400.0000 mg | ORAL_TABLET | Freq: Two times a day (BID) | ORAL | Status: DC
Start: 2022-04-03 — End: 2022-04-06
  Administered 2022-04-03 – 2022-04-05 (×5): 400 mg via ORAL
  Filled 2022-04-03 (×5): qty 2

## 2022-04-03 NOTE — Progress Notes (Signed)
?PROGRESS NOTE ? ? ? ?Jasmine [name]  EUM:[id number] DOB: [birth year] DOA: 03/21/2022 ?PCP: [name], MD  ?87/F with history of chronic diastolic CHF, CKD 4, hypertension, atrial fibrillation, hypothyroidism, vertigo, DVT admitted with end-stage diastolic CHF, severe RV failure ?-Right heart cath noted elevated filling pressures, mixed pulmonary hypertension, moderate to severely reduced cardiac output ?-Started on milrinone and IV Lasix ?-Seen by palliative care this admission, wishes to remain full code with full scope of treatment ? ?Subjective: ?-Feels a bit weak, otherwise okay overall ? ?Assessment and Plan: ? ?Acute on chronic diastolic CHF (congestive heart failure) ([place]) ?End-stage diastolic CHF ?Pulmonary hypertension with severe RV failure, severe TR ?-Echo with EF of 55 to 60%, RV pressure and volume overload. Moderate reduction in RV systolic dysfunction, RV cavity with severe enlargement, severe TR ?-Right heart cath 4/20 with significantly elevated filling pressures, mixed pulmonary hypertension, moderate to severely reduced cardiac output ?-Diuresed with IV Lasix, she is 14.2 L negative, Lasix on hold ?-Remains on IV milrinone ?-GDMT limited by AKI/CKD ?-Seen by palliative care, she remains full code and wishes for full scope of treatment, high risk of quick readmission ? ?Permanent atrial fibrillation ([place]) ?-Remains on amnio gtt. while on milrinone  ?-Continue Eliquis ? ?Stage 4 chronic kidney disease ([place]) ?Hyponatremia, non anion gap metabolic acidosis.  ?-Fluctuation in creatinine noted, baseline creatinine around 1.6, now 2.6, cardiorenal in etiology ? ?Syncope ?Multifactorial, patient with RV failure and pulmonary hypertension. ?Likely secondary to end stage RV failure.  ?-Also had EEG this admission, negative for seizures ? ?Chronic venous stasis ?-Was briefly on antibiotics, now discontinued ? ?Hypothyroidism ?Continue with levothyroxine, TSH was mildly elevated, will repeat ? ?DVT  prophylaxis: Apixaban ?Code Status: Full code ?Family Communication: Discussed with patient in detail, no family at bedside ?Disposition Plan: Home ? ?Consultants:  ?Heart failure team ? ?Procedures:  ? ?Antimicrobials:  ? ? ?Objective: ?Vitals:  ? 04/03/22 0722 04/03/22 1000 04/03/22 1100 04/03/22 1219  ?BP: (!) 151/102 (!) 156/82 (!) 156/96 (!) 134/105  ?Pulse: 98 (!) 111 (!) 132 (!) 132  ?Resp: (!) 22 (!) 24 (!) 27 (!) 26  ?Temp: (!) 97.5 ?F (36.4 ?C) (!) 97.5 ?F (36.4 ?C) (!) 97.5 ?F (36.4 ?C) 97.6 ?F (36.4 ?C)  ?TempSrc: Oral Oral Oral Oral  ?SpO2: 95% 93% 94% 94%  ?Weight:      ? ? ?Intake/Output Summary (Last 24 hours) at 04/03/2022 1410 ?Last data filed at 04/03/2022 1221 ?Gross per 24 hour  ?Intake 150 ml  ?Output 5300 ml  ?Net -5150 ml  ? ?Filed Weights  ? 04/01/22 0416 04/02/22 0504 04/03/22 0507  ?Weight: 68.7 kg 64.3 kg 67 kg  ? ? ?Examination: ? ?General exam: Appears calm and comfortable  ?Respiratory system: Clear to auscultation ?Cardiovascular system: S1 & S2 heard, RRR.  ?Abd: nondistended, soft and nontender.Normal bowel sounds heard. ?Central nervous system: Alert and oriented. No focal neurological deficits. ?Extremities: no edema ?Skin: No rashes ?Psychiatry:  Mood & affect appropriate.  ? ? ? ?Data Reviewed:  ? ?CBC: ?Recent Labs  ?Lab 03/29/2022 ?1507 03/14/2022 ?1513  ?HGB 13.9  13.6 12.9  ?HCT 41.0  40.0 38.0  ? ?Basic Metabolic Panel: ?Recent Labs  ?Lab 03/29/22 ?1528 03/30/22 ?0251 03/31/22 ?0225 04/01/22 ?5400 04/01/22 ?2139 04/02/22 ?8676 04/03/22 ?1950  ?NA  --    < > 131* 131* 129* 126* 126*  ?K  --    < > 4.7 4.6 4.6 4.4 5.2*  ?CL  --    < >  96* 96* 93* 91* 90*  ?CO2  --    < > 21* 23 21* 22 22  ?GLUCOSE  --    < > 185* 126* 168* 149* 145*  ?BUN  --    < > 60* 65* 63* 60* 64*  ?CREATININE  --    < > 2.16* 2.53* 2.55* 2.36* 2.64*  ?CALCIUM  --    < > 9.0 8.6* 8.5* 8.6* 8.8*  ?MG 2.1  --  2.0 1.9 1.9 2.3  --   ? < > = values in this interval not displayed.  ? ?GFR: ?Estimated Creatinine  Clearance: 14.7 mL/min (A) (by C-G formula based on SCr of 2.64 mg/dL (H)). ?Liver Function Tests: ?No results for input(s): AST, ALT, ALKPHOS, BILITOT, PROT, ALBUMIN in the last 168 hours. ?No results for input(s): LIPASE, AMYLASE in the last 168 hours. ?No results for input(s): AMMONIA in the last 168 hours. ?Coagulation Profile: ?No results for input(s): INR, PROTIME in the last 168 hours. ?Cardiac Enzymes: ?No results for input(s): CKTOTAL, CKMB, CKMBINDEX, TROPONINI in the last 168 hours. ?BNP (last 3 results) ?No results for input(s): PROBNP in the last 8760 hours. ?HbA1C: ?No results for input(s): HGBA1C in the last 72 hours. ?CBG: ?No results for input(s): GLUCAP in the last 168 hours. ?Lipid Profile: ?No results for input(s): CHOL, HDL, LDLCALC, TRIG, CHOLHDL, LDLDIRECT in the last 72 hours. ?Thyroid Function Tests: ?No results for input(s): TSH, T4TOTAL, FREET4, T3FREE, THYROIDAB in the last 72 hours. ?Anemia Panel: ?No results for input(s): VITAMINB12, FOLATE, FERRITIN, TIBC, IRON, RETICCTPCT in the last 72 hours. ?Urine analysis: ?   ?Component Value Date/Time  ? COLORURINE AMBER (A) 03/24/2022 0328  ? APPEARANCEUR HAZY (A) 03/24/2022 0328  ? LABSPEC 1.024 03/24/2022 0328  ? PHURINE 5.0 03/24/2022 0328  ? GLUCOSEU NEGATIVE 03/24/2022 0328  ? [place] NEGATIVE 03/24/2022 0328  ? [place] NEGATIVE 03/24/2022 0328  ? [place] NEGATIVE 03/24/2022 0328  ? PROTEINUR 30 (A) 03/24/2022 0328  ? NITRITE NEGATIVE 03/24/2022 0328  ? LEUKOCYTESUR NEGATIVE 03/24/2022 0328  ? ?Sepsis Labs: ?@LABRCNTIP (procalcitonin:4,lacticidven:4) ? ?)No results found for this or any previous visit (from the past 240 hour(s)).  ? ?Radiology Studies: ?No results found. ? ? ?Scheduled Meds: ? amiodarone  200 mg Oral BID  ? apixaban  2.5 mg Oral BID  ? docusate sodium  100 mg Oral BID  ? levothyroxine  25 mcg Oral Q0600  ? mexiletine  150 mg Oral Q12H  ? pantoprazole  40 mg Oral Daily  ? sodium chloride flush  3 mL Intravenous Q12H  ?  torsemide  60 mg Oral Daily  ? ?Continuous Infusions: ? sodium chloride 10 mL (03/25/22 0222)  ? sodium chloride    ? milrinone 0.125 mcg/kg/min (04/03/22 1346)  ? ? ? LOS: 10 days  ? ? ?Time spent: 68min ? ?Domenic Polite, MD ?[facility] ? ? ?04/03/2022, 2:10 PM  ?  ?

## 2022-04-03 NOTE — Progress Notes (Addendum)
Patient ID: Jasmine [name], female   DOB: Sep 26, 1934, 88 y.o.   MRN: [id number] ?  ? ? Advanced Heart Failure Rounding Note ? ?PCP-Cardiologist: Sinclair Grooms, MD  ? ?Subjective:   ? ?[place] 4/20  ?RA = 14 (Marked v-waves) ?RV = 48/14 ?PA = 61/24 (42) ?PCW = 29 ?Fick cardiac output/index = 3.6/2.0 ?Thermo CO/CI = 3.1/1.7 ?PVR = 4.5 WU ?Ao sat = 98% ?PA sat = 50%,54% ?SVC sat = 58%  ?Assessment: ?1. Moderate mixed pulmonary HTN ?2. Severely elevated left-sided filling pressures ?3. Moderately to severely reduced CO ?4. Probably occluded distal RIJ ? ?Started on amio for A Fib RVR/PVCs. Post cath started on IV lasix + milrinone. ? ?4/23 Diuretics held d/t worsening renal function.  ?4/24 Milrinone increased to 0.25 mcg. Diuresed with high dose IV lasix. Continued on amio drip.  ?4/25 Amio gtt d/c due to IV infiltrate. Milrinone reduced to 0.125  ? ?Remains on Milrinone 0.125.  ? ?Good diuresis yesterday, 5.5L in UOP. ? If wt is accurate, charted as 6 lb gain. SCr trending back up, 2.55>>2.36>>2.64.  ?K 5.2 but hemolyzed. Na 126. Planning to go home w/ palliative care.  ? ?She feels her dyspnea has improved some. Currently resting comfortably. Only complaint is constipation. No BM in 5 days. Bowel regimen ordered by primary team.  ? ? ?Objective:   ?Weight Range: ?67 kg ?Body mass index is 25.35 kg/m?.  ? ?Vital Signs:   ?Temp:  [97.1 ?F (36.2 ?C)-97.7 ?F (36.5 ?C)] 97.6 ?F (36.4 ?C) (04/26 1950) ?Pulse Rate:  [98-135] 98 (04/26 0512) ?Resp:  [18-22] 20 (04/26 0512) ?BP: (114-155)/(70-95) 120/72 (04/26 9326) ?SpO2:  [96 %-100 %] 96 % (04/26 0507) ?Weight:  [67 kg] 67 kg (04/26 0507) ?Last BM Date : 03/30/22 ? ?Weight change: ?Filed Weights  ? 04/01/22 0416 04/02/22 0504 04/03/22 0507  ?Weight: 68.7 kg 64.3 kg 67 kg  ? ? ?Intake/Output:  ? ?Intake/Output Summary (Last 24 hours) at 04/03/2022 0729 ?Last data filed at 04/03/2022 0535 ?Gross per 24 hour  ?Intake 150 ml  ?Output 5450 ml  ?Net -5300 ml  ?  ? ? ?Physical Exam   ?  ?General:  Elderly fatigued appearing female sitting up in bed. No respiratory difficulty ?HEENT: normal ?Neck: supple. JVD elevated to ear+ prominent V waves.. Carotids 2+ bilat; no bruits. No lymphadenopathy or thyromegaly appreciated. ?Cor: PMI nondisplaced. Irregularly irregular rhythm and rate. 2/6 TR murmur  ?Lungs: clear ?Abdomen: soft, nontender, nondistended. No hepatosplenomegaly. No bruits or masses. Good bowel sounds. ?Extremities: no cyanosis, clubbing, rash, 1+ b/l LE edema, + unna boots Lt forearm edematous + ecchymosis surrounding prior IV site.  ?Neuro: alert & oriented x 3, cranial nerves grossly intact. moves all 4 extremities w/o difficulty. Affect pleasant. ? ?TELEMETRY  ? ?Afib 110s, brief runs of NSVT 3-4 beats, personally reviewed  ? ?Labs  ?  ?CBC ?No results for input(s): WBC, NEUTROABS, HGB, HCT, MCV, PLT in the last 72 hours. ? ?Basic Metabolic Panel ?Recent Labs  ?  04/01/22 ?2139 04/02/22 ?0310 04/03/22 ?0353  ?NA 129* 126* 126*  ?K 4.6 4.4 5.2*  ?CL 93* 91* 90*  ?CO2 21* 22 22  ?GLUCOSE 168* 149* 145*  ?BUN 63* 60* 64*  ?CREATININE 2.55* 2.36* 2.64*  ?CALCIUM 8.5* 8.6* 8.8*  ?MG 1.9 2.3  --   ? ?Liver Function Tests ?No results for input(s): AST, ALT, ALKPHOS, BILITOT, PROT, ALBUMIN in the last 72 hours. ? ?No results for input(s): LIPASE, AMYLASE  in the last 72 hours. ?Cardiac Enzymes ?No results for input(s): CKTOTAL, CKMB, CKMBINDEX, TROPONINI in the last 72 hours. ? ?BNP: ?BNP (last 3 results) ?Recent Labs  ?  03/14/22 ?1502 03/26/2022 ?2126  ?BNP 1,424.0* 1,555.0*  ? ? ?ProBNP (last 3 results) ?No results for input(s): PROBNP in the last 8760 hours. ? ? ?D-Dimer ?No results for input(s): DDIMER in the last 72 hours. ?Hemoglobin A1C ?No results for input(s): HGBA1C in the last 72 hours. ?Fasting Lipid Panel ?No results for input(s): CHOL, HDL, LDLCALC, TRIG, CHOLHDL, LDLDIRECT in the last 72 hours. ?Thyroid Function Tests ?No results for input(s): TSH, T4TOTAL, T3FREE, THYROIDAB  in the last 72 hours. ? ?Invalid input(s): FREET3 ? ?Other results: ? ? ?Imaging  ? ? ?No results found. ? ? ?Medications:   ? ? ?Scheduled Medications: ? amiodarone  200 mg Oral BID  ? apixaban  2.5 mg Oral BID  ? docusate sodium  100 mg Oral BID  ? levothyroxine  25 mcg Oral Q0600  ? mexiletine  150 mg Oral Q12H  ? pantoprazole  40 mg Oral Daily  ? sodium chloride flush  3 mL Intravenous Q12H  ? torsemide  60 mg Oral Daily  ? ? ?Infusions: ? sodium chloride 10 mL (03/25/22 0222)  ? sodium chloride    ? milrinone 0.125 mcg/kg/min (04/02/22 1300)  ? ? ?PRN Medications: ?sodium chloride, sodium chloride, acetaminophen, HYDROmorphone (DILAUDID) injection, nitroGLYCERIN, ondansetron (ZOFRAN) IV, [START ON 04/04/2022] polyethylene glycol, sodium chloride flush ? ? ? ?Patient Profile  ? ?  ?88 y/o woman with chronic AF, CKD IV and severe RV failure admitted with volume overload and recent syncope. Has been struggling with fluid management which has been c/b CKD.  ? ?Assessment/Plan  ? ?Syncope: ?-Felt to be likely d/t preload dependency in setting of RV failure and possible low-output ?-On atenolol 100 mg daily at home - held on admit (HR 50s).  ?-No significant pauses.  ?  ?2. HFpEF with RV failure: ?-Echo: EF 55-60%, ventricular septum flattened in systolic and diastole c/w RV pressure and volume overload, RV moderately reduced, RV severely enlarged, severe BAE, mild MR, severe TR, RVSP 45 mmHg ?-BNP 1,555 ?-? Low output. Lactic acid 1.3 on admit.  ?- RHC with elevated filling pressures. RA 14, PCWP  29, CO 3.6, CI 1.78 ?- She is on milrinone 0.125 for RV support.  ?- Volume status improved.  Start torsemide 60 mg daily today  ?- Off spironolactone with elevated creatinine.  ?- Renal function worse 1.86 => 2.16 => 2.53=>2.55=>2.36=>2.64 ?  ?3. Severe Tricuspid Regurgitation: ?- Noted on echo this admit ?  ?4. Permanent atrial fibrillation: ?-continue PO amiodarone for rate control  ?-continue Eliquis 2.5 mg BID  (appropriate dose given age and renal function). ?  ?5. Hypothyroidism: ?-On levothyroxine ?-TSH 5.8 ?  ?6. PVCs/NSVT: ?-likely d/t dilated RV ?-Keep K > 4 and Mag > 2. High PVC burden with NSVT.  ?-Continue amio 200 mg twice a day + Mexiletine 150 mg bid.  ? ?7. CKD Stage IIIb ?-Creatinine 2>1.7 > 1.9 > 1.86 > 2.16>2.5>2.36>2.64   ?-Cardiorenal in setting of end staged RV failure  ?-Would not be a candidate for HD ? ?8. IV Infiltrate, LUE ?- IV infiltrated. Amio gtt discontinued  ? ?9. Hyponatremia ?- Na 126  ?- hypervolemic hyponatremia, end-stage HF  ? ?10. Constipation  ?- bowel regimen ordered by primary team  ? ?She has end-stage diastolic HF with severe RV involvement and worsening renal failure and  marked fluid overload. Not a candidate for advanced HF therapies nor HD. Plan is for transition back home w/ palliative care services. Transition to torsemide today and stop milrinone at time of d/c.  ? ? ?Length of Stay: 10 ? ?Lyda Jester, PA-C  ?04/03/2022, 7:29 AM ? ?Advanced Heart Failure Team ?Pager [phone] (M-F; 7a - 5p)  ?Please contact [facility] for night-coverage after hours (5p -7a ) and weekends on [email or web address] ? ?Patient seen and examined with the above-signed Advanced Practice Provider and/or Housestaff. I personally reviewed laboratory data, imaging studies and relevant notes. I independently examined the patient and formulated the important aspects of the plan. I have edited the note to reflect any of my changes or salient points. I have personally discussed the plan with the patient and/or family. ? ?Remains on milrinone. Feels weak. Denies SOB, orthopnea or PND. Swelling has gone down. LUE sore ? ?Remains in AF rates 120-130s ? ?General:  Weak appearing. No resp difficulty ?HEENT: normal ?Neck: supple.JVP 10 Carotids 2+ bilat; no bruits. No lymphadenopathy or thryomegaly appreciated. ?Cor: PMI nondisplaced. Irregular tachy . 2/6 TR ?Lungs: clear ?Abdomen: soft, nontender, nondistended. No  hepatosplenomegaly. No bruits or masses. Good bowel sounds. ?Extremities: no cyanosis, clubbing, rash, tr edema  LUE IV infiltrate with wound ?Neuro: alert & orientedx3, cranial nerves grossly intact. moves

## 2022-04-03 NOTE — Progress Notes (Signed)
PT Cancellation Note ? ?Patient Details ?Name: Jasmine [name] ?MRN: [id number] ?DOB: 20-Feb-1934 ? ? ?Cancelled Treatment:    Reason Eval/Treat Not Completed: Other (comment). RN reporting pt now with nausea and bouts of emesis. Will plan to follow-up tomorrow as able. ? ? ?Moishe Spice, PT, DPT ?Acute Rehabilitation Services  ?Pager: [phone] ?Office: [phone] ? ? ? ?[name] ?04/03/2022, 4:23 PM ? ? ?

## 2022-04-03 NOTE — Progress Notes (Signed)
OT Cancellation Note ? ?Patient Details ?Name: KERIA WIDRIG ?MRN: [id number] ?DOB: 08-19-1934 ? ? ?Cancelled Treatment:    Reason Eval/Treat Not Completed: Medical issues which prohibited therapy (RN requesting therapy hold due to n/v at BP at this time. Will follow up per POC.) ? ?Lynnda Child, OTD, OTR/L ?Acute Rehab ?[phone] ? ?Kaylyn Lim ?04/03/2022, 4:06 PM ?

## 2022-04-03 NOTE — Progress Notes (Signed)
?   04/03/22 1000  ?Assess: MEWS Score  ?Temp (!) 97.5 ?F (36.4 ?C)  ?BP (!) 156/82  ?Pulse Rate (!) 111  ?ECG Heart Rate (!) 132  ?Resp (!) 24  ?Level of Consciousness Alert  ?SpO2 93 %  ?O2 Device Room Air  ?Assess: MEWS Score  ?MEWS Temp 0  ?MEWS Systolic 0  ?MEWS Pulse 3  ?MEWS RR 1  ?MEWS LOC 0  ?MEWS Score 4  ?MEWS Score Color Red  ?Assess: if the MEWS score is Yellow or Red  ?Were vital signs taken at a resting state? Yes  ?Focused Assessment No change from prior assessment  ?Early Detection of Sepsis Score *See Row Information* Medium  ?MEWS guidelines implemented *See Row Information* Yes  ?Treat  ?MEWS Interventions Administered scheduled meds/treatments  ?Pain Scale 0-10  ?Pain Score 0  ?Take Vital Signs  ?Increase Vital Sign Frequency  Red: Q 1hr X 4 then Q 4hr X 4, if remains red, continue Q 4hrs  ?Escalate  ?MEWS: Escalate Red: discuss with charge nurse/RN and provider, consider discussing with RRT  ?Notify: Charge Nurse/RN  ?Name of Charge Nurse/RN Notified Carroll Kinds, [place]  ?Date Charge Nurse/RN Notified 04/03/22  ?Time Charge Nurse/RN Notified 1010  ?Notify: Provider  ?Provider Name/Title Domenic Polite MD  ?Date Provider Notified 04/03/22  ?Time Provider Notified 1005  ?Notification Type Rounds  ?Notification Reason Change in status  ?Provider response At bedside  ?Date of Provider Response 04/03/22  ?Time of Provider Response 1013  ?Document  ?Patient Outcome Stabilized after interventions  ?Progress note created (see row info) Yes  ? ? ?

## 2022-04-03 NOTE — Progress Notes (Signed)
?   04/03/22 1600  ?Mobility  ?Activity Contraindicated/medical hold ?(HR elevated 130-140 at rest)  ? ? ?

## 2022-04-03 NOTE — Progress Notes (Signed)
PT Cancellation Note ? ?Patient Details ?Name: Jasmine [name] ?MRN: [id number] ?DOB: 23-Feb-1934 ? ? ?Cancelled Treatment:    Reason Eval/Treat Not Completed: Medical issues which prohibited therapy. RN reporting pt's HR is resting in the 130-140s currently. Will plan to follow-up later as time permits provided her HR stabilizes. ? ?Moishe Spice, PT, DPT ?Acute Rehabilitation Services  ?Pager: [phone] ?Office: [phone] ? ? ? ?[name] ?04/03/2022, 10:47 AM ? ? ?

## 2022-04-03 NOTE — Progress Notes (Signed)
? ?  Rate remained 120-140s most of the day.  ? ?Increase amio to 400 mg twice a day .  ? ?Jasmine Bjelland NP-C  ?5:59 PM ? ?

## 2022-04-03 NOTE — Progress Notes (Signed)
Patient ID: Jasmine [name], female   DOB: 31-Jul-1934, 88 y.o.   MRN: [id number] ? ? ? ?Progress Note from the Palliative Medicine Team at [facility] ? ? ?Patient Name: Jasmine [name]        ?Date: 04/03/2022 ?DOB: 02/01/1934  Age: 88 y.o. MRN#: [id number] ?Attending Physician: Domenic Polite, MD ?Primary Care Physician: [name], MD ?Admit Date: 03/20/2022 ? ? ?Medical records reviewed  ?88 y.o. female  admitted on 04/07/2022 with decompensated heart failure, advanced RV failure and pulmonary hypertension.  ?  ?Past medical history significant of chronic atrial fibrillation, diastolic heart failure, hypertension, chronic kidney disease stage 4, hypothyroidism, TIA, vertigo and DVT, who presented after a syncope episode. She was found on her recliner not responsive for 15 minutes, EMS was called, she slowly recovered her consciousness and she was transported to the ED.  ? ?This NP visited patient at the bedside as a follow up to  yesterday's Micro, patient's sister/daughter [name] is at the bedside ?  ?Today is day 10 of this hospital stay ?  ?Per cardiology assessment for moderate pulmonary hypertension, severely elevated left-sided filling pressures, moderate to severe reduced cardiac output, likely occluded distal RIJ.  Patient currently on IV milrinone. ?  ?Patient remains weak, dyspneic on minimal exertion ?  ?Patient and family face treatment option decisions, advanced directive decisions and anticipatory care needs. ?  ?Education offered on patient's current medical situation and the likely trajectory of serious heart disease with multiple comorbidities in the elderly patient.. ?  ?  ?Education offered today regarding advanced directives.  Concepts specific to code status, artifical feeding and hydration, continued IV antibiotics and rehospitalization was had.  The difference between a aggressive medical intervention path  and a palliative comfort care path for this patient at this time was had.   Values and goals of care important to patient and family were attempted to be elicited. ?  ?MOST form introduced and left for review. ? ?         Code status: Remains a full code ? ?Educated patient/family to consider DNR/DNI status understanding evidenced based poor outcomes in similar hospitalized patient, as the cause of arrest is likely associated with advanced chronic illness rather than an easily reversible acute cardio-pulmonary event.  ? ?I attempted to engage patient and her sister in conversation regarding advance care planning however they deflect the conversation to beautiful life stories and accomplishments.  They expressed their love and appreciation for family, and trust that other family members who are doctors and lawyers will help guide them as they make decisions at this time. ? ?I strongly encouraged them both to speak with family members regarding advance care planning and consider anticipatory care needs once patient gets home. ? ?Patient was unable to make any shift/de-escalation in her treatment plan.  At this time she is open to all offered and available medical interventions to prolong life. ? ? ?Discussed with patient the importance of continued conversation with family and their  medical providers regarding overall plan of care and treatment options,  ensuring decisions are within the context of the patients values and GOCs. ? ?Questions and concerns addressed   Discussed with nursing team  ? ? ?Wadie Lessen NP  ?Palliative Medicine Team Team Phone # [phone] ?Pager [phone] ?  ?

## 2022-04-03 NOTE — Progress Notes (Signed)
TRH night cross cover note: ? ?I was notified by RN that the patient is constipated, with most recent bowel movement occurring 5 days ago. ? ?I subsequently ordered MiraLAX 17 g p.o. x1 dose now followed by MiraLAX daily prn, with first prn dose available on the morning of 04/04/2022. Additionally, I ordered Colace 100 mg p.o. twice daily, with first dose now. ? ? ? ? ?Babs Bertin, DO ?Hospitalist ? ?

## 2022-04-04 DIAGNOSIS — R0602 Shortness of breath: Secondary | ICD-10-CM | POA: Diagnosis not present

## 2022-04-04 DIAGNOSIS — I5033 Acute on chronic diastolic (congestive) heart failure: Secondary | ICD-10-CM | POA: Diagnosis not present

## 2022-04-04 DIAGNOSIS — Z7189 Other specified counseling: Secondary | ICD-10-CM | POA: Diagnosis not present

## 2022-04-04 DIAGNOSIS — N184 Chronic kidney disease, stage 4 (severe): Secondary | ICD-10-CM | POA: Diagnosis not present

## 2022-04-04 LAB — COMPREHENSIVE METABOLIC PANEL
ALT: 22 U/L (ref 0–44)
AST: 39 U/L (ref 15–41)
Albumin: 2.8 g/dL — ABNORMAL LOW (ref 3.5–5.0)
Alkaline Phosphatase: 151 U/L — ABNORMAL HIGH (ref 38–126)
Anion gap: 18 — ABNORMAL HIGH (ref 5–15)
BUN: 70 mg/dL — ABNORMAL HIGH (ref 8–23)
CO2: 20 mmol/L — ABNORMAL LOW (ref 22–32)
Calcium: 9.2 mg/dL (ref 8.9–10.3)
Chloride: 91 mmol/L — ABNORMAL LOW (ref 98–111)
Creatinine, Ser: 3.18 mg/dL — ABNORMAL HIGH (ref 0.44–1.00)
GFR, Estimated: 14 mL/min — ABNORMAL LOW (ref >60)
Glucose, Bld: 224 mg/dL — ABNORMAL HIGH (ref 70–99)
Potassium: 5 mmol/L (ref 3.5–5.1)
Sodium: 129 mmol/L — ABNORMAL LOW (ref 135–145)
Total Bilirubin: 4.3 mg/dL — ABNORMAL HIGH (ref 0.3–1.2)
Total Protein: 7 g/dL (ref 6.5–8.1)

## 2022-04-04 LAB — CBC
HCT: 37.8 % (ref 36.0–46.0)
Hemoglobin: 12.4 g/dL (ref 12.0–15.0)
MCH: 32 pg (ref 26.0–34.0)
MCHC: 32.8 g/dL (ref 30.0–36.0)
MCV: 97.7 fL (ref 80.0–100.0)
Platelets: 184 10*3/uL (ref 150–400)
RBC: 3.87 MIL/uL (ref 3.87–5.11)
RDW: 16.2 % — ABNORMAL HIGH (ref 11.5–15.5)
WBC: 6.3 10*3/uL (ref 4.0–10.5)
nRBC: 0.5 % — ABNORMAL HIGH (ref 0.0–0.2)

## 2022-04-04 NOTE — Progress Notes (Signed)
Mobility Specialist Progress Note ? ? 04/04/22 1719  ?Mobility  ?Activity Stood at bedside;Transferred from bed to chair  ?Level of Assistance Moderate assist, patient does 50-74%  ?Assistive Device Front wheel walker  ?Distance Ambulated (ft) 4 ft  ?Activity Response Tolerated fair  ?$Mobility charge 1 Mobility  ? ?Pre Mobility: HR, 141/93 BP, 95% SpO2 on RA ?During Mobility: HR, BP, 90% SpO2 on RA ?Post Mobility: HR, 141/94 BP, 93% SpO2 on RA ? ?Received pt in bed having no appetite but agreeable to mobility. Requiring minA to get pt supine > EOB d/t LE + trunk weakness. ModA for remainder of session d/t constant posterior lean when standing, max cues for sequencing required d/t cognitive forgetfulness and max cues required for safe foot and hand placement. Left in the chair w/ call bell in reach and chair alarm on.   ? ?Holland Falling ?Mobility Specialist ?Phone Number [phone] ? ?

## 2022-04-04 NOTE — TOC Progression Note (Signed)
Transition of Care (TOC) - Progression Note  ? ? ?Patient Details  ?Name: ONYA EUTSLER ?MRN: [id number] ?Date of Birth: 1934/08/21 ? ?Transition of Care (TOC) CM/SW Contact  ?Overton Boggus, LCSW ?Phone Number: ?04/04/2022, 2:07 PM ? ?Clinical Narrative:    ?HF CSW attempted to visit Ms. Fomby at bedside to discuss PT's new recommendation of SNF. Ms. Vanbrocklin was sleeping and after waking Ms. Alessandrini reported that she didn't want to speak with the CSW but is agreeable for the CSW to call her sister. 2:06pm - HF CSW attempted to outreach Ms. Coggin sister, [name] [phone] however nobody answered the phone and the CSW was unable to leave a voicemail. CSW to attempt to speak with Ms. Buerger again later this afternoon about the SNF recommendation. ? ?CSW will continue to follow throughout discharge. ? ? ?Expected Discharge Plan: [place] ?Barriers to Discharge: Continued Medical Work up ? ?Expected Discharge Plan and Services ?Expected Discharge Plan: [place] ?  ?Discharge Planning Services: CM Consult ?Post Acute Care Choice: Home Health ?Living arrangements for the past 2 months: [place] ?                ?DME Arranged: 3-N-1 ?DME Agency: [employer] ?Date DME Agency Contacted: 04/02/22 ?Time DME Agency Contacted: 1100 ?  ?HH Arranged: RN, PT, Nurse's Aide ?[place] Agency: [facility] ?Date HH Agency Contacted: 04/02/22 ?Time [place]: 1101 ?  ? ? ?Social Determinants of Health (SDOH) Interventions ?  ? ?Readmission Risk Interventions ?   ? View : No data to display.  ?  ?  ?  ? ?Robert Lee, MSW, LCSW ?[phone] ?Heart Failure Social Worker  ?

## 2022-04-04 NOTE — Progress Notes (Signed)
Patient ID: Jasmine [name], female   DOB: October 01, 1934, 88 y.o.   MRN: [id number] ? ? ? ?Progress Note from the Palliative Medicine Team at [facility] ? ? ?Patient Name: Jasmine [name]        ?Date: 04/04/2022 ?DOB: 06/02/1934  Age: 88 y.o. MRN#: [id number] ?Attending Physician: Domenic Polite, MD ?Primary Care Physician: [name], MD ?Admit Date: 03/15/2022 ? ? ?Medical records reviewed  ?88 y.o. female  admitted on 03/09/2022 with decompensated heart failure, advanced RV failure and pulmonary hypertension.  ?  ?Past medical history significant of chronic atrial fibrillation, diastolic heart failure, hypertension, chronic kidney disease stage 4, hypothyroidism, TIA, vertigo and DVT, who presented after a syncope episode. She was found on her recliner not responsive for 15 minutes, EMS was called, she slowly recovered her consciousness and she was transported to the ED.  ? ?This NP visited patient at the bedside as a follow up for palliative medicine needs and emotional support, patient's sister/twin  Jasmine [name] is at the bedside ?  ?Today is day 11 of this hospital stay ?  ?Significant current medical situation for moderate pulmonary hypertension, severely elevated left-sided filling pressures, moderate to severe reduced cardiac output, likely occluded distal RIJ.  ?  ?Patient remains weak, dyspneic on minimal exertion. Creatine today 3.18, Na 129.   ?  ?Patient and family face treatment option decisions, advanced directive decisions and anticipatory care needs. ? ?According to several progress notes conversation were had today with patient regarding CODE STATUS and goals of care into the future and recommendation for hospice services, I was following up to continue that conversation and attempt to document patient's wishes and advance care planning. ?  ?Education offered on patient's current medical situation and the likely trajectory of serious heart disease with multiple comorbidities in the elderly patient.. ?   ?Education offered today regarding advanced directives.  CODE STATUS discussed specifically today.   ? ?Education offered on the difference between aggressive medical intervention path and a palliative comfort path for this patient at this time in this situation was detailed. ? ?Education offered on hospice benefit; philosophy and eligibility. ? ?Both patient and her sister are extremely reluctant to even discuss documentation of any advance care planning.  They again today tell me that they want her to come home and that they are large supportive family will be there to support the patient at home. ? ? ?As far as CODE STATUS-Patient remains a full code ? ?Educated patient/family to consider DNR/DNI status understanding evidenced based poor outcomes in similar hospitalized patient, as the cause of arrest is likely associated with advanced chronic illness rather than an easily reversible acute cardio-pulmonary event.  ? ?Patient was unable to make any shift/de-escalation in her treatment plan.  At this time she is open to all offered and available medical interventions to prolong life. ? ?Discussed with patient the importance of continued conversation with family and their  medical providers regarding overall plan of care and treatment options,  ensuring decisions are within the context of the patients values and GOCs. ? ?Patient is high risk for decompensation ? ?Questions and concerns addressed   Discussed with nursing team and Dr. Broadus John and transition of care to team via secure chat ? ? ?This nurse practitioner informed  the patient/family that I will be out of the hospital until Monday morning.  If the patient is still hospitalized I will follow-up at that time. ? ?Call palliative medicine team phone # [phone] with questions  or concerns in the interim ? ? ?Wadie Lessen NP  ?Palliative Medicine Team Team Phone # [phone] ?Pager [phone] ?  ?

## 2022-04-04 NOTE — Progress Notes (Signed)
Occupational Therapy Treatment ?Patient Details ?Name: Jasmine [name] ?MRN: [id number] ?DOB: 19-Jan-1934 ?Today's Date: 04/04/2022 ? ? ?History of present illness 88 yo admitted 4/15 after unconscious at home 15 min. Pt with acute on chronic HF with pulmonary HTN and severe RV failure. PMHx: CHF, AFib, HTN, DVT, vertigo, TIA, hypothyroidism ?  ?OT comments ? Pt with increased weakness and fatigue today. Moderate assistance for bed mobility, min assist for transfer with RW for use of BSC and set up for seated grooming. Pt attempting to sit prematurely with transfer. High risk for falls. Updated d/c recommendation to SNF. Pt with decreased awareness of deficits and level of care she requires.   ? ?Recommendations for follow up therapy are one component of a multi-disciplinary discharge planning process, led by the attending physician.  Recommendations may be updated based on patient status, additional functional criteria and insurance authorization. ?   ?Follow Up Recommendations ? Skilled nursing-short term rehab (<3 hours/day)  ?  ?Assistance Recommended at Discharge Frequent or constant Supervision/Assistance  ?Patient can return home with the following ? A little help with walking and/or transfers;Assistance with cooking/housework;Assist for transportation;Help with stairs or ramp for entrance;A lot of help with bathing/dressing/bathroom;Direct supervision/assist for medications management;Direct supervision/assist for financial management ?  ?Equipment Recommendations ? Other (comment) (defer to next venue)  ?  ?Recommendations for Other Services   ? ?  ?Precautions / Restrictions Precautions ?Precautions: Fall;Other (comment) ?Precaution Comments: watch HR, incontinent  ? ? ?  ? ?Mobility Bed Mobility ?Overal bed mobility: Needs Assistance ?Bed Mobility: Supine to Sit, Sit to Supine ?  ?  ?Supine to sit: Mod assist, HOB elevated ?Sit to supine: Mod assist ?  ?General bed mobility comments: assist for LEs over EOB  and to raise trunk with HOB up, assist for LEs back into bed ?  ? ?Transfers ?Overall transfer level: Needs assistance ?Equipment used: Rolling walker (2 wheels) ?Transfers: Sit to/from Stand, Bed to chair/wheelchair/BSC ?Sit to Stand: Min assist ?Stand pivot transfers: Min assist ?  ?  ?  ?  ?General transfer comment: min assist from elevated bed and BSC to stand and pivot, pt with tendency to sit prior to being aligned with sitting surface ?  ?  ?Balance Overall balance assessment: Needs assistance ?  ?Sitting balance-Leahy Scale: Fair ?  ?  ?Standing balance support: Bilateral upper extremity supported ?Standing balance-Leahy Scale: Poor ?Standing balance comment: min assist and RW with posterior bias from elevated surfaces ?  ?  ?  ?  ?  ?  ?  ?  ?  ?  ?  ?   ? ?ADL either performed or assessed with clinical judgement  ? ?ADL Overall ADL's : Needs assistance/impaired ?  ?  ?Grooming: Wash/dry hands;Sitting;Set up ?  ?  ?  ?  ?  ?  ?  ?  ?  ?Toilet Transfer: Minimal assistance;Rolling walker (2 wheels);Stand-pivot;BSC/3in1 ?  ?Toileting- Clothing Manipulation and Hygiene: Minimal assistance;Sit to/from stand;Sitting/lateral lean ?  ?  ?  ?  ?  ?  ? ?Extremity/Trunk Assessment   ?  ?  ?  ?  ?  ? ?Vision   ?  ?  ?Perception   ?  ?Praxis   ?  ? ?Cognition Arousal/Alertness: Awake/alert ?Behavior During Therapy: [facility] for tasks assessed/performed ?Overall Cognitive Status: Impaired/Different from baseline ?Area of Impairment: Memory, Safety/judgement ?  ?  ?  ?  ?  ?  ?  ?  ?  ?  ?Memory: Decreased short-term memory ?  ?  Safety/Judgement: Decreased awareness of safety, Decreased awareness of deficits ?  ?  ?General Comments: decreased awareness of level of care she requires for a home discharge ?  ?  ?   ?Exercises   ? ?  ?Shoulder Instructions   ? ? ?  ?General Comments    ? ? ?Pertinent Vitals/ Pain       Pain Assessment ?Pain Assessment: No/denies pain ? ?Home Living   ?  ?  ?  ?  ?  ?  ?  ?  ?  ?  ?  ?  ?  ?  ?  ?   ?  ?  ? ?  ?Prior Functioning/Environment    ?  ?  ?  ?   ? ?Frequency ? Min 2X/week  ? ? ? ? ?  ?Progress Toward Goals ? ?OT Goals(current goals can now be found in the care plan section) ? Progress towards OT goals: Not progressing toward goals - comment (fatigued, weakness) ? ?Acute Rehab OT Goals ?OT Goal Formulation: With patient ?Time For Goal Achievement: 04/14/22 ?Potential to Achieve Goals: Good  ?Plan Discharge plan needs to be updated;Frequency needs to be updated   ? ?Co-evaluation ? ? ?   ?  ?  ?  ?  ? ?  ?AM-PAC OT "6 Clicks" Daily Activity     ?Outcome Measure ? ? Help from another person eating meals?: None ?Help from another person taking care of personal grooming?: A Little ?Help from another person toileting, which includes using toliet, bedpan, or urinal?: A Little ?Help from another person bathing (including washing, rinsing, drying)?: A Lot ?Help from another person to put on and taking off regular upper body clothing?: A Little ?Help from another person to put on and taking off regular lower body clothing?: A Lot ?6 Click Score: 17 ? ?  ?End of Session Equipment Utilized During Treatment: Gait belt;Rolling walker (2 wheels) ? ?OT Visit Diagnosis: Unsteadiness on feet (R26.81);Other abnormalities of gait and mobility (R26.89);Muscle weakness (generalized) (M62.81) ?  ?Activity Tolerance Patient limited by fatigue ?  ?Patient Left in bed;with call bell/phone within reach;with bed alarm set ?  ?Nurse Communication   ?  ? ?   ? ?Time: 2707-8675 ?OT Time Calculation (min): 16 min ? ?Charges: OT General Charges ?$OT Visit: 1 Visit ?OT Treatments ?$Self Care/Home Management : 8-22 mins ? ?Nestor Lewandowsky, OTR/L ?Acute Rehabilitation Services ?Pager: [phone] ?Office: [phone]  ? ?[name] ?04/04/2022, 2:33 PM ?

## 2022-04-04 NOTE — Progress Notes (Addendum)
Patient ID: Jasmine [name], female   DOB: Apr 08, 1934, 88 y.o.   MRN: [id number] ?  ? ? Advanced Heart Failure Rounding Note ? ?PCP-Cardiologist: Sinclair Grooms, MD  ? ?Subjective:   ? ?[place] 4/20  ?RA = 14 (Marked v-waves) ?RV = 48/14 ?PA = 61/24 (42) ?PCW = 29 ?Fick cardiac output/index = 3.6/2.0 ?Thermo CO/CI = 3.1/1.7 ?PVR = 4.5 WU ?Ao sat = 98% ?PA sat = 50%,54% ?SVC sat = 58%  ?Assessment: ?1. Moderate mixed pulmonary HTN ?2. Severely elevated left-sided filling pressures ?3. Moderately to severely reduced CO ?4. Probably occluded distal RIJ ? ?Started on amio for A Fib RVR/PVCs. Post cath started on IV lasix + milrinone. ? ?4/23 Diuretics held d/t worsening renal function.  ?4/24 Milrinone increased to 0.25 mcg. Diuresed with high dose IV lasix. Continued on amio drip.  ?4/25 Amio gtt d/c due to IV infiltrate. Milrinone reduced to 0.125  ? ?Remains on milrinone 0.125 mcg  through PIV.  ? ?Creatinine 2.4>2.6>3.2 ? ?Complaining of fatigue.  ? ? ?Objective:   ?Weight Range: ?67 kg ?Body mass index is 25.35 kg/m?.  ? ?Vital Signs:   ?Temp:  [97.4 ?F (36.3 ?C)-97.6 ?F (36.4 ?C)] 97.5 ?F (36.4 ?C) (04/27 0901) ?Pulse Rate:  [94-132] 115 (04/27 0929) ?Resp:  [22-33] 25 (04/27 0901) ?BP: (124-134)/(73-106) 127/82 (04/27 0901) ?SpO2:  [92 %-98 %] 93 % (04/27 0929) ?Last BM Date : 03/30/22 ? ?Weight change: ?Filed Weights  ? 04/01/22 0416 04/02/22 0504 04/03/22 0507  ?Weight: 68.7 kg 64.3 kg 67 kg  ? ? ?Intake/Output:  ? ?Intake/Output Summary (Last 24 hours) at 04/04/2022 1216 ?Last data filed at 04/04/2022 1000 ?Gross per 24 hour  ?Intake 775.43 ml  ?Output 1000 ml  ?Net -224.57 ml  ?  ? ? ?Physical Exam  ? General:  Appears weak. No resp difficulty ?HEENT: normal ?Neck: supple. JVP 10-11. Carotids 2+ bilat; no bruits. No lymphadenopathy or thryomegaly appreciated. ?Cor: PMI nondisplaced. Tachy irregular rate & rhythm. No rubs, gallops. 2/6 TR. ?Lungs: clear ?Abdomen: soft, nontender, nondistended. No  hepatosplenomegaly. No bruits or masses. Good bowel sounds. ?Extremities: no cyanosis, clubbing, rash, edema. L forearm ecchymotic indurated from IV infiltrate.  ?Neuro: alert & orientedx3, cranial nerves grossly intact. moves all 4 extremities w/o difficulty. Affect flat  ? ?TELEMETRY  ? ?A fib 110-120s with occasional PVCs.  ? ?Labs  ?  ?CBC ?Recent Labs  ?  04/04/22 ?0311  ?WBC 6.3  ?HGB 12.4  ?HCT 37.8  ?MCV 97.7  ?PLT 184  ? ? ?Basic Metabolic Panel ?Recent Labs  ?  04/01/22 ?2139 04/02/22 ?0310 04/03/22 ?0353 04/04/22 ?0311  ?NA 129* 126* 126* 129*  ?K 4.6 4.4 5.2* 5.0  ?CL 93* 91* 90* 91*  ?CO2 21* 22 22 20*  ?GLUCOSE 168* 149* 145* 224*  ?BUN 63* 60* 64* 70*  ?CREATININE 2.55* 2.36* 2.64* 3.18*  ?CALCIUM 8.5* 8.6* 8.8* 9.2  ?MG 1.9 2.3  --   --   ? ?Liver Function Tests ?Recent Labs  ?  04/04/22 ?0311  ?AST 39  ?ALT 22  ?ALKPHOS 151*  ?BILITOT 4.3*  ?PROT 7.0  ?ALBUMIN 2.8*  ? ? ?No results for input(s): LIPASE, AMYLASE in the last 72 hours. ?Cardiac Enzymes ?No results for input(s): CKTOTAL, CKMB, CKMBINDEX, TROPONINI in the last 72 hours. ? ?BNP: ?BNP (last 3 results) ?Recent Labs  ?  03/14/22 ?1502 03/13/2022 ?2126  ?BNP 1,424.0* 1,555.0*  ? ? ?ProBNP (last 3 results) ?No results for input(s):  PROBNP in the last 8760 hours. ? ? ?D-Dimer ?No results for input(s): DDIMER in the last 72 hours. ?Hemoglobin A1C ?No results for input(s): HGBA1C in the last 72 hours. ?Fasting Lipid Panel ?No results for input(s): CHOL, HDL, LDLCALC, TRIG, CHOLHDL, LDLDIRECT in the last 72 hours. ?Thyroid Function Tests ?No results for input(s): TSH, T4TOTAL, T3FREE, THYROIDAB in the last 72 hours. ? ?Invalid input(s): FREET3 ? ?Other results: ? ? ?Imaging  ? ? ?No results found. ? ? ?Medications:   ? ? ?Scheduled Medications: ? amiodarone  400 mg Oral BID  ? apixaban  2.5 mg Oral BID  ? docusate sodium  100 mg Oral BID  ? levothyroxine  25 mcg Oral Q0600  ? mexiletine  150 mg Oral Q12H  ? pantoprazole  40 mg Oral Daily  ? sodium  chloride flush  3 mL Intravenous Q12H  ? torsemide  60 mg Oral Daily  ? ? ?Infusions: ? sodium chloride 10 mL (03/25/22 0222)  ? sodium chloride    ? milrinone 0.125 mcg/kg/min (04/03/22 1703)  ? ? ?PRN Medications: ?sodium chloride, sodium chloride, acetaminophen, HYDROmorphone (DILAUDID) injection, nitroGLYCERIN, ondansetron (ZOFRAN) IV, polyethylene glycol, sodium chloride flush ? ? ? ?Patient Profile  ? ?  ?88 y/o woman with chronic AF, CKD IV and severe RV failure admitted with volume overload and recent syncope. Has been struggling with fluid management which has been c/b CKD.  ? ?Assessment/Plan  ? ?Syncope: ?-Felt to be likely d/t preload dependency in setting of RV failure and possible low-output ?-On atenolol 100 mg daily at home - held on admit (HR 50s).  ?-No significant pauses.  ?  ?2. HFpEF with RV failure: ?-Echo: EF 55-60%, ventricular septum flattened in systolic and diastole c/w RV pressure and volume overload, RV moderately reduced, RV severely enlarged, severe BAE, mild MR, severe TR, RVSP 45 mmHg ?-BNP 1,555 ?-? Low output. Lactic acid 1.3 on admit.  ?- RHC with elevated filling pressures. RA 14, PCWP  29, CO 3.6, CI 1.78 ?- Stop milrinone  ?- Volume status ok. Continue torsemide 60 mg daily today  ?- Off spironolactone with elevated creatinine.  ?- Renal function worse 1.86 => 2.16 => 2.53=>2.55=>2.36=>2.64=>3.18 ?  ?3. Severe Tricuspid Regurgitation: ?- Noted on echo this admit ?  ?4. Permanent atrial fibrillation: ?-continue PO amiodarone for rate control  ?-continue Eliquis 2.5 mg BID (appropriate dose given age and renal function). ?  ?5. Hypothyroidism: ?-On levothyroxine ?-TSH 5.8 ?  ?6. PVCs/NSVT: ?-likely d/t dilated RV ?-Keep K > 4 and Mag > 2. High PVC burden with NSVT.  ?-Continue amio 400 mg twice a day  + Mexiletine 150 mg bid.  ? ?7. CKD Stage IIIb ?-Creatinine 2>1.7 > 1.9 > 1.86 > 2.16>2.5>2.36>2.64  >3.16  ?-Cardiorenal in setting of end staged RV failure  ?-Would not be a  candidate for HD ? ?8. IV Infiltrate, LUE ?- IV infiltrated. Amio gtt discontinued  ? ?9. Hyponatremia ?- Na 129 ?- hypervolemic hyponatremia, end-stage HF  ? ?10. Constipation  ?- bowel regimen ordered by primary team  ? ?She has end-stage diastolic HF with severe RV involvement and worsening renal failure and marked fluid overload. Not a candidate for advanced HF therapies nor HD. Plan is for transition back home w/ palliative care services.  ? ?She would benefit from Hospice.  ? ?Length of Stay: 11 ? ?Darrick Grinder, NP  ?04/04/2022, 12:16 PM ? ?Advanced Heart Failure Team ?Pager [phone] (M-F; 7a - 5p)  ?Please contact [facility]  for night-coverage after hours (5p -7a ) and weekends on [email or web address] ? ?Patient seen and examined with the above-signed Advanced Practice Provider and/or Housestaff. I personally reviewed laboratory data, imaging studies and relevant notes. I independently examined the patient and formulated the important aspects of the plan. I have edited the note to reflect any of my changes or salient points. I have personally discussed the plan with the patient and/or family. ? ?She continues to deteriorate with low output RV failure. Scr getting worse. Remains in AF with RVR. Feels weak. SOB ? ?Seen by Dr. Tamala Julian (her primary cardiologist)  this am who discussed her situation with her and potential need for Hospice.  ? ?General:  Weak appearing. No resp difficulty ?HEENT: normal ?Neck: supple.JVP to ear Carotids 2+ bilat; no bruits. No lymphadenopathy or thryomegaly appreciated. ?Cor: PMI nondisplaced. Irregular tachy 2/6 TR ?Lungs: clear ?Abdomen: soft, nontender, nondistended. No hepatosplenomegaly. No bruits or masses. Good bowel sounds. ?Extremities: no cyanosis, clubbing, rash, 1+ edema LUE IV infiltrate  ?Neuro: alert & orientedx3, cranial nerves grossly intact. moves all 4 extremities w/o difficulty. Affect pleasant ? ?Long talk with her about poor prognosis and about how worried I am that she  is actively dying. Despite her talk with Dr. Tamala Julian this am she remains resistant to accepting her prognosis or wanting to switch to a more comfort care pathway. Will continue milrinone support for now. Continue to engage wit

## 2022-04-04 NOTE — Progress Notes (Signed)
Physical Therapy Treatment ?Patient Details ?Name: Jasmine [name] ?MRN: [id number] ?DOB: 07-26-1934 ?Today's Date: 04/04/2022 ? ? ?History of Present Illness 88 yo admitted 4/15 after unconscious at home 15 min. Pt with acute on chronic HF with pulmonary HTN and severe RV failure. PMHx: CHF, AFib, HTN, DVT, vertigo, TIA, hypothyroidism ? ?  ?PT Comments  ? ? Pt with significant decline in physical function this session. Decreased gait speed, distance and increased assist required for all mobility with rapid fatigue with gait. Pt lives with sister and continues to state she can assist but I doubt sister can physically assist at a mod assist level. Pt with posterior lean with all standing and right pelvis rotation. Pt lacks awareness of deficits and safety and recommend change to SNF.  ? ?HR 110-135 with gait ?111 at rest end of session ?SpO2 93-95% on RA ?   ?Recommendations for follow up therapy are one component of a multi-disciplinary discharge planning process, led by the attending physician.  Recommendations may be updated based on patient status, additional functional criteria and insurance authorization. ? ?Follow Up Recommendations ? Skilled nursing-short term rehab (<3 hours/day) ?  ?  ?Assistance Recommended at Discharge Frequent or constant Supervision/Assistance  ?Patient can return home with the following A lot of help with walking and/or transfers;A lot of help with bathing/dressing/bathroom;Assistance with cooking/housework;Direct supervision/assist for medications management;Direct supervision/assist for financial management;Assist for transportation;Help with stairs or ramp for entrance ?  ?Equipment Recommendations ? BSC/3in1  ?  ?Recommendations for Other Services   ? ? ?  ?Precautions / Restrictions Precautions ?Precautions: Fall;Other (comment) ?Precaution Comments: watch HR, incontinent  ?  ? ?Mobility ? Bed Mobility ?Overal bed mobility: Needs Assistance ?Bed Mobility: Supine to Sit ?  ?   ?Supine to sit: Mod assist, HOB elevated ?  ?  ?General bed mobility comments: HOB 25 degrees with mod assist to bring legs to EOB and elevate trunk with increased time and cues ?  ? ?Transfers ?Overall transfer level: Needs assistance ?  ?Transfers: Sit to/from Stand, Bed to chair/wheelchair/BSC ?Sit to Stand: Min assist ?Stand pivot transfers: Mod assist ?  ?  ?  ?  ?General transfer comment: min assist to stand from bed, mod assist to stand from chair. Pivot from chair to bed with face to face technique, knees blocked and mod assist ?  ? ?Ambulation/Gait ?Ambulation/Gait assistance: Mod assist ?Gait Distance (Feet): 40 Feet ?Assistive device: Rolling walker (2 wheels) ?Gait Pattern/deviations: Step-to pattern, Decreased stride length, Trunk flexed ?Gait velocity: 10 ft/90 sec= .11 ?  ?  ?General Gait Details: pt maintaining rt pelvic rotation in relation to trunk with continued right veering and posterior bias with mod assist to advance gait with extremely slow speed and decreased awareness. pt with rapid fatigue this session requiring chair pulled to her at 23' and returned to bed with significant difficulty sitting in chair and sequencing additional rise ? ? ?Stairs ?  ?  ?  ?  ?  ? ? ?Wheelchair Mobility ?  ? ?Modified Rankin (Stroke Patients Only) ?  ? ? ?  ?Balance Overall balance assessment: Needs assistance ?Sitting-balance support: No upper extremity supported, Feet supported ?Sitting balance-Leahy Scale: Fair ?Sitting balance - Comments: static sitting without support ?  ?Standing balance support: Bilateral upper extremity supported ?Standing balance-Leahy Scale: Poor ?Standing balance comment: reliang on RW and external support to maintain standing balance with posterior lean ?  ?  ?  ?  ?  ?  ?  ?  ?  ?  ?  ?  ? ?  ?  Cognition Arousal/Alertness: Awake/alert ?Behavior During Therapy: [facility] for tasks assessed/performed ?Overall Cognitive Status: Impaired/Different from baseline ?Area of Impairment: Memory,  Safety/judgement ?  ?  ?  ?  ?  ?  ?  ?  ?  ?  ?Memory: Decreased short-term memory ?  ?Safety/Judgement: Decreased awareness of deficits, Decreased awareness of safety ?  ?  ?General Comments: pt continues to state sister can care for her but significant decline in physical function from prior visits. Pt unable to self monitor and regulate balance and fatigue. Tangential ?  ?  ? ?  ?Exercises   ? ?  ?General Comments   ?  ?  ? ?Pertinent Vitals/Pain Pain Assessment ?Pain Assessment: No/denies pain  ? ? ?Home Living   ?  ?  ?  ?  ?  ?  ?  ?  ?  ?   ?  ?Prior Function    ?  ?  ?   ? ?PT Goals (current goals can now be found in the care plan section) Progress towards PT goals: Not progressing toward goals - comment ? ?  ?Frequency ? ? ? Min 3X/week ? ? ? ?  ?PT Plan Discharge plan needs to be updated  ? ? ?Co-evaluation   ?  ?  ?  ?  ? ?  ?AM-PAC PT "6 Clicks" Mobility   ?Outcome Measure ? Help needed turning from your back to your side while in a flat bed without using bedrails?: A Little ?Help needed moving from lying on your back to sitting on the side of a flat bed without using bedrails?: A Lot ?Help needed moving to and from a bed to a chair (including a wheelchair)?: A Lot ?Help needed standing up from a chair using your arms (e.g., wheelchair or bedside chair)?: A Lot ?Help needed to walk in hospital room?: A Lot ?Help needed climbing 3-5 steps with a railing? : Total ?6 Click Score: 12 ? ?  ?End of Session   ?Activity Tolerance: Patient limited by fatigue ?Patient left: in bed;with call bell/phone within reach;with bed alarm set ?Nurse Communication: Mobility status ?PT Visit Diagnosis: Other abnormalities of gait and mobility (R26.89);Difficulty in walking, not elsewhere classified (R26.2);Muscle weakness (generalized) (M62.81);History of falling (Z91.81) ?  ? ? ?Time: 5188-4166 ?PT Time Calculation (min) (ACUTE ONLY): 35 min ? ?Charges:  $Gait Training: 8-22 mins ?$Therapeutic Activity: 8-22 mins           ?          ? ?Nick Stults P, PT ?Acute Rehabilitation Services ?Pager: [phone] ?Office: [phone] ? ? ? ?Leonidas Boateng B Ruey Storer ?04/04/2022, 9:36 AM ? ?

## 2022-04-04 NOTE — TOC Progression Note (Addendum)
Transition of Care (TOC) - Progression Note  ? ? ?Patient Details  ?Name: Jasmine [name] ?MRN: [id number] ?Date of Birth: Jun 06, 1934 ? ?Transition of Care (TOC) CM/SW Contact  ?Victormanuel Mclure, LCSW ?Phone Number: ?04/04/2022, 4:00 PM ? ?Clinical Narrative:    ?HF CSW received consult for possible SNF placement at time of discharge. CSW spoke with patient and her sister, [name] at bedside. Patient reported that patient's sister is currently unable to care for patient at their home given patient?s current physical needs and fall risk. Patient expressed understanding of PT recommendation and is possibly agreeable to SNF placement at time of discharge. Patient reports preference for maybe [facility] CSW discussed insurance authorization process and provided Medicare SNF ratings list. Patient has received the COVID vaccines. CSW will send out referrals for review. Patient expressed being hopeful for rehab and to feel better soon. No further questions reported at this time.  ? ?Skilled Nursing Rehab Facilities-   [email or web address]   Ratings out of 5 possible   ?Name Address  Phone # Quality Care Staffing Health Inspection Overall  ?[facility] [address], [place] 4 5 2 3   ?[place], [place] [phone] 3 2 5 5   ?[facility] [place], [place] 3 1 1 1   ?[place], [place] 3 2 4 4   ?[facility] [address], [place] 1 1 2 1   ?[place] [phone] 2 1 4 3   ?[place] 5 2 3 4   ?[facility] [address] [place] 5 2 2 3   ?[facility] [place] [phone] 5 1 2 2   ?[facility] [address], [place] [phone] 4 1 2 1   ?[facility] [address], [place]  [phone] 4 1 2 1   ?[facility] [place], [place] [phone] 4 1 1 1   ?[facility] [address] [place] [phone] 3 2 4 4   ?        ?[place], [place]      ?[facility] [place] 4 2 3 3   ?[facility] [place], [place] 4 1 5 4   ?[place], [place] [address] [place], [place] [phone] 2 1 1 1   ?[facility] [address], [place] [phone] 2 1 3 2   ?        ?[address] (no [facility]) [address], [place] [phone] 4 5 5 5   ?[facility] (No [employer]) [address], [place] 3 1 4 3   ?[facility] (No [employer]) [address] [place] [phone] 5 5 5 5   ?[facility] [address], [place] [phone] 3 2 4 4   ?[facility] [address], [place] [phone] 1 1 2 1   ?[place] [address] [place] [phone] 2 1 1 1   ?[facility] [place] 5 2 4 5   ?[facility] [address] [place] 3 1 1 1   ?[facility] [place], [place] 2 1 2 1   ?        ?[facility] [place] 1 1 1 1   ?[place] [facility] [address], [place]  [phone] 2 4 2 2   ?[facility] [place] [address],  [place] [phone] 5 2 3 4   ?[place] [address], [place] 2 1 1 1   ?[place] (No [employer]) [address] [place] 2 1 3 2   ?[facility] [address], [place] [phone] 3 1 1 1   ?        ?[facility] [place], [place] 5 4 5 5   ?[facility])  [address], [place] 2 2 3 3   ?[facility]) [place], [place] 3 2 4 4   ?[facility] [address] [place] 4 3 4 4   ?[address] [place], [place] 3 3 1 1   ?[place] [facility]) [place], [place] 2 2 4 4   ?  ? ? ?Expected Discharge Plan: [place] ?Barriers to Discharge: Continued Medical Work up ? ?Expected Discharge Plan and Services ?Expected Discharge Plan: [place] ?In-house Referral: Clinical Social Work ?Discharge Planning Services: CM Consult ?Post Acute Care Choice: Home Health ?Living arrangements for the past 2 months: [place] ?                ?DME Arranged: 3-N-1 ?DME Agency: [employer] ?Date DME Agency Contacted: 04/02/22 ?Time DME Agency Contacted: 1100 ?  ?HH Arranged: RN, PT, Nurse's Aide ?[place] Agency: [place] ?Date HH Agency Contacted: 04/02/22 ?Time [place]: 1101 ?  ? ? ?Social Determinants of Health (SDOH) Interventions ?  ? ?Readmission Risk Interventions ?   ? View : No data to display.  ?  ?  ?  ? ?[place], MSW, LCSW ?[phone] ?Heart Failure Social Worker  ?

## 2022-04-04 NOTE — Progress Notes (Signed)
I am her primary cardiologist. ?I had a long conversation with her this morning.  I discussed palliative care and goals of care planning and decision making to relieve the family of having to make those decisions for her.  She is amenable to a palliative care conversation and I recommended hospice for which she did not forcefully push back on.  We should proceed with palliative care followed by offering of hospice.  She does not want to be placed on artificial life support. ?

## 2022-04-04 NOTE — Progress Notes (Signed)
?PROGRESS NOTE ? ? ? ?Jasmine [name]  PJA:[id number] DOB: 1934-03-04 DOA: 04/07/2022 ?PCP: [name], MD  ?87/F with history of chronic diastolic CHF, CKD 4, hypertension, atrial fibrillation, hypothyroidism, vertigo, DVT admitted with end-stage diastolic CHF, severe RV failure ?-Right heart cath noted elevated filling pressures, mixed pulmonary hypertension, moderate to severely reduced cardiac output ?-Started on milrinone and IV Lasix ?-Seen by palliative care this admission, wishes to remain full code with full scope of treatment ?-Seen by Dr. Tamala Julian 4/27, discussed poor prognosis and recommended palliative care, hospice ? ?Subjective: ?-Feels okay overall, has tangential thoughts and hard to get her to focus on active issues ? ?Assessment and Plan: ? ?Acute on chronic diastolic CHF (congestive heart failure) ([place]) ?End-stage diastolic CHF ?Pulmonary hypertension with severe RV failure, severe TR ?-Echo with EF of 55 to 60%, RV pressure and volume overload. Moderate reduction in RV systolic dysfunction, RV cavity with severe enlargement, severe TR ?-Right heart cath 4/20 with significantly elevated filling pressures, mixed pulmonary hypertension, moderate to severely reduced cardiac output ?-Diuresed with IV Lasix, she is 14.9 L negative, milrinone discontinued ?-Creatinine up to 3.18 today ?-GDMT limited by AKI/CKD ?-Appreciate input from Dr. Tamala Julian assisting with goals of care, will request palliative to reengage ? ?Permanent atrial fibrillation ([place]) ?-amio changed to Po ?-Continue Eliquis ? ?Stage 4 chronic kidney disease ([place]) ?Hyponatremia, non anion gap metabolic acidosis.  ?-Fluctuation in creatinine noted, baseline creatinine around 1.6, now 3.1, cardiorenal in etiology ? ?Syncope ?Multifactorial, patient with RV failure and pulmonary hypertension. ?Likely secondary to end stage RV failure.  ?-Also had EEG this admission, negative for seizures ? ?Chronic venous stasis ?-Was briefly on antibiotics, now  discontinued ? ?Hypothyroidism ?Continue with levothyroxine, TSH was mildly elevated, needs to be repeated in 6 weeks ? ?DVT prophylaxis: Apixaban ?Code Status: Full code ?Family Communication: no family at bedside, was unable to reach sister [name]  ?Disposition Plan: to be determined  ? ?Consultants:  ?Heart failure team ? ?Procedures:  ? ?Antimicrobials:  ? ? ?Objective: ?Vitals:  ? 04/04/22 0349 04/04/22 0901 04/04/22 0929 04/04/22 1001  ?BP: 124/73 127/82  (!) 118/94  ?Pulse: (!) 113 (!) 115 (!) 115 (!) 108  ?Resp: (!) 25 (!) 25  (!) 30  ?Temp: 97.6 ?F (36.4 ?C) (!) 97.5 ?F (36.4 ?C)    ?TempSrc: Axillary Oral    ?SpO2: 93% 94% 93% 94%  ?Weight:      ? ? ?Intake/Output Summary (Last 24 hours) at 04/04/2022 1430 ?Last data filed at 04/04/2022 1300 ?Gross per 24 hour  ?Intake 581.67 ml  ?Output --  ?Net 581.67 ml  ? ?Filed Weights  ? 04/01/22 0416 04/02/22 0504 04/03/22 0507  ?Weight: 68.7 kg 64.3 kg 67 kg  ? ? ?Examination: ? ?General exam: Chronically ill elderly female sitting up in bed, AAO x2 ?HEENT: Positive JVD ?CVS: S1-S2, irregularly irregular rhythm ?Lungs: Clear bilaterally ?Abdomen: Soft, nontender, bowel sounds present ?Extremities: No edema ?Skin: No rashes ?Psychiatry:  Mood & affect appropriate.  ? ?Data Reviewed:  ? ?CBC: ?Recent Labs  ?Lab 03/09/2022 ?1507 03/19/2022 ?1513 04/04/22 ?0311  ?WBC  --   --  6.3  ?HGB 13.9  13.6 12.9 12.4  ?HCT 41.0  40.0 38.0 37.8  ?MCV  --   --  97.7  ?PLT  --   --  184  ? ?Basic Metabolic Panel: ?Recent Labs  ?Lab 03/29/22 ?1528 03/30/22 ?0251 03/31/22 ?0225 04/01/22 ?3419 04/01/22 ?2139 04/02/22 ?3790 04/03/22 ?2409 04/04/22 ?7353  ?NA  --    < >  131* 131* 129* 126* 126* 129*  ?K  --    < > 4.7 4.6 4.6 4.4 5.2* 5.0  ?CL  --    < > 96* 96* 93* 91* 90* 91*  ?CO2  --    < > 21* 23 21* 22 22 20*  ?GLUCOSE  --    < > 185* 126* 168* 149* 145* 224*  ?BUN  --    < > 60* 65* 63* 60* 64* 70*  ?CREATININE  --    < > 2.16* 2.53* 2.55* 2.36* 2.64* 3.18*  ?CALCIUM  --    < > 9.0  8.6* 8.5* 8.6* 8.8* 9.2  ?MG 2.1  --  2.0 1.9 1.9 2.3  --   --   ? < > = values in this interval not displayed.  ? ?GFR: ?Estimated Creatinine Clearance: 12.2 mL/min (A) (by C-G formula based on SCr of 3.18 mg/dL (H)). ?Liver Function Tests: ?Recent Labs  ?Lab 04/04/22 ?0311  ?AST 39  ?ALT 22  ?ALKPHOS 151*  ?BILITOT 4.3*  ?PROT 7.0  ?ALBUMIN 2.8*  ? ?No results for input(s): LIPASE, AMYLASE in the last 168 hours. ?No results for input(s): AMMONIA in the last 168 hours. ?Coagulation Profile: ?No results for input(s): INR, PROTIME in the last 168 hours. ?Cardiac Enzymes: ?No results for input(s): CKTOTAL, CKMB, CKMBINDEX, TROPONINI in the last 168 hours. ?BNP (last 3 results) ?No results for input(s): PROBNP in the last 8760 hours. ?HbA1C: ?No results for input(s): HGBA1C in the last 72 hours. ?CBG: ?No results for input(s): GLUCAP in the last 168 hours. ?Lipid Profile: ?No results for input(s): CHOL, HDL, LDLCALC, TRIG, CHOLHDL, LDLDIRECT in the last 72 hours. ?Thyroid Function Tests: ?No results for input(s): TSH, T4TOTAL, FREET4, T3FREE, THYROIDAB in the last 72 hours. ?Anemia Panel: ?No results for input(s): VITAMINB12, FOLATE, FERRITIN, TIBC, IRON, RETICCTPCT in the last 72 hours. ?Urine analysis: ?   ?Component Value Date/Time  ? COLORURINE AMBER (A) 03/24/2022 0328  ? APPEARANCEUR HAZY (A) 03/24/2022 0328  ? LABSPEC 1.024 03/24/2022 0328  ? PHURINE 5.0 03/24/2022 0328  ? GLUCOSEU NEGATIVE 03/24/2022 0328  ? [place] NEGATIVE 03/24/2022 0328  ? [place] NEGATIVE 03/24/2022 0328  ? [place] NEGATIVE 03/24/2022 0328  ? PROTEINUR 30 (A) 03/24/2022 0328  ? NITRITE NEGATIVE 03/24/2022 0328  ? LEUKOCYTESUR NEGATIVE 03/24/2022 0328  ? ?Sepsis Labs: ?@LABRCNTIP (procalcitonin:4,lacticidven:4) ? ?)No results found for this or any previous visit (from the past 240 hour(s)).  ? ?Radiology Studies: ?No results found. ? ? ?Scheduled Meds: ? amiodarone  400 mg Oral BID  ? apixaban  2.5 mg Oral BID  ? docusate sodium  100 mg  Oral BID  ? levothyroxine  25 mcg Oral Q0600  ? mexiletine  150 mg Oral Q12H  ? pantoprazole  40 mg Oral Daily  ? sodium chloride flush  3 mL Intravenous Q12H  ? torsemide  60 mg Oral Daily  ? ?Continuous Infusions: ? sodium chloride 10 mL (03/25/22 0222)  ? sodium chloride    ? ? ? LOS: 11 days  ? ? ?Time spent: 29min ? ?Domenic Polite, MD ?[facility] ? ? ?04/04/2022, 2:30 PM  ?  ?

## 2022-04-04 NOTE — Care Management Important Message (Signed)
Important Message ? ?Patient Details  ?Name: Jasmine [name] ?MRN: [id number] ?Date of Birth: 01-22-1934 ? ? ?Medicare Important Message Given:  Yes ? ? ? ? ?Shelda Altes ?04/04/2022, 10:44 AM ?

## 2022-04-04 NOTE — Consult Note (Signed)
WOC Nurse Consult Note: Reason for Consult: LFA IV infiltration  Wound type: tissue damage from ? Amiodarone infusion  Pressure Injury POA: NA Measurement:5cm x 7cm x 0 with intact serous filled blister at the distal aspect of the affected area Wound bed: dark purple area at IV infiltration site with blister Drainage (amount, consistency, odor) none Periwound:edema and pain  Dressing procedure/placement/frequency: Add single layer of xeroform gauze over blister and dry dressing.   Monitor for acute changes; evolution of tissue damage.   Discussed POC with patient and bedside nurse.  Re consult if needed, will not follow at this time. Thanks  Jesua Tamblyn [employer], RN,CWOCN, CNS, [place] [phone])

## 2022-04-04 NOTE — [place] FL2 (Signed)
?[place] MEDICAID FL2 LEVEL OF CARE SCREENING TOOL  ?  ? ?IDENTIFICATION  ?Patient Name: ?Jasmine [name] Birthdate: 08-09-1934 Sex: female Admission Date (Current Location): ?03/18/2022  ?[place] and [place] Number: ? [place] ?  Facility and Address:  ?[facility], [place] [address] [place], [place] [address] ?     Provider Number: ?[id number]  ?Attending Physician Name and Address:  ?Domenic Polite, MD ? Relative Name and Phone Number:  ?[place] Stell ?   ?Current Level of Care: ?Hospital Recommended Level of Care: ?[place] Prior Approval Number: ?  ? ?Date Approved/Denied: ?  PASRR Number: ?[id number] A ? ?Discharge Plan: ?SNF ?  ? ?Current Diagnoses: ?Patient Active Problem List  ? Diagnosis Date Noted  ? Hypothyroidism 03/27/2022  ? Cellulitis 03/24/2022  ? Syncope 03/16/2022  ? Stage 4 chronic kidney disease ([place]) 03/17/2022  ? Deviated septum 01/11/2020  ? Epistaxis 01/11/2020  ? CKD (chronic kidney disease) stage 3, GFR 30-59 ml/min (HCC) 04/19/2019  ? TIA (transient ischemic attack) 10/02/2018  ? Long term current use of anticoagulant therapy 02/08/2014  ? Acute on chronic diastolic CHF (congestive heart failure) ([place]) 11/21/2012  ? Insulin resistance 11/21/2012  ? Deep venous thrombosis of lower extremity ([place]) 06/02/2012  ? Hypertension 01/07/2012  ? Atrial fibrillation ([place]) 12/23/2011  ? ? ?Orientation RESPIRATION BLADDER Height & Weight   ?  ?Self, Time, Situation, Place ? Normal Continent, External catheter Weight: 147 lb 11.3 oz (67 kg) ?Height:     ?BEHAVIORAL SYMPTOMS/MOOD NEUROLOGICAL BOWEL NUTRITION STATUS  ?    Continent Diet (Please see DC Summary)  ?AMBULATORY STATUS COMMUNICATION OF NEEDS Skin   ?Limited Assist Verbally Normal ?  ?  ?  ?    ?     ?     ? ? ?Personal Care Assistance Level of Assistance  ?Bathing, Feeding, Dressing Bathing Assistance: Limited assistance ?Feeding assistance: Independent ?Dressing Assistance: Limited assistance ?    ? ?Functional Limitations Info  ?Sight, Hearing, Speech Sight Info: Impaired ?Hearing Info: Adequate ?Speech Info: Adequate  ? ? ?SPECIAL CARE FACTORS FREQUENCY  ?PT (By licensed PT), OT (By licensed OT)   ?  ?PT Frequency: 5x/week ?OT Frequency: 5x/week ?  ?  ?  ?   ? ? ?Contractures Contractures Info: Not present  ? ? ?Additional Factors Info  ?Code Status, Allergies Code Status Info: FULL ?Allergies Info: Lovenox (Enoxaparin), Norvasc (Amlodipine Besylate), Sulfa Antibiotics, Codeine, Latex ?  ?  ?  ?   ? ?Current Medications (04/04/2022):  This is the current hospital active medication list ?Current Facility-Administered Medications  ?Medication Dose Route Frequency Provider Last Rate Last Admin  ? 0.9 %  sodium chloride infusion   Intravenous PRN Bensimhon, Shaune Pascal, MD 10 mL/hr at 03/25/22 0222 10 mL at 03/25/22 0222  ? 0.9 %  sodium chloride infusion  250 mL Intravenous PRN Bensimhon, Shaune Pascal, MD      ? acetaminophen (TYLENOL) tablet 650 mg  650 mg Oral Q4H PRN Bensimhon, Shaune Pascal, MD   650 mg at 04/02/22 2121  ? amiodarone (PACERONE) tablet 400 mg  400 mg Oral BID Clegg, Amy D, NP   400 mg at 04/04/22 1002  ? apixaban (ELIQUIS) tablet 2.5 mg  2.5 mg Oral BID Bensimhon, Shaune Pascal, MD   2.5 mg at 04/04/22 1002  ? docusate sodium (COLACE) capsule 100 mg  100 mg Oral BID Howerter, Justin B, DO   100 mg at 04/04/22 1002  ? HYDROmorphone (DILAUDID)  injection 0.5 mg  0.5 mg Intravenous Q4H PRN Arrien, Jimmy Picket, MD   0.5 mg at 03/30/22 1036  ? levothyroxine (SYNTHROID) tablet 25 mcg  25 mcg Oral Q0600 Bensimhon, Shaune Pascal, MD   25 mcg at 04/04/22 1002  ? mexiletine (MEXITIL) capsule 150 mg  150 mg Oral Q12H Clegg, Amy D, NP   150 mg at 04/03/22 2250  ? nitroGLYCERIN (NITROSTAT) SL tablet 0.4 mg  0.4 mg Sublingual Q5 min PRN Arrien, Jimmy Picket, MD   0.4 mg at 03/29/22 1822  ? ondansetron (ZOFRAN) injection 4 mg  4 mg Intravenous Q6H PRN Bensimhon, Shaune Pascal, MD   4 mg at 04/03/22 1538  ? pantoprazole  (PROTONIX) EC tablet 40 mg  40 mg Oral Daily Arrien, Jimmy Picket, MD   40 mg at 04/04/22 1002  ? polyethylene glycol (MIRALAX / GLYCOLAX) packet 17 g  17 g Oral Daily PRN Howerter, Justin B, DO   17 g at 04/04/22 1505  ? sodium chloride flush (NS) 0.9 % injection 3 mL  3 mL Intravenous Q12H Bensimhon, Shaune Pascal, MD   3 mL at 04/03/22 2250  ? sodium chloride flush (NS) 0.9 % injection 3 mL  3 mL Intravenous PRN Bensimhon, Shaune Pascal, MD      ? torsemide (DEMADEX) tablet 60 mg  60 mg Oral Daily Clegg, Amy D, NP   60 mg at 04/04/22 1002  ? ? ? ?Discharge Medications: ?Please see discharge summary for a list of discharge medications. ? ?Relevant Imaging Results: ? ?Relevant Lab Results: ? ? ?Additional Information ?SSN: [id number] ? ?Taneisha Fuson, LCSW ? ? ? ? ?

## 2022-04-05 DIAGNOSIS — I5033 Acute on chronic diastolic (congestive) heart failure: Secondary | ICD-10-CM | POA: Diagnosis not present

## 2022-04-05 DIAGNOSIS — R0602 Shortness of breath: Secondary | ICD-10-CM | POA: Diagnosis not present

## 2022-04-05 DIAGNOSIS — Z7189 Other specified counseling: Secondary | ICD-10-CM | POA: Diagnosis not present

## 2022-04-05 DIAGNOSIS — N184 Chronic kidney disease, stage 4 (severe): Secondary | ICD-10-CM | POA: Diagnosis not present

## 2022-04-05 LAB — CBC
HCT: 39.3 % (ref 36.0–46.0)
Hemoglobin: 13.3 g/dL (ref 12.0–15.0)
MCH: 32.6 pg (ref 26.0–34.0)
MCHC: 33.8 g/dL (ref 30.0–36.0)
MCV: 96.3 fL (ref 80.0–100.0)
Platelets: 178 10*3/uL (ref 150–400)
RBC: 4.08 MIL/uL (ref 3.87–5.11)
RDW: 16.2 % — ABNORMAL HIGH (ref 11.5–15.5)
WBC: 6.5 10*3/uL (ref 4.0–10.5)
nRBC: 0.8 % — ABNORMAL HIGH (ref 0.0–0.2)

## 2022-04-05 LAB — BASIC METABOLIC PANEL
Anion gap: 18 — ABNORMAL HIGH (ref 5–15)
BUN: 89 mg/dL — ABNORMAL HIGH (ref 8–23)
CO2: 19 mmol/L — ABNORMAL LOW (ref 22–32)
Calcium: 9.4 mg/dL (ref 8.9–10.3)
Chloride: 92 mmol/L — ABNORMAL LOW (ref 98–111)
Creatinine, Ser: 3.7 mg/dL — ABNORMAL HIGH (ref 0.44–1.00)
GFR, Estimated: 11 mL/min — ABNORMAL LOW (ref >60)
Glucose, Bld: 148 mg/dL — ABNORMAL HIGH (ref 70–99)
Potassium: 5.4 mmol/L — ABNORMAL HIGH (ref 3.5–5.1)
Sodium: 129 mmol/L — ABNORMAL LOW (ref 135–145)

## 2022-04-05 MED ORDER — SODIUM ZIRCONIUM CYCLOSILICATE 5 G PO PACK
5.0000 g | PACK | Freq: Once | ORAL | Status: AC
  Administered 2022-04-05: 5 g via ORAL
  Filled 2022-04-05: qty 1

## 2022-04-05 MED ORDER — SODIUM CHLORIDE 0.9 % IV SOLN
0.5000 mg/h | INTRAVENOUS | Status: DC
  Administered 2022-04-05: 0.5 mg/h via INTRAVENOUS
  Filled 2022-04-05: qty 5

## 2022-04-05 NOTE — Progress Notes (Signed)
SNF offer received from pt/pt's sister preferred facility, [facility]). Updated pt's sister and she has accepted offer. [place] auth request submitted (ref # [id number]). [facility] is able to accept pt pending auth and medical clearance. MD updated. SW will continue to follow and assist as indicated.  ? ?Wandra Feinstein, MSW, LCSW ?[phone] (coverage) ? ? ?

## 2022-04-05 NOTE — Progress Notes (Addendum)
Patient ID: Jasmine [name], female   DOB: 04-13-1934, 88 y.o.   MRN: [id number] ?  ? ? Advanced Heart Failure Rounding Note ? ?PCP-Cardiologist: Sinclair Grooms, MD  ? ?Subjective:   ? ?Jasmine [name] 4/20  ?RA = 14 (Marked v-waves) ?RV = 48/14 ?PA = 61/24 (42) ?PCW = 29 ?Fick cardiac output/index = 3.6/2.0 ?Thermo CO/CI = 3.1/1.7 ?PVR = 4.5 WU ?Ao sat = 98% ?PA sat = 50%,54% ?SVC sat = 58%  ?Assessment: ?1. Moderate mixed pulmonary HTN ?2. Severely elevated left-sided filling pressures ?3. Moderately to severely reduced CO ?4. Probably occluded distal RIJ ? ?Started on amio for A Fib RVR/PVCs. Post cath started on IV lasix + milrinone. ? ?4/23 Diuretics held d/t worsening renal function.  ?4/24 Milrinone increased to 0.25 mcg. Diuresed with high dose IV lasix. Continued on amio drip.  ?4/25 Amio gtt d/c due to IV infiltrate. Milrinone reduced to 0.125  ?4/27 Milrinone discontinued  ? ?Continues to decline, markedly fatigued appearing w/ worsening renal fx. SCr 2.64>>3.18>>3.70. No respiratory difficulty.  Has been resistant to accepting her prognosis and declining hospice.  Palliative care following.  ? ? ? ?Objective:   ?Weight Range: ?65.7 kg ?Body mass index is 24.86 kg/m?.  ? ?Vital Signs:   ?Temp:  [97.4 ?F (36.3 ?C)-97.7 ?F (36.5 ?C)] 97.4 ?F (36.3 ?C) (04/28 0845) ?Pulse Rate:  [87-116] 87 (04/28 0845) ?Resp:  [20-26] 26 (04/28 0845) ?BP: (102-143)/(71-94) 115/92 (04/28 0845) ?SpO2:  [92 %-99 %] 99 % (04/28 0700) ?Weight:  [65.7 kg] 65.7 kg (04/28 0415) ?Last BM Date : 03/30/22 ? ?Weight change: ?Filed Weights  ? 04/02/22 0504 04/03/22 0507 04/05/22 0415  ?Weight: 64.3 kg 67 kg 65.7 kg  ? ? ?Intake/Output:  ? ?Intake/Output Summary (Last 24 hours) at 04/05/2022 1412 ?Last data filed at 04/04/2022 1700 ?Gross per 24 hour  ?Intake 600 ml  ?Output --  ?Net 600 ml  ?  ? ? ?Physical Exam  ?  ? ?General:  markedly fatigued appearing, lethargic but arousable. No respiratory difficulty ?HEENT: normal ?Neck: supple. JVD  elevated to jaw Carotids 2+ bilat; no bruits. No lymphadenopathy or thyromegaly appreciated. ?Cor: PMI nondisplaced. Irregularly irregular rhythm. 2/6 TR murmur  ?Lungs: clear ?Abdomen: soft, nontender, nondistended. No hepatosplenomegaly. No bruits or masses. Good bowel sounds. ?Extremities: no cyanosis, clubbing, rash, 1+ b/l edema, lt forearm ecchymotic and indurated from IV infiltrate ?Neuro: lethargic, moves all 4 extremities w/o difficulty. Affect pleasant. ? ? ?TELEMETRY  ? ?Afib 110s w/  with occasional PVCs.  ? ?Labs  ?  ?CBC ?Recent Labs  ?  04/04/22 ?0311 04/05/22 ?0401  ?WBC 6.3 6.5  ?HGB 12.4 13.3  ?HCT 37.8 39.3  ?MCV 97.7 96.3  ?PLT 184 178  ? ? ?Basic Metabolic Panel ?Recent Labs  ?  04/04/22 ?0311 04/05/22 ?0401  ?NA 129* 129*  ?K 5.0 5.4*  ?CL 91* 92*  ?CO2 20* 19*  ?GLUCOSE 224* 148*  ?BUN 70* 89*  ?CREATININE 3.18* 3.70*  ?CALCIUM 9.2 9.4  ? ?Liver Function Tests ?Recent Labs  ?  04/04/22 ?0311  ?AST 39  ?ALT 22  ?ALKPHOS 151*  ?BILITOT 4.3*  ?PROT 7.0  ?ALBUMIN 2.8*  ? ? ?No results for input(s): LIPASE, AMYLASE in the last 72 hours. ?Cardiac Enzymes ?No results for input(s): CKTOTAL, CKMB, CKMBINDEX, TROPONINI in the last 72 hours. ? ?BNP: ?BNP (last 3 results) ?Recent Labs  ?  03/14/22 ?1502 03/15/2022 ?2126  ?BNP 1,424.0* 1,555.0*  ? ? ?ProBNP (last 3  results) ?No results for input(s): PROBNP in the last 8760 hours. ? ? ?D-Dimer ?No results for input(s): DDIMER in the last 72 hours. ?Hemoglobin A1C ?No results for input(s): HGBA1C in the last 72 hours. ?Fasting Lipid Panel ?No results for input(s): CHOL, HDL, LDLCALC, TRIG, CHOLHDL, LDLDIRECT in the last 72 hours. ?Thyroid Function Tests ?No results for input(s): TSH, T4TOTAL, T3FREE, THYROIDAB in the last 72 hours. ? ?Invalid input(s): FREET3 ? ?Other results: ? ? ?Imaging  ? ? ?No results found. ? ? ?Medications:   ? ? ?Scheduled Medications: ? amiodarone  400 mg Oral BID  ? apixaban  2.5 mg Oral BID  ? docusate sodium  100 mg Oral BID  ?  levothyroxine  25 mcg Oral Q0600  ? mexiletine  150 mg Oral Q12H  ? pantoprazole  40 mg Oral Daily  ? sodium chloride flush  3 mL Intravenous Q12H  ? torsemide  60 mg Oral Daily  ? ? ?Infusions: ? sodium chloride 10 mL (03/25/22 0222)  ? sodium chloride    ? ? ?PRN Medications: ?sodium chloride, sodium chloride, acetaminophen, HYDROmorphone (DILAUDID) injection, nitroGLYCERIN, ondansetron (ZOFRAN) IV, polyethylene glycol, sodium chloride flush ? ? ? ?Patient Profile  ? ?  ?88 y/o woman with chronic AF, CKD IV and severe RV failure admitted with volume overload and recent syncope. Has been struggling with fluid management which has been c/b CKD.  ? ?Assessment/Plan  ? ?Syncope: ?-Felt to be likely d/t preload dependency in setting of RV failure and possible low-output ?-On atenolol 100 mg daily at home - held on admit (HR 50s).  ?-No significant pauses.  ?  ?2. HFpEF with RV failure: ?-Echo: EF 55-60%, ventricular septum flattened in systolic and diastole c/w RV pressure and volume overload, RV moderately reduced, RV severely enlarged, severe BAE, mild MR, severe TR, RVSP 45 mmHg ?-BNP 1,555 ?-Admitted w/ Low output. Lactic acid 1.3 on admit.  ?- RHC with elevated filling pressures. RA 14, PCWP  29, CO 3.6, CI 1.78 ?- failed to improve w/ milrinone. Discontinued 4/27 ?- remains fluid overloaded, no respiratory difficulty, predominant RV failure ?- Off spironolactone with elevated creatinine.  ?- Renal function worse 1.86 => 2.16 => 2.53=>2.55=>2.36=>2.64=>3.18=>3.70 ?- She is end-stage HF. Not candidate for advanced therapies given age. Recommend transition to hospice  ?  ?3. Severe Tricuspid Regurgitation: ?- Noted on echo this admit ?  ?4. Permanent atrial fibrillation: ?-continue PO amiodarone for rate control  ?-continue Eliquis 2.5 mg BID (appropriate dose given age and renal function). ?  ?5. Hypothyroidism: ?-On levothyroxine ?-TSH 5.8 ?  ?6. PVCs/NSVT: ?-likely d/t dilated RV ?-Keep K > 4 and Mag > 2. High  PVC burden with NSVT.  ?-Continue amio 400 mg twice a day  + Mexiletine 150 mg bid.  ? ?7. CKD Stage IIIb ?-Creatinine 2>1.7 > 1.9 > 1.86 > 2.16>2.5>2.36>2.64  >3.16>3.70 ?-Cardiorenal in setting of end staged RV failure  ?-Would not be a candidate for HD ? ?8. IV Infiltrate, LUE ?- IV infiltrated. Amio gtt discontinued  ? ?9. Hyponatremia ?- Na 129 ?- hypervolemic hyponatremia, end-stage HF  ? ?10. Constipation  ?- bowel regimen ordered by primary team  ? ?She has end-stage diastolic HF with severe RV involvement and worsening renal failure and marked fluid overload. Not a candidate for advanced HF therapies nor HD. Plan is for transition back home w/ palliative care services.  ? ?She would benefit from Hospice.  ? ?Length of Stay: 12 ? ?Lyda Jester, PA-C  ?04/05/2022,  2:12 PM ? ?Advanced Heart Failure Team ?Pager [phone] (M-F; 7a - 5p)  ?Please contact [facility] for night-coverage after hours (5p -7a ) and weekends on [email or web address] ? ?Patient seen and examined with the above-signed Advanced Practice Provider and/or Housestaff. I personally reviewed laboratory data, imaging studies and relevant notes. I independently examined the patient and formulated the important aspects of the plan. I have edited the note to reflect any of my changes or salient points. I have personally discussed the plan with the patient and/or family. ? ?Scr rising. Much weaker and more dyspneic today. Minimal urine output ? ?General:  Weak appearing. SOB with talking  ?HEENT: normal ?Neck: supple. JVP to jaw Carotids 2+ bilat; no bruits. No lymphadenopathy or thryomegaly appreciated. ?Cor: PMI nondisplaced. Irregular rate & rhythm. 2/6 TR ?Lungs: clear ?Abdomen: soft, nontender, nondistended. No hepatosplenomegaly. No bruits or masses. Good bowel sounds. ?Extremities: no cyanosis, clubbing, rash, 1+ edema ?Neuro: alert & orientedx3, cranial nerves grossly intact. moves all 4 extremities w/o difficulty. Affect pleasant ? ?She is  actively dying from end-stage HF and now renal failure. She is now DNR. I discussed with her and her sister and she is open to starting low-dose opioid drip for comfort. I have d/w Dr[facility] Broadus John [facility]

## 2022-04-05 NOTE — Progress Notes (Addendum)
?PROGRESS NOTE ? ? ? ?Jasmine [name]  QPY:[id number] DOB: 1934-04-20 DOA: 03/27/2022 ?PCP: [name], MD  ?87/F with history of chronic diastolic CHF, CKD 4, hypertension, atrial fibrillation, hypothyroidism, vertigo, DVT admitted with end-stage diastolic CHF, severe RV failure ?-Right heart cath noted elevated filling pressures, mixed pulmonary hypertension, moderate to severely reduced cardiac output ?-Started on milrinone and IV Lasix ?-Seen by palliative care x3 this admission, wishes to remain full code with full scope of treatment ?-Seen by Dr. Tamala Julian 4/27, discussed poor prognosis and recommended palliative care, hospice ?-Worsening kidney function ? ?Subjective: ?-Reports feeling dyspneic, tired overall, eating very little ? ?Assessment and Plan: ? ?Acute on chronic diastolic CHF (congestive heart failure) ([place]) ?End-stage diastolic CHF ?Pulmonary hypertension with severe RV failure, severe TR ?-Echo with EF of 55 to 60%, RV pressure and volume overload. Moderate reduction in RV systolic dysfunction, RV cavity with severe enlargement, severe TR ?-Right heart cath 4/20 with significantly elevated filling pressures, mixed pulmonary hypertension, moderate to severely reduced cardiac output ?-Diuresed with IV Lasix she is 13.8 L negative, was on milrinone gtt., discontinued 4/27 ?-Creatinine continues to worsen, 3.7 today, she is not a dialysis candidate ?-now on torsemide 60mg  daily ?-Appreciate input from Dr. Tamala Julian and [place] assisting with goals of care, ?-Patient was agreeable to DNR today, still remains resistant to hospice, I think she is actively dying, called and updated her niece Jasmine [name], who understands situation and will attempt to talk to patient and her sister today ? ?Permanent atrial fibrillation ([place]) ?-amio changed to Po ?-Continue Eliquis ? ?AKi on Stage 4 chronic kidney disease ([place]) ?Hyponatremia, non anion gap metabolic acidosis.  ?-Fluctuation in creatinine noted, baseline  creatinine around 1.6, now 3.7, cardiorenal in etiology ?-See discussion above, very poor prognosis, not a dialysis candidate ?-Add Lokelma X1 today ? ?Syncope ?Multifactorial, patient with RV failure and pulmonary hypertension. ?Likely secondary to end stage RV failure.  ?-Also had EEG this admission, negative for seizures ? ?Chronic venous stasis ?-Was briefly on antibiotics, now discontinued ? ?Hypothyroidism ?Continue with levothyroxine, TSH was mildly elevated, needs to be repeated in 6 weeks ? ?DVT prophylaxis: Apixaban ?Code Status: Full code ?Family Communication: no family at bedside, was unable to reach sister Jasmine [name]  ?Disposition Plan: to be determined  ? ?Consultants:  ?Heart failure team ? ?Procedures:  ? ?Antimicrobials:  ? ? ?Objective: ?Vitals:  ? 04/05/22 0415 04/05/22 0700 04/05/22 0754 04/05/22 0845  ?BP: 102/71 (!) 115/92 (!) 115/92 (!) 115/92  ?Pulse: 95 87 87 87  ?Resp: (!) 23 (!) 26 (!) 26 (!) 26  ?Temp: (!) 97.4 ?F (36.3 ?C) (!) 97.4 ?F (36.3 ?C) (!) 97.4 ?F (36.3 ?C) (!) 97.4 ?F (36.3 ?C)  ?TempSrc: Oral Axillary    ?SpO2: 92% 99%    ?Weight: 65.7 kg     ? ? ?Intake/Output Summary (Last 24 hours) at 04/05/2022 1414 ?Last data filed at 04/04/2022 1700 ?Gross per 24 hour  ?Intake 600 ml  ?Output --  ?Net 600 ml  ? ?Filed Weights  ? 04/02/22 0504 04/03/22 0507 04/05/22 0415  ?Weight: 64.3 kg 67 kg 65.7 kg  ? ? ?Examination: ? ?General exam: Chronically ill elderly female sitting up in bed, AAO x2 ?HEENT: Positive JVD ?CVS: S1-S2, irregularly irregular rhythm ?Lungs: Clear bilaterally ?Abdomen: Soft, nontender, bowel sounds present ?Extremities: No edema ?Skin: No rashes ?Psychiatry:  Mood & affect appropriate.  ? ?Data Reviewed:  ? ?CBC: ?Recent Labs  ?Lab 04/04/22 ?0311 04/05/22 ?0401  ?WBC  6.3 6.5  ?HGB 12.4 13.3  ?HCT 37.8 39.3  ?MCV 97.7 96.3  ?PLT 184 178  ? ?Basic Metabolic Panel: ?Recent Labs  ?Lab 03/29/22 ?1528 03/30/22 ?0251 03/31/22 ?0225 04/01/22 ?8315 04/01/22 ?2139 04/02/22 ?1761  04/03/22 ?6073 04/04/22 ?7106 04/05/22 ?0401  ?NA  --    < > 131* 131* 129* 126* 126* 129* 129*  ?K  --    < > 4.7 4.6 4.6 4.4 5.2* 5.0 5.4*  ?CL  --    < > 96* 96* 93* 91* 90* 91* 92*  ?CO2  --    < > 21* 23 21* 22 22 20* 19*  ?GLUCOSE  --    < > 185* 126* 168* 149* 145* 224* 148*  ?BUN  --    < > 60* 65* 63* 60* 64* 70* 89*  ?CREATININE  --    < > 2.16* 2.53* 2.55* 2.36* 2.64* 3.18* 3.70*  ?CALCIUM  --    < > 9.0 8.6* 8.5* 8.6* 8.8* 9.2 9.4  ?MG 2.1  --  2.0 1.9 1.9 2.3  --   --   --   ? < > = values in this interval not displayed.  ? ?GFR: ?Estimated Creatinine Clearance: 10.4 mL/min (A) (by C-G formula based on SCr of 3.7 mg/dL (H)). ?Liver Function Tests: ?Recent Labs  ?Lab 04/04/22 ?0311  ?AST 39  ?ALT 22  ?ALKPHOS 151*  ?BILITOT 4.3*  ?PROT 7.0  ?ALBUMIN 2.8*  ? ?No results for input(s): LIPASE, AMYLASE in the last 168 hours. ?No results for input(s): AMMONIA in the last 168 hours. ?Coagulation Profile: ?No results for input(s): INR, PROTIME in the last 168 hours. ?Cardiac Enzymes: ?No results for input(s): CKTOTAL, CKMB, CKMBINDEX, TROPONINI in the last 168 hours. ?BNP (last 3 results) ?No results for input(s): PROBNP in the last 8760 hours. ?HbA1C: ?No results for input(s): HGBA1C in the last 72 hours. ?CBG: ?No results for input(s): GLUCAP in the last 168 hours. ?Lipid Profile: ?No results for input(s): CHOL, HDL, LDLCALC, TRIG, CHOLHDL, LDLDIRECT in the last 72 hours. ?Thyroid Function Tests: ?No results for input(s): TSH, T4TOTAL, FREET4, T3FREE, THYROIDAB in the last 72 hours. ?Anemia Panel: ?No results for input(s): VITAMINB12, FOLATE, FERRITIN, TIBC, IRON, RETICCTPCT in the last 72 hours. ?Urine analysis: ?   ?Component Value Date/Time  ? COLORURINE AMBER (A) 03/24/2022 0328  ? APPEARANCEUR HAZY (A) 03/24/2022 0328  ? LABSPEC 1.024 03/24/2022 0328  ? PHURINE 5.0 03/24/2022 0328  ? GLUCOSEU NEGATIVE 03/24/2022 0328  ? [place] NEGATIVE 03/24/2022 0328  ? [place] NEGATIVE 03/24/2022 0328  ? [place]  NEGATIVE 03/24/2022 0328  ? PROTEINUR 30 (A) 03/24/2022 0328  ? NITRITE NEGATIVE 03/24/2022 0328  ? LEUKOCYTESUR NEGATIVE 03/24/2022 0328  ? ?Sepsis Labs: ?@[email or web address] (procalcitonin:4,lacticidven:4) ? ?)No results found for this or any previous visit (from the past 240 hour(s)).  ? ?Radiology Studies: ?No results found. ? ? ?Scheduled Meds: ? amiodarone  400 mg Oral BID  ? apixaban  2.5 mg Oral BID  ? docusate sodium  100 mg Oral BID  ? levothyroxine  25 mcg Oral Q0600  ? mexiletine  150 mg Oral Q12H  ? pantoprazole  40 mg Oral Daily  ? sodium chloride flush  3 mL Intravenous Q12H  ? torsemide  60 mg Oral Daily  ? ?Continuous Infusions: ? sodium chloride 10 mL (03/25/22 0222)  ? sodium chloride    ? ? ? LOS: 12 days  ? ? ?Time spent: 71min ? ?Domenic Polite, MD ?[facility] ? ? ?  04/05/2022, 2:14 PM  ?  ?

## 2022-04-05 NOTE — TOC CM/SW Note (Signed)
HF TOC CM spoke to pt's niece, Doroteo Bradford. And they are requesting [facility] for SNF rehab. Message sent to CSW to follow up. Jonnie Finner RN3 CCM, Heart Failure TOC CM [phone]  ?

## 2022-04-05 NOTE — Progress Notes (Signed)
Monitor tech informed this nurse that the patient has had 3 episodes of sinus pause. Patient is currently on the dilaudid drip and has informed on call MD. Pt is currently resting with her eyes closed. Will continue to monitor. ? ? ?

## 2022-04-08 NOTE — Progress Notes (Addendum)
? ? ?  OVERNIGHT PROGRESS REPORT ? ?Notified by RN that patient has expired on Apr 24, 2022 @ 0015 ? ?Patient was DNR and on morphine gtts.  Death was expected. ? ?Family notified by RN. ? ?Kristopher Oppenheim, DO ?[facility] ? ?

## 2022-04-08 NOTE — Progress Notes (Signed)
90 mL of IV dilaudid wasted into stericycle w/ Amedeo Kinsman, RN ?

## 2022-04-08 DEATH — deceased

## 2022-05-09 NOTE — Discharge Summary (Signed)
Death Summary  ?KAYDYN CHISM QAS:[id number] DOB: August 29, 1934 DOA: 04/16/2022 ? ?PCP: [name], MD ? ?Admit date: 04/16/2022 ?Date of Death: 04/30/2022, time of death : 12:15 AM ? ?Final Diagnoses:  ?End-stage diastolic CHF ?Severe pulmonary hypertension ?Severe right ventricular failure ?Acute kidney injury ?CKD 4 ?  Acute on chronic diastolic CHF (congestive heart failure) ([place]) ?  Atrial fibrillation ([place]) ?  Hypertension ?  Stage 4 chronic kidney disease ([place]) ?  Syncope ?  Cellulitis ?  Hypothyroidism ? ? ?History of present illness:  ?87/F with history of chronic diastolic CHF, CKD 4, hypertension, atrial fibrillation, hypothyroidism, vertigo, DVT admitted with end-stage diastolic CHF, severe RV failure ?-Right heart cath noted elevated filling pressures, mixed pulmonary hypertension, moderate to severely reduced cardiac output ?-Started on milrinone and IV Lasix ? ?Hospital Course:  ? ?Acute on chronic diastolic CHF (congestive heart failure) ([place]) ?End-stage diastolic CHF ?Pulmonary hypertension with severe RV failure, severe TR ?-Echo with EF of 55 to 60%, RV pressure and volume overload. Moderate reduction in RV systolic dysfunction, RV cavity with severe enlargement, severe TR ?-Right heart cath 4/20 with significantly elevated filling pressures, mixed pulmonary hypertension, moderate to severely reduced cardiac output ?-Followed by heart failure team, felt to have end-stage diastolic heart failure with severe PAH, diuresed aggressively with IV Lasix and milrinone, this was then discontinued, creatinine continued to trend up she continues to worsen clinically,  due to advanced age frailty she was not felt to be a dialysis candidate  ?-Followed by her primary cardiologist Dr. Tamala Julian and Dr. Haroldine Laws from advanced heart failure team, we strongly recommended hospice to patient and her family on multiple occasions, eventually she was actively dying on 4/28 and finally became agreeable to start some pain  medicines for comfort, she agreed to DNR earlier in the day . ?-Expired on 05/01/23 at 12:15 AM  ?  ?Permanent atrial fibrillation ([place]) ?-Treated with amiodarone and Eliquis ?  ?AKi on Stage 4 chronic kidney disease ([place]) ?Hyponatremia, non anion gap metabolic acidosis.  ?-Fluctuation in creatinine noted, baseline creatinine around 1.6, now 3.7, cardiorenal in etiology ?-See discussion above, very poor prognosis, not a dialysis candidate ?  ?Syncope ?Multifactorial, patient with RV failure and pulmonary hypertension. ?Likely secondary to end stage RV failure.  ?-Also had EEG this admission, negative for seizures ?  ?Chronic venous stasis ?-Was briefly on antibiotics for presumed cellulitis, then discontinued ?  ?Hypothyroidism ?Continue with levothyroxine,  ? ?Time: 89min ? ?Signed: ? ?Domenic Polite  ?[facility] ?04/09/2022, 3:23 PM ? ? ?  ?

## 2022-05-17 ENCOUNTER — Ambulatory Visit: Payer: Medicare Other | Admitting: Podiatry

## 2022-07-17 ENCOUNTER — Ambulatory Visit: Payer: Medicare Other | Admitting: Interventional Cardiology
